# Patient Record
Sex: Female | Born: 1935 | Race: White | Hispanic: No
Health system: Midwestern US, Community
[De-identification: ages and names within clinical notes are randomized; demographics above are authoritative.]

## PROBLEM LIST (undated history)

## (undated) DIAGNOSIS — R112 Nausea with vomiting, unspecified: Secondary | ICD-10-CM

## (undated) DIAGNOSIS — K2950 Unspecified chronic gastritis without bleeding: Secondary | ICD-10-CM

## (undated) DIAGNOSIS — N368 Other specified disorders of urethra: Secondary | ICD-10-CM

## (undated) DIAGNOSIS — Z1239 Encounter for other screening for malignant neoplasm of breast: Secondary | ICD-10-CM

## (undated) DIAGNOSIS — D131 Benign neoplasm of stomach: Secondary | ICD-10-CM

## (undated) DIAGNOSIS — N814 Uterovaginal prolapse, unspecified: Secondary | ICD-10-CM

## (undated) MED ORDER — CYANOCOBALAMIN 1,000 MCG/ML IJ SOLN
1000 mcg/mL | INTRAMUSCULAR | Status: DC
Start: ? — End: 2013-12-03

## (undated) MED ORDER — PANTOPRAZOLE 40 MG TAB, DELAYED RELEASE
40 mg | ORAL_TABLET | Freq: Every day | ORAL | Status: DC
Start: ? — End: 2013-10-14

## (undated) MED ORDER — L-METHYLFOLATE-VITAMIN B12-ACETYLCYSTEINE 5.6 MG-2 MG-600 MG TAB
600-2-6 mg | Freq: Every day | ORAL | Status: DC
Start: ? — End: 2013-11-06

## (undated) MED ORDER — PAROXETINE 20 MG TAB
20 mg | ORAL_TABLET | Freq: Every day | ORAL | Status: DC
Start: ? — End: 2013-11-25

## (undated) MED ORDER — CIPROFLOXACIN 250 MG TAB
250 mg | ORAL_TABLET | Freq: Two times a day (BID) | ORAL | Status: DC
Start: ? — End: 2013-07-28

## (undated) MED ORDER — CIPROFLOXACIN 250 MG TAB
250 mg | ORAL_TABLET | Freq: Two times a day (BID) | ORAL | Status: DC
Start: ? — End: 2013-10-14

## (undated) MED ORDER — DEXTROMETHORPHAN-GUAIFENESIN SR 30 MG-600 MG 12 HR TAB
600-30 mg | ORAL_TABLET | Freq: Two times a day (BID) | ORAL | Status: DC
Start: ? — End: 2013-01-23

## (undated) MED ORDER — ESTRADIOL 0.01% (0.1 MG/G) VAGINAL CREAM
0.01 % (0.1 mg/gram) | VAGINAL | Status: DC
Start: ? — End: 2013-12-18

## (undated) MED ORDER — ERGOCALCIFEROL (VITAMIN D2) 50,000 UNIT CAP
1250 mcg (50,000 unit) | ORAL_CAPSULE | ORAL | Status: DC
Start: ? — End: 2013-06-30

---

## 2009-09-11 ENCOUNTER — Ambulatory Visit: Payer: Self-pay | Admitting: Internal Medicine

## 2009-09-20 ENCOUNTER — Ambulatory Visit: Payer: Self-pay | Admitting: Internal Medicine

## 2010-01-04 ENCOUNTER — Ambulatory Visit: Payer: Self-pay | Admitting: Internal Medicine

## 2010-04-21 ENCOUNTER — Ambulatory Visit: Payer: Self-pay | Admitting: Internal Medicine

## 2010-04-28 ENCOUNTER — Ambulatory Visit: Payer: Self-pay | Admitting: Internal Medicine

## 2010-05-04 ENCOUNTER — Ambulatory Visit: Payer: Self-pay | Admitting: Internal Medicine

## 2011-05-11 ENCOUNTER — Ambulatory Visit: Payer: Self-pay | Admitting: Internal Medicine

## 2011-05-31 ENCOUNTER — Ambulatory Visit: Payer: Self-pay | Admitting: Family Medicine

## 2011-06-14 ENCOUNTER — Ambulatory Visit: Payer: Self-pay | Admitting: Family Medicine

## 2011-07-24 ENCOUNTER — Ambulatory Visit: Payer: Self-pay | Admitting: Gastroenterology

## 2012-02-28 ENCOUNTER — Observation Stay: Payer: Self-pay | Admitting: Internal Medicine

## 2012-02-28 LAB — CK TOTAL AND CKMB (NOT AT ARMC)
CK, Total: 37 U/L (ref 21–215)
CK-MB: 0.5 ng/mL (ref 0.5–3.6)
CK-MB: 0.8 ng/mL (ref 0.5–3.6)

## 2012-02-28 LAB — CBC
HCT: 40.5 % (ref 35.0–47.0)
HGB: 14.1 g/dL (ref 12.0–16.0)
MCHC: 34.9 g/dL (ref 32.0–36.0)
MCV: 86 fL (ref 80–100)

## 2012-02-28 LAB — T4, FREE: Free Thyroxine: 2.14 ng/dL — ABNORMAL HIGH (ref 0.76–1.46)

## 2012-02-28 LAB — BASIC METABOLIC PANEL
Anion Gap: 10 (ref 7–16)
Co2: 20 mmol/L — ABNORMAL LOW (ref 21–32)
Creatinine: 1.21 mg/dL (ref 0.60–1.30)
EGFR (African American): 49 — ABNORMAL LOW
EGFR (Non-African Amer.): 42 — ABNORMAL LOW
Glucose: 99 mg/dL (ref 65–99)
Osmolality: 288 (ref 275–301)
Sodium: 143 mmol/L (ref 136–145)

## 2012-02-28 LAB — TROPONIN I
Troponin-I: <0.02 ng/mL
Troponin-I: <0.02 ng/mL

## 2012-02-29 LAB — CBC WITH DIFFERENTIAL/PLATELET
Basophil #: 0 10*3/uL (ref 0.0–0.1)
Eosinophil %: 2.1 %
HCT: 37.5 % (ref 35.0–47.0)
Lymphocyte #: 1.3 10*3/uL (ref 1.0–3.6)
Lymphocyte %: 32.6 %
MCH: 30.1 pg (ref 26.0–34.0)
MCV: 86 fL (ref 80–100)
Monocyte %: 12.9 %
Neutrophil %: 51.9 %
RDW: 12.9 % (ref 11.5–14.5)

## 2012-02-29 LAB — MAGNESIUM: Magnesium: 2 mg/dL

## 2012-02-29 LAB — BASIC METABOLIC PANEL
Anion Gap: 6 — ABNORMAL LOW (ref 7–16)
Calcium, Total: 8.1 mg/dL — ABNORMAL LOW (ref 8.5–10.1)
Chloride: 112 mmol/L — ABNORMAL HIGH (ref 98–107)
Co2: 25 mmol/L (ref 21–32)
Creatinine: 0.83 mg/dL (ref 0.60–1.30)
Glucose: 82 mg/dL (ref 65–99)
Osmolality: 286 (ref 275–301)
Potassium: 4.3 mmol/L (ref 3.5–5.1)

## 2012-02-29 LAB — CK TOTAL AND CKMB (NOT AT ARMC): CK, Total: 35 U/L (ref 21–215)

## 2012-02-29 LAB — TROPONIN I: Troponin-I: <0.02 ng/mL

## 2012-06-19 ENCOUNTER — Ambulatory Visit: Payer: Self-pay

## 2012-08-20 ENCOUNTER — Ambulatory Visit: Payer: Self-pay | Admitting: Gastroenterology

## 2012-09-23 ENCOUNTER — Ambulatory Visit: Payer: Self-pay | Admitting: Gastroenterology

## 2012-09-26 ENCOUNTER — Ambulatory Visit: Payer: Self-pay

## 2012-10-08 LAB — PATHOLOGY REPORT

## 2012-10-11 ENCOUNTER — Ambulatory Visit: Payer: Self-pay | Admitting: Oncology

## 2012-10-11 LAB — COMPREHENSIVE METABOLIC PANEL
Alkaline Phosphatase: 93 U/L (ref 50–136)
Anion Gap: 7 (ref 7–16)
Calcium, Total: 8.6 mg/dL (ref 8.5–10.1)
Co2: 27 mmol/L (ref 21–32)
EGFR (African American): 51 — ABNORMAL LOW
EGFR (Non-African Amer.): 44 — ABNORMAL LOW
Osmolality: 290 (ref 275–301)
SGOT(AST): 22 U/L (ref 15–37)
SGPT (ALT): 17 U/L (ref 12–78)
Sodium: 145 mmol/L (ref 136–145)

## 2012-10-11 LAB — CBC CANCER CENTER
Basophil #: 0 x10 3/mm (ref 0.0–0.1)
HCT: 41.5 % (ref 35.0–47.0)
HGB: 14.2 g/dL (ref 12.0–16.0)
Lymphocyte %: 22.9 %
MCH: 29.3 pg (ref 26.0–34.0)
MCHC: 34.2 g/dL (ref 32.0–36.0)
MCV: 86 fL (ref 80–100)
Monocyte #: 0.4 x10 3/mm (ref 0.2–0.9)
Monocyte %: 8.6 %
Neutrophil #: 2.8 x10 3/mm (ref 1.4–6.5)
Neutrophil %: 67.4 %
Platelet: 229 x10 3/mm (ref 150–440)
RDW: 13.6 % (ref 11.5–14.5)
WBC: 4.2 x10 3/mm (ref 3.6–11.0)

## 2012-10-26 ENCOUNTER — Ambulatory Visit: Payer: Self-pay | Admitting: Oncology

## 2012-11-14 ENCOUNTER — Inpatient Hospital Stay: Payer: Self-pay | Admitting: Orthopedic Surgery

## 2012-11-14 LAB — PROTIME-INR
INR: 1
Prothrombin Time: 13.1 secs (ref 11.5–14.7)

## 2012-11-14 LAB — BASIC METABOLIC PANEL
Calcium, Total: 8.1 mg/dL — ABNORMAL LOW (ref 8.5–10.1)
Chloride: 112 mmol/L — ABNORMAL HIGH (ref 98–107)
Co2: 21 mmol/L (ref 21–32)
Creatinine: 0.87 mg/dL (ref 0.60–1.30)
EGFR (African American): >60
EGFR (Non-African Amer.): >60
Potassium: 3.8 mmol/L (ref 3.5–5.1)
Sodium: 144 mmol/L (ref 136–145)

## 2012-11-14 LAB — CBC WITH DIFFERENTIAL/PLATELET
Basophil #: 0 10*3/uL (ref 0.0–0.1)
Basophil %: 0.2 %
Eosinophil #: 0 10*3/uL (ref 0.0–0.7)
Eosinophil %: 0.5 %
HCT: 39.8 % (ref 35.0–47.0)
Lymphocyte #: 0.8 10*3/uL — ABNORMAL LOW (ref 1.0–3.6)
Lymphocyte %: 9.2 %
MCV: 86 fL (ref 80–100)
Monocyte %: 6.4 %
Neutrophil #: 7.4 10*3/uL — ABNORMAL HIGH (ref 1.4–6.5)
Neutrophil %: 83.7 %
Platelet: 223 10*3/uL (ref 150–440)
RDW: 13.4 % (ref 11.5–14.5)

## 2012-11-14 LAB — CK TOTAL AND CKMB (NOT AT ARMC)
CK, Total: 60 U/L (ref 21–215)
CK-MB: <0.5 ng/mL — ABNORMAL LOW (ref 0.5–3.6)

## 2012-11-14 LAB — TROPONIN I: Troponin-I: <0.02 ng/mL

## 2012-11-14 LAB — TSH: Thyroid Stimulating Horm: 3.08 u[IU]/mL

## 2012-11-15 LAB — URINALYSIS, COMPLETE
Bilirubin,UR: NEGATIVE
Glucose,UR: NEGATIVE mg/dL (ref 0–75)
Nitrite: POSITIVE
Protein: 30
RBC,UR: 161 /HPF (ref 0–5)

## 2012-11-15 LAB — BASIC METABOLIC PANEL
Anion Gap: 8 (ref 7–16)
Chloride: 107 mmol/L (ref 98–107)
Creatinine: 0.7 mg/dL (ref 0.60–1.30)
EGFR (African American): >60
EGFR (Non-African Amer.): >60
Osmolality: 272 (ref 275–301)
Potassium: 3.6 mmol/L (ref 3.5–5.1)

## 2012-11-15 LAB — CBC WITH DIFFERENTIAL/PLATELET
Basophil #: 0 10*3/uL (ref 0.0–0.1)
Basophil %: 0.4 %
Eosinophil %: 2 %
HCT: 35.4 % (ref 35.0–47.0)
Lymphocyte #: 0.9 10*3/uL — ABNORMAL LOW (ref 1.0–3.6)
Lymphocyte %: 14.4 %
Monocyte %: 8.7 %
Neutrophil #: 4.4 10*3/uL (ref 1.4–6.5)
Neutrophil %: 74.5 %
Platelet: 167 10*3/uL (ref 150–440)
RDW: 13.2 % (ref 11.5–14.5)

## 2012-11-15 LAB — HEMOGLOBIN: HGB: 10.8 g/dL — ABNORMAL LOW (ref 12.0–16.0)

## 2012-11-16 LAB — CBC WITH DIFFERENTIAL/PLATELET
Basophil #: 0 10*3/uL (ref 0.0–0.1)
Basophil %: 0.2 %
Eosinophil %: 0.1 %
HCT: 28.2 % — ABNORMAL LOW (ref 35.0–47.0)
Lymphocyte %: 9.9 %
MCHC: 35.2 g/dL (ref 32.0–36.0)
MCV: 85 fL (ref 80–100)
Monocyte #: 0.7 x10 3/mm (ref 0.2–0.9)
Monocyte %: 8.9 %
Neutrophil #: 6.7 10*3/uL — ABNORMAL HIGH (ref 1.4–6.5)
RBC: 3.32 10*6/uL — ABNORMAL LOW (ref 3.80–5.20)
RDW: 13.9 % (ref 11.5–14.5)
WBC: 8.3 10*3/uL (ref 3.6–11.0)

## 2012-11-16 LAB — BASIC METABOLIC PANEL
BUN: 16 mg/dL (ref 7–18)
Calcium, Total: 7 mg/dL — CL (ref 8.5–10.1)
Chloride: 108 mmol/L — ABNORMAL HIGH (ref 98–107)
EGFR (African American): 54 — ABNORMAL LOW
Glucose: 135 mg/dL — ABNORMAL HIGH (ref 65–99)
Osmolality: 275 (ref 275–301)
Potassium: 4.1 mmol/L (ref 3.5–5.1)
Sodium: 136 mmol/L (ref 136–145)

## 2012-11-17 LAB — BASIC METABOLIC PANEL
Anion Gap: 8 (ref 7–16)
Calcium, Total: 7.2 mg/dL — ABNORMAL LOW (ref 8.5–10.1)
Chloride: 106 mmol/L (ref 98–107)
Creatinine: 0.94 mg/dL (ref 0.60–1.30)
Osmolality: 268 (ref 275–301)
Sodium: 133 mmol/L — ABNORMAL LOW (ref 136–145)

## 2012-11-17 LAB — HEPATIC FUNCTION PANEL A (ARMC)
Alkaline Phosphatase: 79 U/L (ref 50–136)
Bilirubin, Direct: 0.3 mg/dL — ABNORMAL HIGH (ref 0.00–0.20)
Bilirubin,Total: 0.7 mg/dL (ref 0.2–1.0)
Total Protein: 4.4 g/dL — ABNORMAL LOW (ref 6.4–8.2)

## 2012-11-17 LAB — HEMOGLOBIN: HGB: 9.6 g/dL — ABNORMAL LOW (ref 12.0–16.0)

## 2012-11-18 LAB — CBC WITH DIFFERENTIAL/PLATELET
Basophil #: 0 10*3/uL (ref 0.0–0.1)
Basophil %: 0.1 %
Eosinophil #: 0.1 10*3/uL (ref 0.0–0.7)
Eosinophil %: 2.5 %
HCT: 25.1 % — ABNORMAL LOW (ref 35.0–47.0)
MCH: 29 pg (ref 26.0–34.0)
MCHC: 34.6 g/dL (ref 32.0–36.0)
MCV: 84 fL (ref 80–100)
Monocyte %: 14.7 %
Platelet: 135 10*3/uL — ABNORMAL LOW (ref 150–440)
RDW: 13.6 % (ref 11.5–14.5)
WBC: 5.2 10*3/uL (ref 3.6–11.0)

## 2012-12-24 NOTE — Patient Instructions (Signed)
How to Get up Safely After a Fall: After Your Visit  Your Care Instructions  If you have injuries, health problems, or other reasons that may make it easy for you to fall at home, it is a good idea to learn how to get up safely after a fall. Learning how to get up correctly can help you avoid making an injury worse.  Also, knowing what to do if you cannot get up can help you stay safe until help arrives.  Follow-up care is a key part of your treatment and safety. Be sure to make and go to all appointments, and call your doctor if you are having problems. It's also a good idea to know your test results and keep a list of the medicines you take.  How can you care for yourself after a fall?  If you think you can get up  First lie still for a few minutes and think about how you feel. If your body feels okay and you think you can get up safely, follow the rest of the steps below:  1. Look for a chair or other piece of furniture that is close to you.  2. Roll onto your side and rest. Roll by turning your head in the direction you want to roll, move your shoulder and arm, then hip and leg in the same direction.  3. Lie still for a moment to let your blood pressure adjust.  4. Slowly push your upper body up, lift your head, and take a moment to rest.  5. Slowly get up on your hands and knees, and crawl to the chair or other stable piece of furniture.  6. Put your hands on the chair.  7. Move one foot forward, and place it flat on the floor. Your other leg should be bent with the knee on the floor.  8. Rise slowly, turn your body, and sit in the chair. Stay seated for a bit and think about how you feel. Call for help. Even if you feel okay, let someone know what happened to you. You might not know that you have a serious injury.  If you cannot get up  1. If you think you are injured after a fall or you cannot get up, try not to panic.  2. Call out for help.  3. If you have a phone within reach or you have an emergency call  device, use it to call for help.  4. If you do not have a phone within reach, try to slide yourself toward it. If you cannot get to the phone, try to slide toward a door or window or a place where you think you can be heard.  5. Yell or use an object to make noise so someone might hear you.  6. If you can reach something that you can use for a pillow, place it under your head. Try to stay warm by covering yourself with a blanket or clothing while you wait for help.  When should you call for help?  Call 911 anytime you think you may need emergency care. For example, call if:  ?? You passed out (lost consciousness).  ?? You cannot get up after a fall.  ?? You believe you have serious or life-threatening injuries.  Call your doctor now or seek immediate medical care if:  ?? You have severe pain.  ?? You think you may have passed out but are not sure.  ?? You hit your head or think   you may have hit your head but are not sure.  ?? You think your medicines may have caused you to fall.  Watch closely for changes in your health, and be sure to contact your doctor if:  ?? You have fallen, even if you think you are not hurt. Do not feel embarrassed to let your doctor know you have fallen. Your doctor may be able to adjust your medicines or provide other help so you can prevent future falls.   Where can you learn more?   Go to http://www.healthwise.net/[facility]  Enter G513 in the search box to learn more about "How to Get up Safely After a Fall: After Your Visit."   ?? 2006-2014 Healthwise, Inc[employer]. Care instructions adapted under license by [facility] (which disclaims liability or warranty for this information). This care instruction is for use with your licensed healthcare professional. If you have questions about a medical condition or this instruction, always ask your healthcare professional. Healthwise, Incorporated disclaims any warranty or liability for your use of this information.  Content Version: 10.0.270728; Last  Revised: April 30, 2012

## 2012-12-24 NOTE — Progress Notes (Signed)
[facility]  Bran Aldridge C. Jo Simmons, M.D.  Jo Simmons, M.D.  [address]  [place],  [place] [address]  Ph No:  [phone]  Fax:  [phone]      CHIEF COMPLAINT:  New Patient visit         HISTORY OF PRESENT ILLNESS: Jo Simmons is a 79 y.o. female with Past medical history positive for memory loss, depression, hypothyroidism, history of multiple neuroendocrine tumors and stomach, osteoarthritis, gastritis, COPD, cardiomegaly, pernicious anemia, GERD, heart, or hernia.  History of recent fractured and currently in a rehabilitation facility was advised to get established primary care physician.  According to daughter, she believed in [place] recently moved to [place].  Daughter has been the primary caretaker for her and they would like to get established with primary care physician.  She has a history of fractured off her  Right hip hemiarthroplasty.    HISTORY:  No Known Allergies  Past Medical History   Diagnosis Date   ??? Anemia    ??? Chronic obstructive pulmonary disease    ??? Thyroid disease    ??? GERD (gastroesophageal reflux disease)    ??? Depression    ??? Arthritis      Past Surgical History   Procedure Laterality Date   ??? Hx hip replacement       right side   ??? Hx endoscopy     ??? Hx urological     ??? Hx orthopaedic     ??? Hx gi     ??? Pr cardiac surg procedure unlist       Family History   Problem Relation Age of Onset   ??? Alcohol abuse Father      History     Social History   ??? Marital Status: WIDOWED     Spouse Name: N/A     Number of Children: N/A   ??? Years of Education: N/A     Occupational History   ??? Not on file.     Social History Main Topics   ??? Smoking status: Never Smoker    ??? Smokeless tobacco: Not on file   ??? Alcohol Use: Yes      Comment: social   ??? Drug Use: No   ??? Sexually Active: Not on file     Other Topics Concern   ??? Not on file     Social History Narrative   ??? No narrative on file     Current Outpatient Prescriptions    Medication Sig Dispense Refill   ??? alendronate (FOSAMAX) 70 mg tablet Take 70 mg by mouth every Wednesday.       ??? omeprazole (PRILOSEC) 20 mg capsule Take 20 mg by mouth daily.       ??? phenazopyridine (PYRIDIUM) 100 mg tablet Take 100 mg by mouth two (2) times a day.       ??? magnesium hydroxide (PHILLIPS MILK OF MAGNESIA) 400 mg/5 mL suspension Take 30 mL by mouth daily as needed for Constipation.       ??? ondansetron hcl (ZOFRAN) 4 mg tablet Take 4 mg by mouth every eight (8) hours as needed for Nausea.       ??? polyethylene glycol (MIRALAX) 17 gram packet Take 17 g by mouth daily.       ??? acetaminophen (TYLENOL ARTHRITIS PAIN) 650 mg CR tablet Take 650 mg by mouth every six (6) hours as needed for Pain.       ???  bisacodyl (DULCOLAX) 10 mg suppository Insert 10 mg into rectum daily.       ??? calcium carbonate (CALTREX) 600 mg (1,500 mg) tablet Take 600 mg by mouth two (2) times a day.       ??? Cholecalciferol, Vitamin D3, 50,000 unit cap Take  by mouth.       ??? cyanocobalamin (VITAMIN B12) 1,000 mcg/mL injection 1,000 mcg by IntraMUSCular route every thirty (30) days.       ??? docusate calcium (SURFAK) 240 mg capsule Take 240 mg by mouth nightly as needed for Constipation.       ??? donepezil (ARICEPT) 5 mg tablet Take  by mouth nightly.       ??? enoxaparin (LOVENOX) 40 mg/0.4 mL 40 mg by SubCUTAneous route daily.       ??? ferrous sulfate 325 mg (65 mg iron) tablet Take 325 mg by mouth two (2) times a day.       ??? HYDROcodone-acetaminophen (NORCO) 5-325 mg per tablet Take  by mouth.       ??? albuterol-ipratropium (DUO-NEB) 2.5 mg-0.5 mg/3 ml nebulizer solution 3 mL by Nebulization route once. As needed.       ??? levothyroxine (SYNTHROID) 50 mcg tablet Take  by mouth Daily (before breakfast).       ??? MULTIVITAMIN WITH MINERALS (ONE-A-DAY 50 PLUS PO) Take  by mouth.       ??? PARoxetine (PAXIL) 20 mg tablet Take  by mouth daily.                 REVIEW OF SYSTEMS:    GENERAL/CONSTITUTIONAL: Negative for  - chills, fatigue, fever,  night sweats, sleep disturbance, weight gain, weight loss  HEAD, EYES, EARS, NOSE AND THROAT: Negative for - epistaxis, headaches, hearing change, nasal congestion, nasal discharge, oral lesions, sinus pain, sneezing, sore throat, tinnitus, vertigo, visual changes, vocal changes  CARDIOVASCULAR: Negative for - chest pain, edema, irregular heartbeat, loss of consciousness, orthopnea, palpitations, paroxysmal nocturnal dyspnea, rapid heart rate, shortness of breath on exertion.  RESPIRATORY:  Negative for - cough, hemoptysis, orthopnea, pleuritic pain, shortness of breath, sputum changes, stridor, tachypnea, wheezing  GASTROINTESTINAL: Negative for - abdominal pain, appetite loss, blood in stools, change in bowel habits, change in stools, constipation, diarrhea, gas/bloating, heartburn, hematemesis, melena, nausea/vomiting, stool incontinence, swallowing difficulty/pain  GENITOURINARY: Negative for - Urinary frequency and uti recurrent   MUSCULOSKELETAL: right hip pain, improving, getting physical therapy at a rehabilitation facility  SKIN:  Negative for - acne, dry skin, eczema, hair changes, lumps, mole changes, nail changes, pruritus, rash, skin lesion changes  NEUROLOGIC: Negative for - behavioral changes, bowel and bladder control changes, confusion, dizziness, gait disturbance, headaches, impaired coordination/balance, memory loss, numbness/tingling, seizures, speech problems, tremors, visual changes, weakness  PSYCHIATRIC: Negative for - anxiety, behavioral disorder, concentration difficulties, depression, disorientation, hallucinations, irritability, memory difficulties, mood swings, obsessive thoughts, physical abuse, sexual abuse, sleep disturbances, suicidal ideation  ENDOCRINE:  Negative for - breast changes, galactorrhea, hair pattern changes, hot flashes, malaise/lethargy, mood swings, palpitations, polydipsia/polyuria, skin changes, temperature intolerance, unexpected weight changes   HEMATOLOGIC/LYMPHATIC:  Negative for - bleeding problems, blood clots, blood transfusions, bruising, fatigue, jaundice, night sweats, pallor, swollen lymph nodes, weight loss   ALLERGIC/IMMUNOLOGIC:  Negative for - hives, insect bite sensitivity, itchy/watery eyes, nasal congestion, postnasal drip, seasonal allergies             PHYSICAL EXAM:  Vital Signs - BP 126/81   Pulse 86   Ht 5\' 7"  (1.702 m)  Wt 143 lb (64.864 kg)   BMI 22.39 kg/m2   Constitutional - alert, well appearing, and in no distress. Uses walker for ambulation  Eyes - pupils equal and reactive, extraocular eye movements intact.  Ear, Nose, Mouth, Throat - external inspection of ears and nose is normal   Neck - supple, no significant adenopathy. No thyromegaly.  Respiratory - clear to auscultation, no wheezes, rales or bronchi, symmetric air entry.  Cardiovascular - normal rate, regular rhythm, normal S1, S2, systolic 2/6 murmurs, rubs, clicks or gallops, pedal pulses, extremities for edema and/or varicosities.  Gastrointestinal - Abdomen soft, non tender, non distended, no masses.  Examination of liver and spleen    Back exam - full range of motion, no tenderness, palpable spasm or pain on motion  Lymphatic - No lymphadenopathy noted.  Musculoskeletal - No joint tenderness, deformity or swelling. Right hip.  Range of motion appeared to be almost normal  Skin - normal coloration and turgor, no rashes, no suspicious skin lesions noted. Several SK lesions evident  Neurological - Cranial nerves with notation of any deficits. Motor sensory and gait is normal . Speech is normal  Extremities - peripheral pulses normal, no pedal edema, no clubbing or cyanosis  Psychiatric - alert, oriented to person, place, and time.    LABS  No results found for this or any previous visit.            IMPRESSION  Encounter Diagnoses     ICD-9-CM   1. Hypothyroidism 244.9   2. B12 deficiency 266.2   3. Depression 311   4. Hip fx 820.8   5. COPD (chronic obstructive  pulmonary disease) 496   6. Esophageal reflux 530.81   7. DJD (degenerative joint disease) 715.90       PLAN :   I discussed with family.  Considering her fracture and been on Fosamax for a long time best option is to stop the therapy also explained about continuing her medication.  She is also advised to have B12 injection once a month.  She will continue with her current medication.  Her appetite has been poor, probably change in her status, moving from under [place] as well as recent surgery may be playing a role.  Advised that we could switch her over to paxil to remeron, depending on how she improves she will stay with the low-salt, low-cholesterol diet.  Continue his physical therapy follow-up was recommended.  We reviewed her medication.  Discussed regarding her conditions in detail.  Continue current medication.  Under care by dr. Rema Jasmine at rehabilitation facility.  Continue physical therapy, occupational therapy as needed.  Continue Colace as needed.  She had a blood work done at the facility, but I do not see the results.  Advised daughter to bring those results to [place] during next visit.  Follow-up is recommended.  See orders and medication list                Jo Kern, MD          Dictated using voice recognition software. Proofread, but unrecognized voice recognition errors may exist.

## 2013-01-02 ENCOUNTER — Other Ambulatory Visit

## 2013-01-02 LAB — AMB POC COMPREHENSIVE METABOLIC PANEL
ALBUMIN: 3.7 g/dL (ref 3.2–5.5)
ALKALINE PHOS: 93 IU/L (ref 42–128)
ALT (POC): 16 IU/L (ref 10–60)
AST (POC): 24 IU/L (ref 10–42)
BUN: 13 mg/dL (ref 7–18)
CALCIUM: 9.5 mg/dL (ref 8–10.4)
CHLORIDE: 106.7 mmol/L (ref 96–118)
CO2: 26.7 mmol/L (ref 21–35)
Creatinine (POC): 0.9 mg/dL (ref 0.6–1.3)
GLUCOSE: 92 mg/dL (ref 70–105)
Potassium (POC): 4.39 mmol/L (ref 3.5–5.1)
Sodium (POC): 141.8 mmol/L (ref 134–146)
TOTAL BILIRUBIN: 0.8 mg/dL (ref 0.2–1)
TOTAL PROTEIN: 6 g/dL — AB (ref 6.1–8.2)

## 2013-01-02 LAB — AMB POC LIPID PROFILE
Cholesterol (POC): 188
HDL Cholesterol (POC): 52.1
LDL Cholesterol (POC): 103
TChol/HDL Ratio (POC): 1.6
Triglycerides (POC): 164

## 2013-01-02 LAB — AMB POC TSH: TSH POC: 3.05 u[IU]/mL (ref 0.54–5.2)

## 2013-01-02 LAB — AMB POC T4: T4 POC: 8.76 ug/dl (ref 4.9–12)

## 2013-01-02 LAB — AMB POC HELICOBACTER PYLORI: H. pylori (POC): NEGATIVE

## 2013-01-03 LAB — MAGNESIUM: Magnesium: 2.2 mg/dL (ref 1.6–2.6)

## 2013-01-03 LAB — VITAMIN B12: Vitamin B12: 516 pg/mL (ref 211–946)

## 2013-01-03 NOTE — Progress Notes (Signed)
Sent referral electronically to [facility]

## 2013-01-03 NOTE — Progress Notes (Signed)
CHIEF COMPLAINT:   pmb    HISTORY OF PRESENT ILLNESS:   79 yo wf for new pt visit. In rehab for last few months due to hip fxn, noted pmb and seen by gyn.  Noted vaginal lesion, referred.  No further wu to this point, bleeding light.  No hx gyn surgery.    Review of Systems: except as above.  Constitutional Denies fever or chills.  Denies weight loss or appetite changes.   ENT and Mouth Denies hearing loss, sinusitis, sore throat, oral cavities, ulcers, tinnitus, headache   Gastrointestinal Denies nausea, vomiting, bowel changes.  Denies bloody or black stools.   Integumentary Denies lesions or rashes, moles, dryness, lumps, pigmentation, mastalgia, discharge, masses, ulcers   Endocrine Denies polyuria, polydipsia, cold - heat intolerance, diabetes, hot flashes, hypothyroid, hyperthyroid    Genitourinary Denies hematuria, nocturia, menopause, hernia, dysuria, urgency, frequency, incomplete emptying, abnormal bleeding   Hem / Lymph Denies anemia, bruising, bleeding, lymph node enlargement, adnopathy    Eyes Denies diplopia, blurred vision, vision change, glasses / contacts   Cardiovascular Denies chest pain or pressure, palpitations, murmur, hypertension, orthopenea   Musculoskeletal  Denies arthritis, joint stiffness, swelling, myalgias, gout, weakness   Neurologic Denies dizziness, syncope, seizures, vertigo, weakness, tremor, numbness, trouble walking   Respiratory Denies shortness of breath, cough, sputum production.   Allergy / Immunologic  Denies allergies to medicine, food dyes, hepatitis, HIV   Psychiatric Denies depression, agitation, panic-anxiety, memory disturbance       No Known Allergies  Past Medical History   Diagnosis Date   ??? Anemia    ??? Chronic obstructive pulmonary disease    ??? GERD (gastroesophageal reflux disease)    ??? Depression    ??? Arthritis    ??? Thyroid disease      hypothyroid   ??? Congestive heart failure, unspecified    ??? Cardiomegaly    ??? HH (hiatus hernia)    ??? Psychotic disorder       memory loss- short term     Past Surgical History   Procedure Laterality Date   ??? Hx hip replacement       right side   ??? Hx endoscopy     ??? Hx urological     ??? Hx gi     ??? Pr cardiac surg procedure unlist     ??? Hx orthopaedic       right hip replacement   ??? Hx appendectomy     ??? Hx tonsillectomy       Family History   Problem Relation Age of Onset   ??? Alcohol abuse Father      History     Social History   ??? Marital Status: UNKNOWN     Spouse Name: N/A     Number of Children: N/A   ??? Years of Education: N/A     Occupational History   ??? Not on file.     Social History Main Topics   ??? Smoking status: Never Smoker    ??? Smokeless tobacco: Not on file   ??? Alcohol Use: Yes      Comment: social   ??? Drug Use: No   ??? Sexually Active: Not on file     Other Topics Concern   ??? Not on file     Social History Narrative   ??? No narrative on file      Current Outpatient Prescriptions   Medication Sig Dispense Refill   ??? alendronate (FOSAMAX) 70 mg tablet  Take 70 mg by mouth every Wednesday.       ??? omeprazole (PRILOSEC) 20 mg capsule Take 20 mg by mouth daily.       ??? phenazopyridine (PYRIDIUM) 100 mg tablet Take 100 mg by mouth two (2) times a day.       ??? magnesium hydroxide (PHILLIPS MILK OF MAGNESIA) 400 mg/5 mL suspension Take 30 mL by mouth daily as needed for Constipation.       ??? ondansetron hcl (ZOFRAN) 4 mg tablet Take 4 mg by mouth every eight (8) hours as needed for Nausea.       ??? polyethylene glycol (MIRALAX) 17 gram packet Take 17 g by mouth daily.       ??? acetaminophen (TYLENOL ARTHRITIS PAIN) 650 mg CR tablet Take 650 mg by mouth every six (6) hours as needed for Pain.       ??? bisacodyl (DULCOLAX) 10 mg suppository Insert 10 mg into rectum daily.       ??? calcium carbonate (CALTREX) 600 mg (1,500 mg) tablet Take 600 mg by mouth two (2) times a day.       ??? Cholecalciferol, Vitamin D3, 50,000 unit cap Take  by mouth.       ??? cyanocobalamin (VITAMIN B12) 1,000 mcg/mL injection 1,000 mcg by IntraMUSCular route every  thirty (30) days.       ??? docusate calcium (SURFAK) 240 mg capsule Take 240 mg by mouth nightly as needed for Constipation.       ??? donepezil (ARICEPT) 5 mg tablet Take  by mouth nightly.       ??? enoxaparin (LOVENOX) 40 mg/0.4 mL 40 mg by SubCUTAneous route daily.       ??? ferrous sulfate 325 mg (65 mg iron) tablet Take 325 mg by mouth two (2) times a day.       ??? HYDROcodone-acetaminophen (NORCO) 5-325 mg per tablet Take  by mouth.       ??? albuterol-ipratropium (DUO-NEB) 2.5 mg-0.5 mg/3 ml nebulizer solution 3 mL by Nebulization route once. As needed.       ??? levothyroxine (SYNTHROID) 50 mcg tablet Take  by mouth Daily (before breakfast).       ??? MULTIVITAMIN WITH MINERALS (ONE-A-DAY 50 PLUS PO) Take  by mouth.       ??? PARoxetine (PAXIL) 20 mg tablet Take  by mouth daily.           OBJECTIVE:  BP 133/84   Pulse 88   Ht 5\' 7"  (1.702 m)   Wt 141 lb (63.957 kg)   BMI 22.08 kg/m2   SpO2 98%    Performance Status:  1    Physical Exam:   General Alerted    Eyes No variations with conjunctivae, lids, pupils, irises    Ears, Nose, mouth, Throat No variations with external, ears, nose, lips, teeth, or gums   Neck No variations with neck and thyroid   Respiratory No variations with respiratory effort, palpitation of chest, or ausculation of lungs   Cardio Regular rate and rhythm, no murmurs, gallops or rubs, normal extremities for edemia / variocosities       Abdomen Soft, non-tender, non-distended; bowel sounds present; no hepatosplenomegaly   Genitourinary (Pelvic examination) Normal external genitalia, vagina, bladder, cervix, uterus, adnexa / parametria, no CVA tenderness  Urethra with erethmatous area, prolapse??   Lymphatic Normal Neck, axilla, groin    Musculoskeletal Normal gait and station. Normal range of motion,assessment of stability, muscle strength / tone, spine NT  Skin Normal skin    Neurologic  Normal cranial nerves    Psychiatric  Alert to time, place and person, place and person, mood and affect        Labs:  Recent Results (from the past 24 hour(s))   MAGNESIUM    Collection Time     01/02/13  8:22 AM       Result Value Range    Magnesium 2.2  1.6 - 2.6 mg/dL   VITAMIN Z30    Collection Time     01/02/13  8:22 AM       Result Value Range    Vitamin B12 516  211 - 946 pg/mL   AMB POC COMPREHENSIVE METABOLIC PANEL    Collection Time     01/02/13  1:00 PM       Result Value Range    Sodium (POC) 141.8  134 - 146 mmol/L    Potassium (POC) 4.39  3.5 - 5.1 mmol/L    CHLORIDE 106.7  96 - 118 mmol/L    CO2 26.7  21 - 35 mmol/L    GLUCOSE 92  70 - 105 mg/dL    BUN 13  7 - 18 mg/dL    CREATININE 0.9  0.6 - 1.3 mg/dL    ALBUMIN 3.7  3.2 - 5.5 g/dL    ALKALINE PHOS 93  42 - 128 IU/L    AST (POC) 24  10 - 42 IU/L    ALT (POC) 16  10 - 60 IU/L    TOTAL BILIRUBIN 0.8  0.2 - 1 mg/dL    TOTAL PROTEIN 6.0 (*) 6.1 - 8.2 g/dL    CALCIUM 9.5  8 - 86.5 mg/dL   AMB POC TSH    Collection Time     01/02/13  1:00 PM       Result Value Range    TSH POC 3.05  0.54 - 5.2 uIU/ml   AMB POC T4    Collection Time     01/02/13  1:00 PM       Result Value Range    T4 POC 8.76  4.9 - 12 ug/dl   AMB POC LIPID PROFILE    Collection Time     01/02/13  1:00 PM       Result Value Range    Cholesterol (POC) 188      Triglycerides (POC) 164      HDL Cholesterol (POC) 52.1      LDL Cholesterol (POC) 103      Non-HDL Goal (POC)        TChol/HDL Ratio (POC) 1.6     AMB POC HELICOBACTER PYLORI    Collection Time     01/02/13  1:00 PM       Result Value Range    H. pylori (POC) NEG           ASSESSMENT:    ICD-9-CM    1. Uterine prolapse 618.1 REFERRAL TO UROLOGY     PAP, LIQUID BASED, IMAGE PROCESSED     Specialty Problems    None            PLAN:  Discussed the patient's BMI with her.  The BMI follow up plan is as follows: Diet and exercise    Impression at this point, non gyn and possibly benign urethral prolapse, referr to urology  Recommended ensring colonosocopy within last few years, proceed iif not  Pap or area done.  [email or web address]  [facility]  [address]  [place] [address]  Office : [phone]  Fax : [phone]

## 2013-01-07 NOTE — Progress Notes (Signed)
[facility], spoke to Justin.  Asked her if she got the electronic referral through [facility].  She did and said they are a week behind on their referrals.  I called the patient's daughter and let her know.

## 2013-01-09 NOTE — Progress Notes (Signed)
[facility]  Johnathin Vanderschaaf C. Allena Katz, M.D.  Deloris Ping, M.D.  [address]  [place], [place] [address]  Ph No:  [phone]  Fax:  [phone]      CHIEF COMPLAINT: follow-up visit         HISTORY OF PRESENT ILLNESS: [name] is a 79 y.o. female with past medical history positive for COPD, GERD, depression, arthritis, hypothyroidism, seen in office today accompanied by her daughter stating that she's been having some coughing and congestion and low-grade fever that Concern her.  She brought her here for evaluation.  She also had a blood work done recently.  She just came home from nursing home yesterday.  No history of any nausea, vomiting, no diarrhea, no blood in the stool or black colored stool      HISTORY:  No Known Allergies  Past Medical History   Diagnosis Date   ??? Anemia    ??? Chronic obstructive pulmonary disease    ??? GERD (gastroesophageal reflux disease)    ??? Depression    ??? Arthritis    ??? Thyroid disease      hypothyroid   ??? Congestive heart failure, unspecified    ??? Cardiomegaly    ??? HH (hiatus hernia)    ??? Psychotic disorder      memory loss- short term     Past Surgical History   Procedure Laterality Date   ??? Hx hip replacement       right side   ??? Hx endoscopy     ??? Hx urological     ??? Hx gi     ??? Pr cardiac surg procedure unlist     ??? Hx orthopaedic       right hip replacement   ??? Hx appendectomy     ??? Hx tonsillectomy       Family History   Problem Relation Age of Onset   ??? Alcohol abuse Father      History     Social History   ??? Marital Status: UNKNOWN     Spouse Name: N/A     Number of Children: N/A   ??? Years of Education: N/A     Occupational History   ??? Not on file.     Social History Main Topics   ??? Smoking status: Never Smoker    ??? Smokeless tobacco: Not on file   ??? Alcohol Use: Yes      Comment: social   ??? Drug Use: No   ??? Sexually Active: Not on file     Other Topics Concern   ??? Not on file     Social History Narrative   ??? No narrative on file      Current Outpatient Prescriptions   Medication Sig Dispense Refill   ??? ergocalciferol (ERGOCALCIFEROL) 50,000 unit capsule Take 50,000 Units by mouth every seven (7) days.       ??? traMADol (ULTRAM) 50 mg tablet Take 50 mg by mouth every six (6) hours as needed for Pain.       ??? sucralfate (CARAFATE) 1 gram tablet Take 1 g by mouth two (2) times a day.       ??? oxybutynin (GELNIQUE) 28 mg/0.92 gram (3 %) glpm by TransDERmal route daily.       ??? estradiol (ESTRACE) 0.01 % (0.1 mg/gram) vaginal cream Insert 2 g into vagina. mon wed  fri       ??? nitrofurantoin, macrocrystal-monohydrate, (MACROBID) 100  mg capsule Take 200 mg by mouth two (2) times a day.       ??? docusate sodium (COLACE) 100 mg capsule Take 100 mg by mouth two (2) times a day.       ??? alendronate (FOSAMAX) 70 mg tablet Take 70 mg by mouth every Wednesday.       ??? omeprazole (PRILOSEC) 20 mg capsule Take 20 mg by mouth daily.       ??? calcium carbonate (CALTREX) 600 mg (1,500 mg) tablet Take 600 mg by mouth two (2) times a day.       ??? cyanocobalamin (VITAMIN B12) 1,000 mcg/mL injection 1,000 mcg by IntraMUSCular route every thirty (30) days.       ??? donepezil (ARICEPT) 5 mg tablet Take  by mouth nightly.       ??? ferrous sulfate 325 mg (65 mg iron) tablet Take 325 mg by mouth two (2) times a day.       ??? levothyroxine (SYNTHROID) 50 mcg tablet Take  by mouth Daily (before breakfast).       ??? MULTIVITAMIN WITH MINERALS (ONE-A-DAY 50 PLUS PO) Take  by mouth.       ??? PARoxetine (PAXIL) 20 mg tablet Take  by mouth daily.           MEDICATIONS:  Current outpatient prescriptions:ergocalciferol (ERGOCALCIFEROL) 50,000 unit capsule, Take 50,000 Units by mouth every seven (7) days., Disp: , Rfl: ;  traMADol (ULTRAM) 50 mg tablet, Take 50 mg by mouth every six (6) hours as needed for Pain., Disp: , Rfl: ;  sucralfate (CARAFATE) 1 gram tablet, Take 1 g by mouth two (2) times a day., Disp: , Rfl: ;  oxybutynin (GELNIQUE) 28 mg/0.92 gram (3 %) glpm, by TransDERmal  route daily., Disp: , Rfl:   estradiol (ESTRACE) 0.01 % (0.1 mg/gram) vaginal cream, Insert 2 g into vagina. mon wed  fri, Disp: , Rfl: ;  nitrofurantoin, macrocrystal-monohydrate, (MACROBID) 100 mg capsule, Take 200 mg by mouth two (2) times a day., Disp: , Rfl: ;  docusate sodium (COLACE) 100 mg capsule, Take 100 mg by mouth two (2) times a day., Disp: , Rfl: ;  alendronate (FOSAMAX) 70 mg tablet, Take 70 mg by mouth every Wednesday., Disp: , Rfl:   omeprazole (PRILOSEC) 20 mg capsule, Take 20 mg by mouth daily., Disp: , Rfl: ;  calcium carbonate (CALTREX) 600 mg (1,500 mg) tablet, Take 600 mg by mouth two (2) times a day., Disp: , Rfl: ;  cyanocobalamin (VITAMIN B12) 1,000 mcg/mL injection, 1,000 mcg by IntraMUSCular route every thirty (30) days., Disp: , Rfl: ;  donepezil (ARICEPT) 5 mg tablet, Take  by mouth nightly., Disp: , Rfl:   ferrous sulfate 325 mg (65 mg iron) tablet, Take 325 mg by mouth two (2) times a day., Disp: , Rfl: ;  levothyroxine (SYNTHROID) 50 mcg tablet, Take  by mouth Daily (before breakfast)., Disp: , Rfl: ;  MULTIVITAMIN WITH MINERALS (ONE-A-DAY 50 PLUS PO), Take  by mouth., Disp: , Rfl: ;  PARoxetine (PAXIL) 20 mg tablet, Take  by mouth daily., Disp: , Rfl:       REVIEW OF SYSTEMS:  GENERAL/CONSTITUTIONAL: negative  HEAD, EYES, EARS, NOSE AND THROAT: negative  CARDIOVASCULAR: negative  RESPIRATORY:  Cough and congestion  GASTROINTESTINAL: negative  GENITOURINARY: negative  MUSCULOSKELETAL: negative  BREASTS: negative  SKIN:  negative  NEUROLOGIC: negative  PSYCHIATRIC: negative  ENDOCRINE:  negative  HEMATOLOGIC/LYMPHATIC:  negative  ALLERGIC/IMMUNOLOGIC:  negative  PHYSICAL EXAM  Vital Signs - BP 132/75   Pulse 82   Temp(Src) 98.3 ??F (36.8 ??C)   Ht 5\' 7"  (1.702 m)   Wt 140 lb (63.504 kg)   BMI 21.92 kg/m2   Constitutional - alert, well appearing, and in no distress.  Eyes - pupils equal and reactive, extraocular eye movements intact.  Ear, Nose, Mouth, Throat - normal  Examination  of oropharynx: oral mucosa normal  Neck - supple, no significant adenopathy.   Respiratory - clear to auscultation, no wheezes, few basilar crackles heard bilaterally.  Cardiovascular - normal rate, regular rhythm, normal S1, S2, systolic murmur grade 2/6 mitral area     Gastrointestinal - Abdomen soft, nontender, nondistended, no masses.  Back exam - full range of motion, no tenderness, palpable spasm or pain on motion  Musculoskeletal -  No joint tenderness, deformity or swelling.  Skin - normal coloration and turgor, no rashes, no suspicious skin lesions noted.  Neurological - Cranial nerves with notation of any deficits. Motor and sensory exam is intact   Extremities - peripheral pulses normal, no pedal edema, no clubbing or cyanosis  Psychiatric - alert, oriented to person, place, and time, recent and remote memory, mood and affect     LABS  Results for orders placed in visit on 01/02/13   MAGNESIUM       Result Value Range    Magnesium 2.2  1.6 - 2.6 mg/dL   VITAMIN Z61       Result Value Range    Vitamin B12 516  211 - 946 pg/mL   AMB POC COMPREHENSIVE METABOLIC PANEL       Result Value Range    Sodium (POC) 141.8  134 - 146 mmol/L    Potassium (POC) 4.39  3.5 - 5.1 mmol/L    CHLORIDE 106.7  96 - 118 mmol/L    CO2 26.7  21 - 35 mmol/L    GLUCOSE 92  70 - 105 mg/dL    BUN 13  7 - 18 mg/dL    CREATININE 0.9  0.6 - 1.3 mg/dL    ALBUMIN 3.7  3.2 - 5.5 g/dL    ALKALINE PHOS 93  42 - 128 IU/L    AST (POC) 24  10 - 42 IU/L    ALT (POC) 16  10 - 60 IU/L    TOTAL BILIRUBIN 0.8  0.2 - 1 mg/dL    TOTAL PROTEIN 6.0 (*) 6.1 - 8.2 g/dL    CALCIUM 9.5  8 - 09.6 mg/dL   AMB POC TSH       Result Value Range    TSH POC 3.05  0.54 - 5.2 uIU/ml   AMB POC T4       Result Value Range    T4 POC 8.76  4.9 - 12 ug/dl   AMB POC LIPID PROFILE       Result Value Range    Cholesterol (POC) 188      Triglycerides (POC) 164      HDL Cholesterol (POC) 52.1      LDL Cholesterol (POC) 103      Non-HDL Goal (POC)        TChol/HDL Ratio (POC)  1.6     AMB POC HELICOBACTER PYLORI       Result Value Range    H. pylori (POC) NEG               IMPRESSION:    ICD-9-CM   1. Hypothyroidism 244.9  2. B12 deficiency 266.2   3. Depression 311   4. COPD (chronic obstructive pulmonary disease) 496   5. Esophageal reflux 530.81          PLAN:  I explained to daughter, this could be atelectasis related.  She is advised to use incentive spirometry.  She is advised to stay with a low-salt, low-cholesterol diet.  Mucinex 1 tablet twice a day we did not start on any antibiotic.  She is advised to monitor her temperature at home and follow-up is recommended.  Next few weeks.  We reviewed her lab work done recently.  Continue physical therapy.  See orders and medication list    Stevie Kern, MD          Dictated using voice recognition software. Proofread, but unrecognized voice recognition errors may exist.

## 2013-01-23 NOTE — Progress Notes (Signed)
[facility]  Kelvon Giannini C. Allena Katz, M.D.  Deloris Ping, M.D.  [address]  [place],  [place] [address]  Ph No:  [phone]  Fax:  [phone]      CHIEF COMPLAINT:  Follow up         HISTORY OF PRESENT ILLNESS: Jo Simmons is a 79 y.o. female with past medical history positive for anemia, hypothyroidism, history of CHF, cardiomegaly, heart or hernia.  History of hip surgery.  She is seen in office today accompanied by her daughter stating that she is doing better.  She had been sleeping most of the time.  Patient has been on Paxil for a long time.  She has started taking it in the evening, but has not seen any benefit.  She has also completed Fosamax therapy for more than 3 years.  She denies any abdominal pain.  Denies any nausea, no vomiting      HISTORY:  No Known Allergies  Past Medical History   Diagnosis Date   ??? Anemia    ??? Chronic obstructive pulmonary disease    ??? GERD (gastroesophageal reflux disease)    ??? Depression    ??? Arthritis    ??? Thyroid disease      hypothyroid   ??? Congestive heart failure, unspecified    ??? Cardiomegaly    ??? HH (hiatus hernia)    ??? Psychotic disorder      memory loss- short term     Past Surgical History   Procedure Laterality Date   ??? Hx hip replacement       right side   ??? Hx endoscopy     ??? Hx urological     ??? Hx gi     ??? Pr cardiac surg procedure unlist     ??? Hx orthopaedic       right hip replacement   ??? Hx appendectomy     ??? Hx tonsillectomy       Family History   Problem Relation Age of Onset   ??? Alcohol abuse Father      History     Social History   ??? Marital Status: UNKNOWN     Spouse Name: N/A     Number of Children: N/A   ??? Years of Education: N/A     Occupational History   ??? Not on file.     Social History Main Topics   ??? Smoking status: Never Smoker    ??? Smokeless tobacco: Not on file   ??? Alcohol Use: Yes      Comment: social   ??? Drug Use: No   ??? Sexually Active: Not on file     Other Topics Concern   ??? Not on file      Social History Narrative   ??? No narrative on file     Current Outpatient Prescriptions   Medication Sig Dispense Refill   ??? ergocalciferol (ERGOCALCIFEROL) 50,000 unit capsule Take 50,000 Units by mouth every seven (7) days.       ??? traMADol (ULTRAM) 50 mg tablet Take 50 mg by mouth every six (6) hours as needed for Pain.       ??? sucralfate (CARAFATE) 1 gram tablet Take 1 g by mouth two (2) times a day.       ??? oxybutynin (GELNIQUE) 28 mg/0.92 gram (3 %) glpm by TransDERmal route daily.       ??? estradiol (ESTRACE) 0.01 % (0.1 mg/gram) vaginal cream Insert  2 g into vagina. mon wed  fri       ??? docusate sodium (COLACE) 100 mg capsule Take 100 mg by mouth two (2) times a day.       ??? omeprazole (PRILOSEC) 20 mg capsule Take 20 mg by mouth daily.       ??? calcium carbonate (CALTREX) 600 mg (1,500 mg) tablet Take 600 mg by mouth two (2) times a day.       ??? cyanocobalamin (VITAMIN B12) 1,000 mcg/mL injection 1,000 mcg by IntraMUSCular route every thirty (30) days.       ??? donepezil (ARICEPT) 5 mg tablet Take  by mouth nightly.       ??? ferrous sulfate 325 mg (65 mg iron) tablet Take 325 mg by mouth two (2) times a day.       ??? levothyroxine (SYNTHROID) 50 mcg tablet Take  by mouth Daily (before breakfast).       ??? MULTIVITAMIN WITH MINERALS (ONE-A-DAY 50 PLUS PO) Take  by mouth.       ??? PARoxetine (PAXIL) 20 mg tablet Take  by mouth daily.           MEDICATIONS:  Current outpatient prescriptions:ergocalciferol (ERGOCALCIFEROL) 50,000 unit capsule, Take 50,000 Units by mouth every seven (7) days., Disp: , Rfl: ;  traMADol (ULTRAM) 50 mg tablet, Take 50 mg by mouth every six (6) hours as needed for Pain., Disp: , Rfl: ;  sucralfate (CARAFATE) 1 gram tablet, Take 1 g by mouth two (2) times a day., Disp: , Rfl: ;  oxybutynin (GELNIQUE) 28 mg/0.92 gram (3 %) glpm, by TransDERmal route daily., Disp: , Rfl:   estradiol (ESTRACE) 0.01 % (0.1 mg/gram) vaginal cream, Insert 2 g into vagina. mon wed  fri, Disp: , Rfl: ;  docusate  sodium (COLACE) 100 mg capsule, Take 100 mg by mouth two (2) times a day., Disp: , Rfl: ;  omeprazole (PRILOSEC) 20 mg capsule, Take 20 mg by mouth daily., Disp: , Rfl: ;  calcium carbonate (CALTREX) 600 mg (1,500 mg) tablet, Take 600 mg by mouth two (2) times a day., Disp: , Rfl:   cyanocobalamin (VITAMIN B12) 1,000 mcg/mL injection, 1,000 mcg by IntraMUSCular route every thirty (30) days., Disp: , Rfl: ;  donepezil (ARICEPT) 5 mg tablet, Take  by mouth nightly., Disp: , Rfl: ;  ferrous sulfate 325 mg (65 mg iron) tablet, Take 325 mg by mouth two (2) times a day., Disp: , Rfl: ;  levothyroxine (SYNTHROID) 50 mcg tablet, Take  by mouth Daily (before breakfast)., Disp: , Rfl:   MULTIVITAMIN WITH MINERALS (ONE-A-DAY 50 PLUS PO), Take  by mouth., Disp: , Rfl: ;  PARoxetine (PAXIL) 20 mg tablet, Take  by mouth daily., Disp: , Rfl:       REVIEW OF SYSTEMS:  GENERAL/CONSTITUTIONAL: negative  HEAD, EYES, EARS, NOSE AND THROAT: negative  CARDIOVASCULAR: negative  RESPIRATORY:  negative   GASTROINTESTINAL: negative  GENITOURINARY: negative  MUSCULOSKELETAL: right hip surgery recuperating nicely  BREASTS: negative  SKIN:  negative  NEUROLOGIC: sleepiness  PSYCHIATRIC: history of depression, improving  ENDOCRINE:  negative  HEMATOLOGIC/LYMPHATIC:  negative  ALLERGIC/IMMUNOLOGIC:  negative        PHYSICAL EXAM  Vital Signs - BP 130/80   Pulse 91   Ht 5\' 7"  (1.702 m)   Wt 137 lb (62.143 kg)   BMI 21.45 kg/m2   Constitutional - alert, well appearing, and in no distress.  Eyes - pupils equal and reactive, extraocular eye movements  intact.  Ear, Nose, Mouth, Throat - normal  Examination of oropharynx: oral mucosa nnormal  Neck - supple, no significant adenopathy.   Respiratory - clear to auscultation, no wheezes, rales or bronchi, symmetric air entry.  Cardiovascular - normal rate, regular rhythm, normal S1, S2, systolic murmur grade 2/6 mitral area     Gastrointestinal - Abdomen soft, nontender, nondistended, no masses.   Musculoskeletal -  DJD changes evident.  Small joints of the hand  Skin - normal coloration and turgor, no rashes, no suspicious skin lesions noted.  Neurological - Cranial nerves with notation of any deficits. Motor and sensory exam is intact   Extremities - peripheral pulses normal, no pedal edema, no clubbing or cyanosis  Psychiatric - alert, oriented to person, place, and time, recent and remote memory, mood and affect normal    LABS  Results for orders placed in visit on 01/02/13   MAGNESIUM       Result Value Range    Magnesium 2.2  1.6 - 2.6 mg/dL   VITAMIN O13       Result Value Range    Vitamin B12 516  211 - 946 pg/mL   AMB POC COMPREHENSIVE METABOLIC PANEL       Result Value Range    Sodium (POC) 141.8  134 - 146 mmol/L    Potassium (POC) 4.39  3.5 - 5.1 mmol/L    CHLORIDE 106.7  96 - 118 mmol/L    CO2 26.7  21 - 35 mmol/L    GLUCOSE 92  70 - 105 mg/dL    BUN 13  7 - 18 mg/dL    CREATININE 0.9  0.6 - 1.3 mg/dL    ALBUMIN 3.7  3.2 - 5.5 g/dL    ALKALINE PHOS 93  42 - 128 IU/L    AST (POC) 24  10 - 42 IU/L    ALT (POC) 16  10 - 60 IU/L    TOTAL BILIRUBIN 0.8  0.2 - 1 mg/dL    TOTAL PROTEIN 6.0 (*) 6.1 - 8.2 g/dL    CALCIUM 9.5  8 - 08.6 mg/dL   AMB POC TSH       Result Value Range    TSH POC 3.05  0.54 - 5.2 uIU/ml   AMB POC T4       Result Value Range    T4 POC 8.76  4.9 - 12 ug/dl   AMB POC LIPID PROFILE       Result Value Range    Cholesterol (POC) 188      Triglycerides (POC) 164      HDL Cholesterol (POC) 52.1      LDL Cholesterol (POC) 103      Non-HDL Goal (POC)        TChol/HDL Ratio (POC) 1.6     AMB POC HELICOBACTER PYLORI       Result Value Range    H. pylori (POC) NEG               IMPRESSION:    ICD-9-CM   1. Hypothyroidism 244.9   2. B12 deficiency 266.2   3. Hip fx 820.8   4. COPD (chronic obstructive pulmonary disease) 496   5. Esophageal reflux 530.81          PLAN:  I discussed with patient.  Overall she is doing very well.  Her hip fracture has rectal bleeding.  Her hemoglobin has come  up .  Advised to stop iron therapy.  She is  advised to stop correlation is advised to stop Fosamax therapy sleepiness could be Paxil related.  I have advised her to reduce the dose of Paxil 10 mg daily and gradually wean herself off over 3 weeks.Marland Kitchen  She will continue other medication.  Follow-up is recommended in next month.       Stevie Kern, MD          Dictated using voice recognition software. Proofread, but unrecognized voice recognition errors may exist.

## 2013-01-28 NOTE — Telephone Encounter (Signed)
Lm for Jo Simmons letting her kmow we were putting her back on protonix for her stomach upset and to try that and if doesn't help to call or come back in.cv  Orders Placed This Encounter   ??? DISCONTD: pantoprazole (PROTONIX) 40 mg tablet     Sig: Take 40 mg by mouth daily.   ??? pantoprazole (PROTONIX) 40 mg tablet     Sig: Take 1 Tab by mouth daily.     Dispense:  30 Tab     Refill:  5     Per dr

## 2013-02-06 NOTE — Telephone Encounter (Signed)
[place] called about her paxil. The dose was decreased to 10mg  from 20mg  and her aricept was stopped. Patient is having meltdowns about memory loss. Per dr patel she can go back up to 20mg  and come back in 3-4 weeks ot to call back if still having problems.cv

## 2013-02-10 LAB — AMB POC URINALYSIS DIP STICK AUTO W/ MICRO

## 2013-02-10 NOTE — Progress Notes (Signed)
[facility]  [address]  [place], [place] [address]  [phone]    Jo Simmons  DOB: [birth year]    Chief Complaint   Patient presents with   ??? Other     Urethral Prolapse - New patient referral from Dr. Valentina Lucks           HPI     Jo Simmons is a 79 y.o. female who presents to office for evaluation of urethral prolapse referred by Dr Valentina Lucks (oncology).  Patient saw a Insurance underwriter in [place] approximately 1 year ago for UTIs and urge incontinence. Was started on Estrace vaginal cream and Gelnique at that time however fell and has been in rehab for several months after Right THR.  She has been off Estrace cream 3 months and noted to have urethral prolapse when she was catheterized in rehab.  Patient was referred to Dr Valentina Lucks and was told culture/biopsy was benign.  01/03/13 cytology was negative.  Patient had UTIs in rehab however no UTIs since returned home.   Patient denies any bladder or urethral pain.  Denies any gross hematuria. Daytime frequency is q3hours and no nocturia.  She admits to occasional urge incontinence which is not bothersome to her.       Past Medical History   Diagnosis Date   ??? Anemia    ??? Chronic obstructive pulmonary disease    ??? GERD (gastroesophageal reflux disease)    ??? Depression    ??? Arthritis    ??? Thyroid disease      hypothyroid   ??? Congestive heart failure, unspecified    ??? Cardiomegaly    ??? HH (hiatus hernia)    ??? Psychotic disorder      memory loss- short term   ??? Depression    ??? Osteoarthritis    ??? Gastritis    ??? Heart abnormality      cardiomegaly    ??? Anemia, pernicious    ??? Hiatal hernia      Past Surgical History   Procedure Laterality Date   ??? Hx hip replacement       right side   ??? Hx endoscopy     ??? Hx urological     ??? Hx gi     ??? Pr cardiac surg procedure unlist     ??? Hx orthopaedic       right hip replacement   ??? Hx appendectomy     ??? Hx tonsillectomy     ??? Hx tonsillectomy     ??? Pr appendectomy     ??? Hx hip replacement  11/15/2012     Current Outpatient  Prescriptions   Medication Sig Dispense Refill   ??? ergocalciferol (ERGOCALCIFEROL) 50,000 unit capsule Take 50,000 Units by mouth every seven (7) days.       ??? estradiol (ESTRACE) 0.01 % (0.1 mg/gram) vaginal cream Insert 2 g into vagina. mon wed  fri       ??? docusate sodium (COLACE) 100 mg capsule Take 100 mg by mouth two (2) times a day.       ??? calcium carbonate (CALTREX) 600 mg (1,500 mg) tablet Take 600 mg by mouth two (2) times a day.       ??? cyanocobalamin (VITAMIN B12) 1,000 mcg/mL injection 1,000 mcg by IntraMUSCular route every thirty (30) days.       ??? levothyroxine (SYNTHROID) 50 mcg tablet Take  by mouth Daily (before breakfast).       ??? MULTIVITAMIN WITH MINERALS (ONE-A-DAY 50  PLUS PO) Take  by mouth.       ??? PARoxetine (PAXIL) 20 mg tablet Take  by mouth daily.       ??? pantoprazole (PROTONIX) 40 mg tablet Take 1 Tab by mouth daily.  30 Tab  5   ??? traMADol (ULTRAM) 50 mg tablet Take 50 mg by mouth every six (6) hours as needed for Pain.       ??? sucralfate (CARAFATE) 1 gram tablet Take 1 g by mouth two (2) times a day.       ??? oxybutynin (GELNIQUE) 28 mg/0.92 gram (3 %) glpm by TransDERmal route daily.       ??? donepezil (ARICEPT) 5 mg tablet Take  by mouth nightly.       ??? ferrous sulfate 325 mg (65 mg iron) tablet Take 325 mg by mouth two (2) times a day.         No Known Allergies  History     Social History   ??? Marital Status: UNKNOWN     Spouse Name: N/A     Number of Children: N/A   ??? Years of Education: N/A     Occupational History   ??? Not on file.     Social History Main Topics   ??? Smoking status: Never Smoker    ??? Smokeless tobacco: Not on file   ??? Alcohol Use: Yes      Comment: social   ??? Drug Use: No   ??? Sexually Active: No     Other Topics Concern   ??? Not on file     Social History Narrative   ??? No narrative on file     Family History   Problem Relation Age of Onset   ??? Alcohol abuse Father      Review of Systems  Constitutional:   Negative for fever, chills and headaches.  Skin: Negative  skin ROS  Respiratory:  Negative for cough, shortness of breath and wheezing.  Cardiovascular:  Negative for chest pain, hypertension and varicose veins.  GI: Positive for indigestion (occasionally ) and heartburn (occasionally ). Negative for nausea, vomiting and abdominal pain.  Genitourinary: Positive for recurrent UTIs and nocturia (at least one time per night ). Negative for urinary burning, hematuria, flank pain, history of urolithiasis, slower stream, straining, urgency, leakage w/ urge, frequent urination, incomplete emptying, sexually transmitted disease, menstrual problem, endometriosis, vaginal pain and hysterectomy.Number of pregnancies: 5.  Number of births: 4.  Musculoskeletal: Positive for arthralgias (knees). Negative for back pain and neck pain.  Neurological: Positive for dizziness (occasionally). Negative for numbness and tremors.  Psychological: Neg psych ROS  Endocrine: Positive for fatigue. Negative for cold intolerance and heat intolerance.  Hem/Lymphatic:  Negative for easy bleeding.          Urinalysis  UA - Dipstick  Glucose - Negative  Bilirubin - Small  Ketones - Trace  Specific Gravity - 1.025  Blood - Negative  PH - 6.5  Protein - Trace  Urobilinogen - 0.2 E.U./DL  Nitrite - Negative  Leukocyte Esterase - Trace    UA - Micro  WBC - 0  RBC - 0  Bacteria - 0  Epith - 0    PHYSICAL EXAM    BP 80/53   Pulse 134   Temp(Src) 98.7 ??F (37.1 ??C) (Tympanic)   Ht 5\' 7"  (1.702 m)   Wt 134 lb (60.782 kg)   BMI 20.98 kg/m2    General appearance - alert, well appearing, and  in no distress  Mental status - alert, oriented to person, place, and time  Eyes - Bilaterally normal & regular  Nose - normal and patent, no erythema, discharge   Neck - supple, no significant adenopathy  Chest/Lung-  Quiet, even and easy respiratory effort without use of accessory muscles  Abdomen - soft, nontender, nondistended, no masses or organomegaly  Back exam - full range of motion, no tenderness  Neurological - alert,  oriented, normal speech, no focal findings or movement disorder noted on gross visual exam  Musculoskeletal - walker  Extremities - normal full range of motion of all extremities  Skin - normal coloration and turgor, no rashes    Female Genitourinary    External Genitalia  Urethra: Characteristics- Urethral meatus mucosal prolapse  Vulva: Characteristics- Normal, Lesions- None    Speculum & Bimanual  Vagina:  Vaginal Wall: Enterocele- Negative,  Rectocele- Negative  Cystocele- Grade:  1  Vaginal Lesions- None   Vaginal Mucosa- Atrophic:  mild  Cervix: Characteristics- Normal  Uterus: Characteristics- Normal  Adnexa: Characteristics- Non-palpable  Bladder- Normal without fullness, masses, or tenderness  Rectal - normal rectal tone, no mass        PLAN  Continue vaginal estrogen cream qhs x 2month then 2-3x/week  RTO for cysto to evaluate further  RTO 6months for f/u with NP      Assessment and Plan    ICD-9-CM    1. Urethral prolapse 599.5 AMB POC URINALYSIS DIP STICK AUTO W/ MICRO       Follow-up Disposition:  Return in about 6 months (around 08/13/2013) for OV/follow up with Carollee Herter.    Lucky Cowboy, WHNP-BC  Time Spent with patient:  20 Minutes (>50% of this time was spent on counseling this patient about urethral prolapse)  Supervising Physician: Dr Dimple Casey

## 2013-02-27 NOTE — Progress Notes (Signed)
[facility]  Talayeh Bruinsma C. Allena Katz, M.D.  Deloris Ping, M.D.  [address]  [place], [place] [address]  Ph No:  [phone]  Fax:  [phone]      CHIEF COMPLAINT: Follow-up visit         HISTORY OF PRESENT ILLNESS: [name] is a 79 y.o. female with Past medical history positive for anemia, COPD, GERD, depression, DJD.  She is seen in office today accompanied by daughter stating that overall she is feeling fine except daughter has noted to have she has memory loss and she became Concerned.  She is taking her antidepressant that has helped her.  She denies any blood in the stool or black stools.  Denies any palpitation.  Denies any coughing.  Denies any congestion.  Denies any abdominal pain.      HISTORY:  No Known Allergies  Past Medical History   Diagnosis Date   ??? Anemia    ??? Chronic obstructive pulmonary disease    ??? GERD (gastroesophageal reflux disease)    ??? Depression    ??? Arthritis    ??? Thyroid disease      hypothyroid   ??? Congestive heart failure, unspecified    ??? Cardiomegaly    ??? HH (hiatus hernia)    ??? Psychotic disorder      memory loss- short term   ??? Depression    ??? Osteoarthritis    ??? Gastritis    ??? Heart abnormality      cardiomegaly    ??? Anemia, pernicious    ??? Hiatal hernia      Past Surgical History   Procedure Laterality Date   ??? Hx hip replacement       right side   ??? Hx endoscopy     ??? Hx urological     ??? Hx gi     ??? Pr cardiac surg procedure unlist     ??? Hx orthopaedic       right hip replacement   ??? Hx appendectomy     ??? Hx tonsillectomy     ??? Hx tonsillectomy     ??? Pr appendectomy     ??? Hx hip replacement  11/15/2012     Family History   Problem Relation Age of Onset   ??? Alcohol abuse Father      History     Social History   ??? Marital Status: UNKNOWN     Spouse Name: N/A     Number of Children: N/A   ??? Years of Education: N/A     Occupational History   ??? Not on file.     Social History Main Topics   ??? Smoking status: Never Smoker    ???  Smokeless tobacco: Not on file   ??? Alcohol Use: Yes      Comment: social   ??? Drug Use: No   ??? Sexually Active: No     Other Topics Concern   ??? Not on file     Social History Narrative   ??? No narrative on file     Current Outpatient Prescriptions   Medication Sig Dispense Refill   ??? L-Methylfolate-B12-Acetylcyst (CEREFOLIN NAC) tablet Take 1 Tab by mouth daily.  90 Each  4   ??? pantoprazole (PROTONIX) 40 mg tablet Take 1 Tab by mouth daily.  30 Tab  5   ??? ergocalciferol (ERGOCALCIFEROL) 50,000 unit capsule Take 50,000 Units by mouth every seven (7) days.       ???  estradiol (ESTRACE) 0.01 % (0.1 mg/gram) vaginal cream Insert 2 g into vagina. mon wed  fri       ??? docusate sodium (COLACE) 100 mg capsule Take 100 mg by mouth two (2) times a day.       ??? calcium carbonate (CALTREX) 600 mg (1,500 mg) tablet Take 600 mg by mouth two (2) times a day.       ??? cyanocobalamin (VITAMIN B12) 1,000 mcg/mL injection 1,000 mcg by IntraMUSCular route every thirty (30) days.       ??? levothyroxine (SYNTHROID) 50 mcg tablet Take  by mouth Daily (before breakfast).       ??? MULTIVITAMIN WITH MINERALS (ONE-A-DAY 50 PLUS PO) Take  by mouth.       ??? PARoxetine (PAXIL) 20 mg tablet Take  by mouth daily.           MEDICATIONS:  Current outpatient prescriptions:L-Methylfolate-B12-Acetylcyst (CEREFOLIN NAC) tablet, Take 1 Tab by mouth daily., Disp: 90 Each, Rfl: 4;  pantoprazole (PROTONIX) 40 mg tablet, Take 1 Tab by mouth daily., Disp: 30 Tab, Rfl: 5;  ergocalciferol (ERGOCALCIFEROL) 50,000 unit capsule, Take 50,000 Units by mouth every seven (7) days., Disp: , Rfl:   estradiol (ESTRACE) 0.01 % (0.1 mg/gram) vaginal cream, Insert 2 g into vagina. mon wed  fri, Disp: , Rfl: ;  docusate sodium (COLACE) 100 mg capsule, Take 100 mg by mouth two (2) times a day., Disp: , Rfl: ;  calcium carbonate (CALTREX) 600 mg (1,500 mg) tablet, Take 600 mg by mouth two (2) times a day., Disp: , Rfl: ;  cyanocobalamin (VITAMIN B12) 1,000 mcg/mL injection, 1,000 mcg  by IntraMUSCular route every thirty (30) days., Disp: , Rfl:   levothyroxine (SYNTHROID) 50 mcg tablet, Take  by mouth Daily (before breakfast)., Disp: , Rfl: ;  MULTIVITAMIN WITH MINERALS (ONE-A-DAY 50 PLUS PO), Take  by mouth., Disp: , Rfl: ;  PARoxetine (PAXIL) 20 mg tablet, Take  by mouth daily., Disp: , Rfl:       REVIEW OF SYSTEMS:  GENERAL/CONSTITUTIONAL: negative  HEAD, EYES, EARS, NOSE AND THROAT: negative  CARDIOVASCULAR: negative  RESPIRATORY:  negative   GASTROINTESTINAL: negative  GENITOURINARY: negative  MUSCULOSKELETAL:History of hip surgery  BREASTS: negative  SKIN:  negative  NEUROLOGIC: Mild memory loss noted by daughter  PSYCHIATRIC: negative  ENDOCRINE:  negative  HEMATOLOGIC/LYMPHATIC:  negative  ALLERGIC/IMMUNOLOGIC:  negative        PHYSICAL EXAM  Vital Signs - BP 112/81   Pulse 90   Wt 136 lb (61.689 kg)   BMI 21.3 kg/m2   Constitutional - alert, well appearing, and in no distress.  Eyes - pupils equal and reactive, extraocular eye movements intact.  Ear, Nose, Mouth, Throat - Normal  Examination of oropharynx: oral mucosa Normal  Neck - supple, no significant adenopathy.   Respiratory - clear to auscultation, no wheezes, rales or bronchi, symmetric air entry.  Cardiovascular - normal rate, regular rhythm, normal S1, S2,      Gastrointestinal - Abdomen soft, nontender, nondistended, no masses.  Back exam - full range of motion, no tenderness, palpable spasm or pain on motion  Musculoskeletal -  No joint tenderness, deformity or swelling.  Skin - normal coloration and turgor, no rashes, no suspicious skin lesions noted.  Neurological - Cranial nerves with notation of any deficits. Motor and sensory exam is intact   Extremities - peripheral pulses normal, no pedal edema, no clubbing or cyanosis  Psychiatric - alert, oriented to person, place, and  time, recent and remote memory, mood and affect     LABS  Results for orders placed in visit on 02/10/13   AMB POC URINALYSIS DIP STICK AUTO W/ MICRO        Result Value Range    Color (UA POC)        Clarity (UA POC)        Glucose (UA POC)    Negative    Bilirubin (UA POC)    Negative    Ketones (UA POC)    Negative    Specific gravity (UA POC)    1.001 - 1.035    Blood (UA POC)    Negative    pH (UA POC)    4.6 - 8.0    Protein (UA POC)    Negative mg/dL    Urobilinogen (UA POC)    0.2 - 1    Nitrites (UA POC)    Negative    Leukocyte esterase (UA POC)    Negative             IMPRESSION:    ICD-9-CM   1. Hypothyroidism 244.9   2. B12 deficiency 266.2   3. Depression 311   4. COPD (chronic obstructive pulmonary disease) 496          PLAN:  I explained patient several things could cause the same symptoms as B12 deficiency along with thyroid condition can give her mild cognitive impairment.  She is advised to continue B12 therapy at home.  She is also given prescription For CereFolin 1 capsule daily.  Stay with the low-salt, low-cholesterol diet, exercise program.  She is also given Symbicort 160//4.5 one puff twice a day follow-up is recommended.  See orders and medication list    Stevie Kern, MD          Dictated using voice recognition software. Proofread, but unrecognized voice recognition errors may exist.

## 2013-02-28 LAB — VITAMIN D, 25 HYDROXY: VITAMIN D, 25-HYDROXY: 57.6 ng/mL (ref 30.0–100.0)

## 2013-02-28 LAB — PARATHYROID HORMONE, INTACT + CALCIUM
Calcium: 9.1 mg/dL (ref 8.6–10.2)
PTH, Intact: 40 pg/mL (ref 15–65)

## 2013-03-18 NOTE — Progress Notes (Signed)
Ambulatory/Rehab Services H2 Model Falls Risk Assessment    Risk Factor Pts. ??   Confusion/Disorientation/Impulsivity  []    4 ??   Symptomatic Depression  []   2 ??   Altered Elimination  []   1 ??   Dizziness/Vertigo  []   1 ??   Gender (Female)  []   1 ??   Any administered antiepileptics (anticonvulsants):  []   2 ??   Any administered benzodiazepines:  []   1 ??   Visual Impairment (specify):  []   1 ??   Portable Oxygen Use  []   1 ??   Orthostatic ? BP  []   1 ??   History of Recent Falls (within 3 mos.)  []   5     Ability to Rise from Chair (choose one) Pts. ??   Ability to rise in a single movement  [x]   0 ??   Pushes up, successful in one attempt  []   1 ??   Multiple attempts, but successful  []   3 ??   Unable to rise without assistance  []   4   Total: (5 or greater = High Risk) 0     Falls Prevention Plan:   []                Physical Limitations to Exercise (specify):   []                Mobility Assistance Device (type):   []                Exercise/Equipment Adaptation (specify):    ??2010 [employer] All Rights Reserved. [place] Patent #[id number]. Federal Law prohibits the replication, distribution or use without written permission from [employer]

## 2013-03-18 NOTE — Progress Notes (Signed)
[facility]rapy [facility] at [facility] [address] [place], [place] [address]  Phone: [phone]   Fax: [phone]    Outpatient PHYSICAL THERAPY: Initial Assessment and Discharge  Fall Risk Score: 0 (? 5 = High Risk)  Treatment Diagnosis: difficulty walking  REFERRING PHYSICIAN: Overton Mam, MD  MD Orders: Balance Master Test; 1 visit  Return Physician Appointment: March 27, 2013  MEDICAL/REFERRING DIAGNOSIS: Pain in hip joint, right [719.45]  Other physical therapy [V57.1]  DATE OF ONSET: November 14, 2012   PRIOR LEVEL OF FUNCTION: Independent  PRECAUTIONS/ALLERGIES: No Known Allergies  ASSESSMENT:  ????????This section established at most recent assessment??????????  Patient referred for Balance Master Test following a fall and R hip hemiarthroplasty.  Patient is currently using a rolling walker and participating in aquatic therapy 3 times per week.  She is above a fall risk on the Dynamic Gait Index but only slightly above a fall risk on the Berg Balance Scale.  She demonstrated better balance with dynamic movements (walking with head movements, walking around and over obstacles) than with static balance with a narrowed base of support (single limb stance, tandem stance).  Patient demonstrated normal use of somatosensory system, and impaired use of the visual and vestibular systems on the Sensory Organization Test (SOT).  She was very anxious with an unstable surface (floor moving) with her eyes open and closed.  Patient demonstrated normal attributes with the Motor Control Test (MCT) though she was very anxious with forward and backward disturbances in all ranges (small, medium, and large).  Spoke with patient and patient's daughter with concerns with patient's balance and confidence in unfamiliar or uncontrolled environments, such as walking outside or in the store, without an assistive device.  Patient also demonstrates some confusion and memory  issues which can further affect her balance.  Patient to return to MD to discuss follow-up recommendations.    PLAN OF CARE:  FREQUENCY/DURATION: Patient to continue with current treatment plans and follow-up with MD regarding additional recommendations.    Regarding [place] therapy, I certify that the treatment plan above will be carried out by a therapist or under their direction.  Thank you for this referral,  Georgeanna Harrison, PT     Referring Physician Signature: Overton Mam, MD         Date           SUBJECTIVE:  History of Present Injury/Illness (Reason for Referral): Patient had a fall in November 14, 2012 and underwent surgery for R hip hemiarthroplasty on November 15, 2012.  Patient went to inpatient rehab and then to outpatient rehab.  She is currently participating in aquatic therapy 3 times per week at [place].  Patient is using the rolling walker fairly regularly at this time, though daughter reports she has found her walking in the apartment without her walker.  Patient reports when she doesn't have her walker, she uses furniture for support.  Prior to the fall, patient occasionally used a cane because of knee pain but was otherwise independent.    Present Symptoms: B knee pain, R hip pain   Pain Intensity 1: 2  Pain Location 1: Knee  Pain Orientation 1: Left;Right  Dominant Side: right  Past Medical History:   Past Medical History   Diagnosis Date   ??? Anemia    ??? Chronic obstructive pulmonary disease    ??? GERD (gastroesophageal reflux disease)    ??? Depression    ???  Arthritis    ??? Thyroid disease      hypothyroid   ??? Congestive heart failure, unspecified    ??? Cardiomegaly    ??? HH (hiatus hernia)    ??? Psychotic disorder      memory loss- short term   ??? Depression    ??? Osteoarthritis    ??? Gastritis    ??? Heart abnormality      cardiomegaly    ??? Anemia, pernicious    ??? Hiatal hernia      Past Surgical History   Procedure Laterality Date   ??? Hx hip replacement       right side   ??? Hx endoscopy      ??? Hx urological     ??? Hx gi     ??? Pr cardiac surg procedure unlist     ??? Hx orthopaedic       right hip replacement   ??? Hx appendectomy     ??? Hx tonsillectomy     ??? Hx tonsillectomy     ??? Pr appendectomy     ??? Hx hip replacement  11/15/2012     Current Medications:   Current Outpatient Prescriptions   Medication Sig   ??? L-Methylfolate-B12-Acetylcyst (CEREFOLIN NAC) tablet Take 1 Tab by mouth daily.   ??? budesonide-formoterol (SYMBICORT) 160-4.5 mcg/actuation HFA inhaler Take 2 Puffs by inhalation two (2) times a day.   ??? PARoxetine (PAXIL) 20 mg tablet Take 1 Tab by mouth daily.   ??? pantoprazole (PROTONIX) 40 mg tablet Take 1 Tab by mouth daily.   ??? ergocalciferol (ERGOCALCIFEROL) 50,000 unit capsule Take 50,000 Units by mouth every seven (7) days.   ??? estradiol (ESTRACE) 0.01 % (0.1 mg/gram) vaginal cream Insert 2 g into vagina. mon wed  fri   ??? docusate sodium (COLACE) 100 mg capsule Take 100 mg by mouth two (2) times a day.   ??? calcium carbonate (CALTREX) 600 mg (1,500 mg) tablet Take 600 mg by mouth two (2) times a day.   ??? cyanocobalamin (VITAMIN B12) 1,000 mcg/mL injection 1,000 mcg by IntraMUSCular route every thirty (30) days.   ??? levothyroxine (SYNTHROID) 50 mcg tablet Take  by mouth Daily (before breakfast).   ??? MULTIVITAMIN WITH MINERALS (ONE-A-DAY 50 PLUS PO) Take  by mouth.     No current facility-administered medications for this encounter.   Date Last Reviewed: 03/18/2013    Social History/Home Situation:    Home Environment: Apartment  # Steps to Enter: 0  One/Two Story Residence: One story  Living Alone: Yes   Support Systems: Child(ren)  Current DME Used/Available at Home: Cane, quad;Safety frame [place];Tub transfer bench;Walker, rollator;Walker, rolling (reacher/grabber)  Tub or Shower Type: Tub/Shower combination  Work/Activity History: Patient is retired.  She does have memory issues and no longer drives.  Patient enjoys reading and painting.   OBJECTIVE:  GROSS PRESENTATION/POSTURE: Patient  entered clinic with rolling walker.  FUNCTIONAL MOBILITY:  ?? Bed Mobility:    Rolling: Independent  Supine to Sit: Independent  Sit to Supine: Independent  Sit to Stand: Independent  ?? Transfers: Sit to stand from chair without UE assistance       LE STRENGTH:        LLE Strength  L Hip Flexion: 4  L Knee Flexion: 4  L Knee Extension: 4    RLE Strength  R Hip Flexion: 4  R Knee Flexion: 4  R Knee Extension: 4     SENSATION: Intact    POSTURAL  CONTROL & BALANCE OBJECTIVE MEASURES:    Berg Balance Scale:  47/56.    (A score less than 45/56 indicates high risk of falls)    Comments:  Anxious with tandem stance and single limb stance, difficulty with turning 360-degrees    Dynamic Gait Index:  22/24.    (A score less than or equal to19/24 is abnormal and predictive of falls)    Comments:  Up/down stairs with step to pattern with handrail    Development worker, community Organization Test:  Objective findings indicate Normal use of somatosensory, Decreased use of visual, & Decreased use of vestibular inputs to balance.  Center of gravity is normal.  See scanned report.    Other Smart Equitest Testing:  Motor Control Test:  All attributes within normal range.    QUALITY OF GAIT:   Base of Support: Widened  Speed/Cadence: Pace decreased (<100 feet/min)  Gait Abnormalities: Trunk sway increased  OBJECTIVE MEASURES:   Tool Used: Dynamic Gait Index  Score:  Initial: 22/24 Most Recent: X/24 (Date: -- )   Interpretation of Score: Each section is scored on a 0-3 scale, 0 representing the patient???s inability to perform the task and 3 representing independence.  The scores of each section are added together for a total score of 24.  Any score below 19 indicates increased risk for falls.  Score 24 23-19 18-15 14-10 9-5 4-1 0   Modifier CH CI CJ CK CL CM CN       Mobility - Walking and Moving Around:    Z6109 - CURRENT STATUS: CI - 1%-19% impaired, limited or restricted   U0454 - GOAL STATUS:  CI - 1%-19% impaired, limited or restricted    U9811 - D/C STATUS:  CI - 1%-19% impaired, limited or restricted    Tool Used: Berg Balance Scale  Score:  Initial: 47/56 Most Recent: X/56 (Date: -- )   Interpretation of Score: Each section is scored on a 0-4 scale, 0 representing the patient???s inability to perform the task and 4 representing independence.  The scores of each section are added together for a total score of 56.  The higher the patient???s score, the more independent the patient is.  Any score below 45 indicates increased risk for falls.    TREATMENT:    (In addition to Assessment/Re-Assessment sessions the following treatments were rendered)    ___________________________________________________  Treatment Assessment:  Patient tolerated evaluation well.  To follow-up with Dr. Maud Deed on July 3rd.      Total Treatment Duration:  PT Patient Time In/Time Out  Time In: 1256  Time Out: 1400    Georgeanna Harrison, PT

## 2013-03-26 NOTE — Progress Notes (Incomplete)
[facility]   [address], [place], [place] [address]  Phone:[phone]   Fax:[phone]  Outpatient PHYSICAL THERAPY: Initial Assessment  Fall Risk Score: 1 (? 5 = High Risk)    Treatment Diagnosis: difficulty in walking  REFERRING PHYSICIAN: Overton Mam, MD  MD Orders: Hip abductor/ quad strengthening, gait training  Return Physician Appointment: 03/27/13  MEDICAL/REFERRING DIAGNOSIS: S/P total hip arthroplasty [V43.64]  Other physical therapy [V57.1]  DATE OF ONSET: 11/14/12   PRIOR LEVEL OF FUNCTION: Patient (pt) ambulating with straight cane for long distances, living alone in apartment with no stairs to enter.    PRECAUTIONS/ALLERGIES: No Known Allergies  ASSESSMENT:  ????????This section established at most recent assessment??????????  Pt  Is an 79 year old female presenting to therapy following fall with hip fracture and subsequent right hip hemiarthroplasty.  Now demonstrating decreased range of motion (ROM)/ strength, impaired gait quality and decreased independence with ADLS.  Pt would benefit from physical therapy to address these limitations and return to previous level of function with ADLS.  Pt may be limited by mild dementia.  Pt is at high risk of falls.  PROBLEM LIST (Impairments causing functional limitations):  1. Decreased Strength affecting function  2. Decreased ADL/Functional Activities  3. Decreased Flexibility/joint mobility  GOALS: (Goals have been discussed and agreed upon with patient.)  SHORT-TERM FUNCTIONAL GOALS: Time Frame: 2 weeks  1. Pt to be independent and compliant with home exercise program for independent management of symptoms.  2. Pt will increase hip extension active ROM to 0-2 degrees in stance bilaterally for improved stride length with gait.  3. Pt to increase score on Lower  Extremity Functional Scale (LEFS) to 36/80 for improved ease of mobility and ADLs.  DISCHARGE GOALS: Time Frame:  6 weeks  1. Pt will  increase score on LEFS to 54/80 or better for increased mobility and independence with ADLs.  2. Pt will demonstrate 5/5 strength through all major LE muscle groups bilaterally.  3. Pt to ambulate safely and independence with leasr restrictive assistive device in community.  4. Pt to increased BERG Balance Test score to 45/56 for decreased risk of falls.  REHABILITATION POTENTIAL FOR STATED GOALS: GoodPLAN OF CARE:  INTERVENTIONS PLANNED: (Benefits and precautions of physical therapy have been discussed with the patient.)  1. balance exercise  2. gait training  3. home exercise program (HEP)  4. manual therapy  5. range of motion: active/assisted/passive  6. therapeutic activities  7. therapeutic exercise/strengthening  8. Aquatics  9. Modalities  TREATMENT PLAN EFFECTIVE DATES: 03/713 TO 05/24/13  FREQUENCY/DURATION: Follow patient 3 times a week for 6 weeks to address above goals.  Regarding [place] therapy, I certify that the treatment plan above will be carried out by a therapist or under their direction.  Thank you for this referral,  Donnetta Hail, PT     Referring Physician Signature: Overton Mam, MD          Date                       SUBJECTIVE:    History of Present Injury/Illness (Reason for Referral): Pt was vacuuming and fell backwards, tipping vacuum and fractured R hip.  She was in rehab until 12/29/12 was doing and therapy at [facility] until 01/23/13.  Fell onto asphalt while at [employer].  Had x-ray with no fracture.  Present Symptoms: difficulty in walking, decreased balance, impaired functional mobility,  decreased AROM/ strength      Dominant Side: right  Past Medical History: OA, falls with fracture, dizziness, tonsillectomy, appendectomy, mild dementia  Current Medications: Paxil, Panteprazol, levothyroxine   Date Last Reviewed: 03/26/2013    Social History/Home Situation: Pt lives alone in one story apartment with no stairs.  She has daughters living locally who provide  supervision during her daily activities.      Work/Activity History: ambulating with cane  OBJECTIVE:  Outcome Measure:   Tool Used: Berg Balance Scale  Score:  Initial: 32/56 Most Recent: X/56 (Date: -- )   Interpretation of Score: Each section is scored on a 0-4 scale, 0 representing the patient???s inability to perform the task and 4 representing independence.  The scores of each section are added together for a total score of 56.  The higher the patient???s score, the more independent the patient is.  Any score below 45 indicates increased risk for falls.    Score 56 55-45 44-34 33-23 22-12 11-1 0   Modifier CH CI CJ CK CL CM CN         Tool Used: Lower Extremity Functional Scale (LEFS)  Score:  Initial: 27/80 Most Recent: 56/80 (Date: 03/17/13 )   Interpretation of Score: 20 questions each scored on a 5 point scale with 0 representing "extreme difficulty or unable to perform" and 4 representing "no difficulty".  The lower the score, the greater the functional disability. 80/80 represents no disability.  Minimal detectable change is 9 points.  Score 80 79-63 62-48 47-32 31-16 15-1 0   Modifier CH CI CJ CK CL CM CN       Mobility - Walking and Moving Around:    Z6109 - CURRENT STATUS: CJ - 20%-39% impaired, limited or restricted   U0454 - GOAL STATUS:  CJ - 20%-39% impaired, limited or restricted   U9811 - D/C STATUS:  ---------------To be determined---------------    Observation/Orthostatic Postural Assessment:    flexed at hips, forward head, genu varum  Palpation:    no c/o tenderness  ROM: all others WFL except L hip extentsion -10 degrees, R hip extension -13 degrees                           Strength: grossly +4/5 or better                  Special Tests: N/T  Neurological Screen:   Myotomes: N/T                  Dermatomes: N/T                  Reflexes: N/T                  Neural Tension Tests: N/T  Functional Mobility:       Pt ambulating with rolling walker  Balance:    Berg balance 32/56- high risk of  falls  TREATMENT:    (In addition to Assessment/Re-Assessment sessions the following treatments were rendered)  Therapeutic Exercise: ( ):  Exercises per grid below to improve mobility and balance.  Required minimal visual and verbal cues to promote proper body posture and promote proper body mechanics.  Progressed resistance, range, repetitions and complexity of movement as indicated.  Aquatic Therapy (*** minutes): Aquatic treatment performed per flow grid for {PT AQUATIC BJYNWGN:[id number]}.  Cues provided for ***.  Assistance by therapist provided for ***.  Patient has difficulty  with ***.     Date: ***       Modalities:        ***                        Manual Therapy:        ***                        Aquatic Exercise:        ***                                                                        Therapeutic Exercise:        Gait with head turns        Balance: NBOS with EO/EC          NBOS with head turns        Partial Tandem EO/EC        Partial Tandem with Head turns        SLS- even        SLS- air ex        BLE rockerboard balance        Dynamic standing with wt ball  UE/ LE                    Manual Therapy (     ***): ***  Therapeutic Modalities:                                                                                               HEP: As above; handouts given to patient for all exercises.  ______________________________________________________________________________________________________    Treatment Assessment:  ***.  Progression/Medical Necessity:   ?? Patient is expected to demonstrate progress in balance to improve safety during functional mobility..  Compliance with Program/Exercises: Will assess as treatment progresses.   Reason for Continuation of Services/Other Comments:  ?? Patient continues to demonstrate capacity to improve balance which will increase independence and increase safety.  Recommendations/Intent for next treatment session: "Treatment next visit will focus on  advancements to more challenging activities".  ***  Total Treatment Duration:       Donnetta Hail, PT

## 2013-03-26 NOTE — Progress Notes (Signed)
Ambulatory/Rehab Services H2 Model Falls Risk Assessment    Risk Factor Pts. ??   Confusion/Disorientation/Impulsivity  []    4 ??   Symptomatic Depression  []   2 ??   Altered Elimination  []   1 ??   Dizziness/Vertigo  []   1 ??   Gender (Female)  []   1 ??   Any administered antiepileptics (anticonvulsants):  []   2 ??   Any administered benzodiazepines:  []   1 ??   Visual Impairment (specify):  []   1 ??   Portable Oxygen Use  []   1 ??   Orthostatic ? BP  []   1 ??   History of Recent Falls (within 3 mos.)  []   5     Ability to Rise from Chair (choose one) Pts. ??   Ability to rise in a single movement  []   0 ??   Pushes up, successful in one attempt  [x]   1 ??   Multiple attempts, but successful  []   3 ??   Unable to rise without assistance  []   4   Total: (5 or greater = High Risk) 1     Falls Prevention Plan:   []                Physical Limitations to Exercise (specify):   []                Mobility Assistance Device (type):   []                Exercise/Equipment Adaptation (specify):    ??2010 [employer] All Rights Reserved. [place] Patent #[id number]. Federal Law prohibits the replication, distribution or use without written permission from [employer]

## 2013-03-31 NOTE — Progress Notes (Signed)
[facility]   [address], [place], [place] [address]  Phone:[phone]   Fax:[phone]  Outpatient PHYSICAL THERAPY: Initial Assessment  Fall Risk Score: 1 (? 5 = High Risk)       REFERRING PHYSICIAN: Overton Mam, MD  MD Orders: Hip abductor/ quad strengthening, gait training  Return Physician Appointment: 03/27/13  MEDICAL/REFERRING DIAGNOSIS: S/P total hip arthroplasty [V43.64]  Other physical therapy [V57.1]  DATE OF ONSET: 11/14/12   PRIOR LEVEL OF FUNCTION: Patient (pt) ambulating with straight cane for long distances, living alone in apartment with no stairs to enter.    PRECAUTIONS/ALLERGIES: No Known Allergies  ASSESSMENT:  ????????This section established at most recent assessment??????????  Pt  Is an 79 year old female presenting to therapy following fall with hip fracture and subsequent right hip hemiarthroplasty.  Now demonstrating decreased range of motion (ROM)/ strength, impaired gait quality and decreased independence with ADLS.  Pt would benefit from physical therapy to address these limitations and return to previous level of function with ADLS.  Pt may be limited by mild dementia.  Pt is at high risk of falls.  PROBLEM LIST (Impairments causing functional limitations):  1. Decreased Strength affecting function  2. Decreased ADL/Functional Activities  3. Decreased Flexibility/joint mobility  GOALS: (Goals have been discussed and agreed upon with patient.)  SHORT-TERM FUNCTIONAL GOALS: Time Frame: 2 weeks  1. Pt to be independent and compliant with home exercise program for independent management of symptoms.  2. Pt will increase hip extension active ROM to 0-2 degrees in stance bilaterally for improved stride length with gait.  3. Pt to increase score on Lower  Extremity Functional Scale (LEFS) to 36/80 for improved ease of mobility and ADLs.  DISCHARGE GOALS: Time Frame:  6 weeks  1. Pt will increase score on LEFS to 54/80 or better for  increased mobility and independence with ADLs.  2. Pt will demonstrate 5/5 strength through all major LE muscle groups bilaterally.  3. Pt to ambulate safely and independence with leasr restrictive assistive device in community.  4. Pt to increased BERG Balance Test score to 45/56 for decreased risk of falls.  REHABILITATION POTENTIAL FOR STATED GOALS: GoodPLAN OF CARE:  INTERVENTIONS PLANNED: (Benefits and precautions of physical therapy have been discussed with the patient.)  1. balance exercise  2. gait training  3. home exercise program (HEP)  4. manual therapy  5. range of motion: active/assisted/passive  6. therapeutic activities  7. therapeutic exercise/strengthening  8. Aquatics  9. Modalities  TREATMENT PLAN EFFECTIVE DATES: 03/713 TO 05/24/13  FREQUENCY/DURATION: Follow patient 3 times a week for 6 weeks to address above goals.  Regarding [place] therapy, I certify that the treatment plan above will be carried out by a therapist or under their direction.  Thank you for this referral,  Donnetta Hail, PT     Referring Physician Signature: Overton Mam, MD          Date                       SUBJECTIVE:    History of Present Injury/Illness (Reason for Referral): Pt was vacuuming and fell backwards, tipping vacuum and fractured R hip.  She was in rehab until 12/29/12 was doing and therapy at [facility] until 01/23/13.  Fell onto asphalt while at [employer].  Had x-ray with no fracture.  Present Symptoms: difficulty in walking, decreased balance, impaired functional mobility, decreased AROM/ strength  Pain Intensity 1: 3  Pain Location 1: Hip  Pain Orientation 1: Proximal  Pain Intervention(s) 1: Cold pack;Exercise  Dominant Side: right  Past Medical History: OA, falls with fracture, dizziness, tonsillectomy, appendectomy, mild dementia  Current Medications: Paxil, Panteprazol, levothyroxine   Date Last Reviewed: 04/01/2013    Social History/Home Situation: Pt lives alone in one story  apartment with no stairs.  She has daughters living locally who provide supervision during her daily activities.      Work/Activity History: ambulating with cane  OBJECTIVE:  Outcome Measure:   Tool Used: Berg Balance Scale  Score:  Initial: 32/56 Most Recent: X/56 (Date: -- )   Interpretation of Score: Each section is scored on a 0-4 scale, 0 representing the patient???s inability to perform the task and 4 representing independence.  The scores of each section are added together for a total score of 56.  The higher the patient???s score, the more independent the patient is.  Any score below 45 indicates increased risk for falls.    Score 56 55-45 44-34 33-23 22-12 11-1 0   Modifier CH CI CJ CK CL CM CN         Tool Used: Lower Extremity Functional Scale (LEFS)  Score:  Initial: 27/80 Most Recent: 56/80 (Date: 03/17/13 )   Interpretation of Score: 20 questions each scored on a 5 point scale with 0 representing "extreme difficulty or unable to perform" and 4 representing "no difficulty".  The lower the score, the greater the functional disability. 80/80 represents no disability.  Minimal detectable change is 9 points.  Score 80 79-63 62-48 47-32 31-16 15-1 0   Modifier CH CI CJ CK CL CM CN       Mobility - Walking and Moving Around:    V4098 - CURRENT STATUS: CJ - 20%-39% impaired, limited or restricted   J1914 - GOAL STATUS:  CJ - 20%-39% impaired, limited or restricted   N8295 - D/C STATUS:  ---------------To be determined---------------    Observation/Orthostatic Postural Assessment:    flexed at hips, forward head, genu varum  Palpation:    no c/o tenderness  ROM: all others WFL except L hip extentsion -10 degrees, R hip extension -13 degrees                           Strength: grossly +4/5 or better                  Special Tests: N/T  Neurological Screen:   Myotomes: N/T                  Dermatomes: N/T                  Reflexes: N/T                  Neural Tension Tests: N/T  Functional Mobility:       Pt  ambulating with rolling walker  :  TREATMENT:    (In addition to Assessment/Re-Assessment sessions the following treatments were rendered)  Therapeutic Activity ( 40 ):  Exercises per grid below to improve mobility and balance.  Required minimal visual and verbal cues to promote proper body posture and promote proper body mechanics.  Progressed resistance, range, repetitions and complexity of movement as indicated.                    Therapeutic Exercise:  Gait with head turns X 43ft       Balance: NBOS with EO/EC   Air ex x4 10 sec       NBOS with head turns Air ex x4 10 sec       Partial Tandem EO/EC Solid x 4 10 sec       Partial Tandem with Head turns Solid x4 10 sec       SLS- even _________       SLS- air ex _________       BLE rockerboard balance ________       Dynamic standing with wt ball  UE/ LE x10        dyna disc seated march X 10       Dyna disc seated alt UE/LE x10       Dyna disc EO/EC X 3 10 sec       Dyna disc head turns X 10 ea       March on airex x2 20 steps ea       Wt shifts on Aireex Ant/Post  And lateral X 10       Sit- stand X 15       EO=eye open, EC= eye closed,  HEP: As above; handouts given to patient for all exercises.  ______________________________________________________________________________________________________    Treatment Assessment:  Pt tol balance activities well today needed HHA with some of the activities due to LOB.   Progression/Medical Necessity:   ?? Patient is expected to demonstrate progress in balance to improve safety during functional mobility..  Compliance with Program/Exercises: Will assess as treatment progresses.   Reason for Continuation of Services/Other Comments:  ?? Patient continues to demonstrate capacity to improve balance which will increase independence and increase safety.  Recommendations/Intent for next treatment session: "Treatment next visit will focus on advancements to more challenging activities".    Total Treatment Duration:  PT  Patient Time In/Time Out  Time In: 1154  Time Out: 1240    Donnetta Hail, PT

## 2013-03-31 NOTE — Progress Notes (Signed)
[facility]   [address] [place], [place] [address]  Phone:[phone]   Fax:[phone]  Outpatient PHYSICAL THERAPY: Initial Assessment  Fall Risk Score: 1 (? 5 = High Risk)       REFERRING PHYSICIAN: Overton Mam, MD  MD Orders: Hip abductor/ quad strengthening, gait training  Return Physician Appointment: 03/27/13  MEDICAL/REFERRING DIAGNOSIS: S/P total hip arthroplasty [V43.64]  Other physical therapy [V57.1]  DATE OF ONSET: 11/14/12   PRIOR LEVEL OF FUNCTION: Patient (pt) ambulating with straight cane for long distances, living alone in apartment with no stairs to enter.    PRECAUTIONS/ALLERGIES: No Known Allergies  ASSESSMENT:  ????????This section established at most recent assessment??????????  Pt  Is an 79 year old female presenting to therapy following fall with hip fracture and subsequent right hip hemiarthroplasty.  Now demonstrating decreased range of motion (ROM)/ strength, impaired gait quality and decreased independence with ADLS.  Pt would benefit from physical therapy to address these limitations and return to previous level of function with ADLS.  Pt may be limited by mild dementia.  Pt is at high risk of falls.  PROBLEM LIST (Impairments causing functional limitations):  1. Decreased Strength affecting function  2. Decreased ADL/Functional Activities  3. Decreased Flexibility/joint mobility  GOALS: (Goals have been discussed and agreed upon with patient.)  SHORT-TERM FUNCTIONAL GOALS: Time Frame: 2 weeks  1. Pt to be independent and compliant with home exercise program for independent management of symptoms.  2. Pt will increase hip extension active ROM to 0-2 degrees in stance bilaterally for improved stride length with gait.  3. Pt to increase score on Lower  Extremity Functional Scale (LEFS) to 36/80 for improved ease of mobility and ADLs.  DISCHARGE GOALS: Time Frame:  6 weeks  1. Pt will increase score on LEFS to 54/80 or better for  increased mobility and independence with ADLs.  2. Pt will demonstrate 5/5 strength through all major LE muscle groups bilaterally.  3. Pt to ambulate safely and independence with leasr restrictive assistive device in community.  4. Pt to increased BERG Balance Test score to 45/56 for decreased risk of falls.  REHABILITATION POTENTIAL FOR STATED GOALS: GoodPLAN OF CARE:  INTERVENTIONS PLANNED: (Benefits and precautions of physical therapy have been discussed with the patient.)  1. balance exercise  2. gait training  3. home exercise program (HEP)  4. manual therapy  5. range of motion: active/assisted/passive  6. therapeutic activities  7. therapeutic exercise/strengthening  8. Aquatics  9. Modalities  TREATMENT PLAN EFFECTIVE DATES: 03/713 TO 05/24/13  FREQUENCY/DURATION: Follow patient 3 times a week for 6 weeks to address above goals.  Regarding [place] therapy, I certify that the treatment plan above will be carried out by a therapist or under their direction.  Thank you for this referral,  Jo Simmons, PTA     Referring Physician Signature: Jo Mam, MD          Date                       SUBJECTIVE:    History of Present Injury/Illness (Reason for Referral): Pt was vacuuming and fell backwards, tipping vacuum and fractured R hip.  She was in rehab until 12/29/12 was doing and therapy at [facility] until 01/23/13.  Fell onto asphalt while at [employer].  Had x-ray with no fracture.  Present Symptoms: difficulty in walking, decreased balance, impaired functional mobility, decreased AROM/ strength  Pain Intensity 1: 3  Pain Location 1: Hip  Pain Orientation 1: Proximal  Pain Intervention(s) 1: Cold pack;Exercise  Dominant Side: right  Past Medical History: OA, falls with fracture, dizziness, tonsillectomy, appendectomy, mild dementia  Current Medications: Paxil, Panteprazol, levothyroxine   Date Last Reviewed: 03/31/2013    Social History/Home Situation: Pt lives alone in one story  apartment with no stairs.  She has daughters living locally who provide supervision during her daily activities.      Work/Activity History: ambulating with cane  OBJECTIVE:  Outcome Measure:   Tool Used: Berg Balance Scale  Score:  Initial: 32/56 Most Recent: X/56 (Date: -- )   Interpretation of Score: Each section is scored on a 0-4 scale, 0 representing the patient???s inability to perform the task and 4 representing independence.  The scores of each section are added together for a total score of 56.  The higher the patient???s score, the more independent the patient is.  Any score below 45 indicates increased risk for falls.    Score 56 55-45 44-34 33-23 22-12 11-1 0   Modifier CH CI CJ CK CL CM CN         Tool Used: Lower Extremity Functional Scale (LEFS)  Score:  Initial: 27/80 Most Recent: 56/80 (Date: 03/17/13 )   Interpretation of Score: 20 questions each scored on a 5 point scale with 0 representing "extreme difficulty or unable to perform" and 4 representing "no difficulty".  The lower the score, the greater the functional disability. 80/80 represents no disability.  Minimal detectable change is 9 points.  Score 80 79-63 62-48 47-32 31-16 15-1 0   Modifier CH CI CJ CK CL CM CN       Mobility - Walking and Moving Around:    Z6109 - CURRENT STATUS: CJ - 20%-39% impaired, limited or restricted   U0454 - GOAL STATUS:  CJ - 20%-39% impaired, limited or restricted   U9811 - D/C STATUS:  ---------------To be determined---------------    Observation/Orthostatic Postural Assessment:    flexed at hips, forward head, genu varum  Palpation:    no c/o tenderness  ROM: all others WFL except L hip extentsion -10 degrees, R hip extension -13 degrees                           Strength: grossly +4/5 or better                  Special Tests: N/T  Neurological Screen:   Myotomes: N/T                  Dermatomes: N/T                  Reflexes: N/T                  Neural Tension Tests: N/T  Functional Mobility:       Pt  ambulating with rolling walker  :  TREATMENT:    (In addition to Assessment/Re-Assessment sessions the following treatments were rendered)  Therapeutic Activity ( 40 ):  Exercises per grid below to improve mobility and balance.  Required minimal visual and verbal cues to promote proper body posture and promote proper body mechanics.  Progressed resistance, range, repetitions and complexity of movement as indicated.                    Therapeutic Exercise:  Gait with head turns X 37ft       Balance: NBOS with EO/EC   Air ex x4 10 sec       NBOS with head turns Air ex x4 10 sec       Partial Tandem EO/EC Solid x 4 10 sec       Partial Tandem with Head turns Solid x4 10 sec       SLS- even _________       SLS- air ex _________       BLE rockerboard balance ________       Dynamic standing with wt ball  UE/ LE x10        dyna disc seated march X 10       Dyna disc seated alt UE/LE x10       Dyna disc EO/EC X 3 10 sec       Dyna disc head turns X 10 ea       March on airex x2 20 steps ea       Wt shifts on Aireex Ant/Post  And lateral X 10       Sit- stand X 15       EO=eye open, EC= eye closed,  HEP: As above; handouts given to patient for all exercises.  ______________________________________________________________________________________________________    Treatment Assessment:  Pt tol balance activities well today needed HHA with some of the activities due to LOB.   Progression/Medical Necessity:   ?? Patient is expected to demonstrate progress in balance to improve safety during functional mobility..  Compliance with Program/Exercises: Will assess as treatment progresses.   Reason for Continuation of Services/Other Comments:  ?? Patient continues to demonstrate capacity to improve balance which will increase independence and increase safety.  Recommendations/Intent for next treatment session: "Treatment next visit will focus on advancements to more challenging activities".    Total Treatment Duration:  PT  Patient Time In/Time Out  Time In: 1154  Time Out: [address] [place]

## 2013-04-02 NOTE — Progress Notes (Addendum)
[facility]   [address] [place], [place] [address]  [phone]   [phone]  Outpatient PHYSICAL THERAPY: Daily Note Visit#2  Fall Risk Score: 1 (? 5 = High Risk)       REFERRING PHYSICIAN: Overton Mam, MD  MD Orders: Hip abductor/ quad strengthening, gait training  Return Physician Appointment: 03/27/13  MEDICAL/REFERRING DIAGNOSIS: S/P total hip arthroplasty [V43.64]  Other physical therapy [V57.1]  DATE OF ONSET: 11/14/12   PRIOR LEVEL OF FUNCTION: Patient (pt) ambulating with straight cane for long distances, living alone in apartment with no stairs to enter.    PRECAUTIONS/ALLERGIES: No Known Allergies  ASSESSMENT:  ????????This section established at most recent assessment??????????  Pt  Is an 79 year old female presenting to therapy following fall with hip fracture and subsequent right hip hemiarthroplasty.  Now demonstrating decreased range of motion (ROM)/ strength, impaired gait quality and decreased independence with ADLS.  Pt would benefit from physical therapy to address these limitations and return to previous level of function with ADLS.  Pt may be limited by mild dementia.  Pt is at high risk of falls.  PROBLEM LIST (Impairments causing functional limitations):  1. Decreased Strength affecting function  2. Decreased ADL/Functional Activities  3. Decreased Flexibility/joint mobility  GOALS: (Goals have been discussed and agreed upon with patient.)  SHORT-TERM FUNCTIONAL GOALS: Time Frame: 2 weeks  1. Pt to be independent and compliant with home exercise program for independent management of symptoms.  2. Pt will increase hip extension active ROM to 0-2 degrees in stance bilaterally for improved stride length with gait.  3. Pt to increase score on Lower  Extremity Functional Scale (LEFS) to 36/80 for improved ease of mobility and ADLs.  DISCHARGE GOALS: Time Frame:  6 weeks  1. Pt will increase score on LEFS to 54/80 or better for  increased mobility and independence with ADLs.  2. Pt will demonstrate 5/5 strength through all major LE muscle groups bilaterally.  3. Pt to ambulate safely and independence with leasr restrictive assistive device in community.  4. Pt to increased BERG Balance Test score to 45/56 for decreased risk of falls.  REHABILITATION POTENTIAL FOR STATED GOALS: GoodPLAN OF CARE:  INTERVENTIONS PLANNED: (Benefits and precautions of physical therapy have been discussed with the patient.)  1. balance exercise  2. gait training  3. home exercise program (HEP)  4. manual therapy  5. range of motion: active/assisted/passive  6. therapeutic activities  7. therapeutic exercise/strengthening  8. Aquatics  9. Modalities  TREATMENT PLAN EFFECTIVE DATES: 03/713 TO 05/24/13  FREQUENCY/DURATION: Follow patient 3 times a week for 6 weeks to address above goals.  Regarding [place] therapy, I certify that the treatment plan above will be carried out by a therapist or under their direction.  Thank you for this referral,  Agustina Caroli, PTA     Referring Physician Signature: Overton Mam, MD          Date                       SUBJECTIVE:    History of Present Injury/Illness (Reason for Referral): Pt was vacuuming and fell backwards, tipping vacuum and fractured R hip.  She was in rehab until 12/29/12 was doing and therapy at [facility] until 01/23/13.  Fell onto asphalt while at [employer].  Had x-ray with no fracture.  Present Symptoms: difficulty in walking, decreased balance, impaired functional mobility, decreased AROM/  strength   Pain Intensity 1: 0  Pain Location 1: Hip  Dominant Side: right  Past Medical History: OA, falls with fracture, dizziness, tonsillectomy, appendectomy, mild dementia  Current Medications: Paxil, Panteprazol, levothyroxine   Date Last Reviewed: 04/02/2013    Social History/Home Situation: Pt lives alone in one story apartment with no stairs.  She has daughters living locally who provide  supervision during her daily activities.      Work/Activity History: ambulating with cane  OBJECTIVE:  Outcome Measure:   Tool Used: Berg Balance Scale  Score:  Initial: 32/56 Most Recent: X/56 (Date: -- )   Interpretation of Score: Each section is scored on a 0-4 scale, 0 representing the patient???s inability to perform the task and 4 representing independence.  The scores of each section are added together for a total score of 56.  The higher the patient???s score, the more independent the patient is.  Any score below 45 indicates increased risk for falls.    Score 56 55-45 44-34 33-23 22-12 11-1 0   Modifier CH CI CJ CK CL CM CN         Tool Used: Lower Extremity Functional Scale (LEFS)  Score:  Initial: 27/80 Most Recent: 56/80 (Date: 03/17/13 )   Interpretation of Score: 20 questions each scored on a 5 point scale with 0 representing "extreme difficulty or unable to perform" and 4 representing "no difficulty".  The lower the score, the greater the functional disability. 80/80 represents no disability.  Minimal detectable change is 9 points.  Score 80 79-63 62-48 47-32 31-16 15-1 0   Modifier CH CI CJ CK CL CM CN       Mobility - Walking and Moving Around:    E4540 - CURRENT STATUS: CJ - 20%-39% impaired, limited or restricted   J8119 - GOAL STATUS:  CJ - 20%-39% impaired, limited or restricted   J4782 - D/C STATUS:  ---------------To be determined---------------    Observation/Orthostatic Postural Assessment:    flexed at hips, forward head, genu varum  Palpation:    no c/o tenderness  ROM: all others WFL except L hip extentsion -10 degrees, R hip extension -13 degrees                           Strength: grossly +4/5 or better                  Special Tests: N/T  Neurological Screen:   Myotomes: N/T                  Dermatomes: N/T                  Reflexes: N/T                  Neural Tension Tests: N/T  Functional Mobility:       Pt ambulating with rolling walker  :  TREATMENT:    (In addition to  Assessment/Re-Assessment sessions the following treatments were rendered)    Neuromuscular Re-education: (  45 min):  Exercise/activities per grid below to improve balance, coordination and proprioception.  Required minimal verbal and manual cues to promote static and dynamic balance in standing.       03/31/13 04/02/13              Neuromuscular Re-ed 40 45      Gait with head turns X 38ft X 40 ft  Balance: NBOS with EO/EC   Air ex x4 10 sec Airex x4      NBOS with head turns Air ex x4 10 sec Solid x 4 10 sec      Partial Tandem EO/EC Solid x 4 10 sec Solid x 10sec x 4      Partial Tandem with Head turns Solid x4 10 sec Solid x4 10 sec      SLS- even _________       SLS- air ex _________       BLE rockerboard balance ________       Dynamic standing with wt ball  UE/ LE x10  6# ball x 5 with forward lean, and lateral      dyna disc seated march X 10 X10      Dyna disc seated alt UE/LE x10 X 10      Dyna disc EO/EC X 3 10 sec ____      Dyna disc head turns with feet on airex X 10 ea  x 10ea      March on airex x2 20 steps ea X 2 20 sec      Wt shifts on Aireex Ant/Post  And lateral X 10 X 10      Sit- stand X 15 X 15      EO=eye open, EC= eye closed, NBOS= Narrow base of support.  HEP: As above; handouts given to patient for all exercises.  ______________________________________________________________________________________________________    Treatment Assessment:  Pt tol balance activities well today needed HHA with some of the activities for safety , pt showed improvement in head turns with NBOS  Today.   Progression/Medical Necessity:   ?? Patient is expected to demonstrate progress in balance to improve safety during functional mobility..  Compliance with Program/Exercises: Will assess as treatment progresses.   Reason for Continuation of Services/Other Comments:  ?? Patient continues to demonstrate capacity to improve balance which will increase independence and increase safety.  Recommendations/Intent for next  treatment session: "Treatment next visit will focus on advancements to more challenging activities".    Total Treatment Duration: 45 min  PT Patient Time In/Time Out  Time In: 1148  Time Out: [address] [place]

## 2013-04-04 LAB — AMB POC URINALYSIS DIP STICK AUTO W/ MICRO

## 2013-04-04 NOTE — Progress Notes (Signed)
See procedure note/dh

## 2013-04-04 NOTE — Progress Notes (Addendum)
[facility]   [address] [place], [place] [address]  Phone:[phone]   Fax:[phone]  Outpatient PHYSICAL THERAPY: Daily Note Visit#3  Fall Risk Score: 1 (? 5 = High Risk)       REFERRING PHYSICIAN: Overton Mam, MD  MD Orders: Hip abductor/ quad strengthening, gait training  Return Physician Appointment: 03/27/13  MEDICAL/REFERRING DIAGNOSIS: S/P total hip arthroplasty [V43.64]  Other physical therapy [V57.1]  DATE OF ONSET: 11/14/12   PRIOR LEVEL OF FUNCTION: Patient (pt) ambulating with straight cane for long distances, living alone in apartment with no stairs to enter.    PRECAUTIONS/ALLERGIES: No Known Allergies  ASSESSMENT:  ????????This section established at most recent assessment??????????  Pt  Is an 79 year old female presenting to therapy following fall with hip fracture and subsequent right hip hemiarthroplasty.  Now demonstrating decreased range of motion (ROM)/ strength, impaired gait quality and decreased independence with ADLS.  Pt would benefit from physical therapy to address these limitations and return to previous level of function with ADLS.  Pt may be limited by mild dementia.  Pt is at high risk of falls.  PROBLEM LIST (Impairments causing functional limitations):  1. Decreased Strength affecting function  2. Decreased ADL/Functional Activities  3. Decreased Flexibility/joint mobility  GOALS: (Goals have been discussed and agreed upon with patient.)  SHORT-TERM FUNCTIONAL GOALS: Time Frame: 2 weeks  1. Pt to be independent and compliant with home exercise program for independent management of symptoms.  2. Pt will increase hip extension active ROM to 0-2 degrees in stance bilaterally for improved stride length with gait.  3. Pt to increase score on Lower  Extremity Functional Scale (LEFS) to 36/80 for improved ease of mobility and ADLs.  DISCHARGE GOALS: Time Frame:  6 weeks  1. Pt will increase score on LEFS to 54/80 or better for  increased mobility and independence with ADLs.  2. Pt will demonstrate 5/5 strength through all major LE muscle groups bilaterally.  3. Pt to ambulate safely and independence with leasr restrictive assistive device in community.  4. Pt to increased BERG Balance Test score to 45/56 for decreased risk of falls.  REHABILITATION POTENTIAL FOR STATED GOALS: GoodPLAN OF CARE:  INTERVENTIONS PLANNED: (Benefits and precautions of physical therapy have been discussed with the patient.)  1. balance exercise  2. gait training  3. home exercise program (HEP)  4. manual therapy  5. range of motion: active/assisted/passive  6. therapeutic activities  7. therapeutic exercise/strengthening  8. Aquatics  9. Modalities  TREATMENT PLAN EFFECTIVE DATES: 03/713 TO 05/24/13  FREQUENCY/DURATION: Follow patient 3 times a week for 6 weeks to address above goals.  Regarding [place] therapy, I certify that the treatment plan above will be carried out by a therapist or under their direction.  Thank you for this referral,  Agustina Caroli, PTA     Referring Physician Signature: Overton Mam, MD          Date                       SUBJECTIVE:    History of Present Injury/Illness (Reason for Referral): Pt was vacuuming and fell backwards, tipping vacuum and fractured R hip.  She was in rehab until 12/29/12 was doing and therapy at [facility] until 01/23/13.  Fell onto asphalt while at [employer] . Had x-ray with no  New fracture.  Present Symptoms:  Pt had no  C/o pain , daughter  stated she doing better but still struggles with balance when distracted.  Pain Intensity 1: 0  Pain Location 1: Hip  Dominant Side: right  Past Medical History: OA, falls with fracture, dizziness, tonsillectomy, appendectomy, mild dementia  Current Medications: Current outpatient prescriptions:donepezil (ARICEPT) 5 mg tablet, , Disp: , Rfl: ;  omeprazole (PRILOSEC) 20 mg capsule, , Disp: , Rfl: ;  L-Methylfolate-B12-Acetylcyst (CEREFOLIN NAC) tablet, Take 1  Tab by mouth daily., Disp: 90 Each, Rfl: 4;  budesonide-formoterol (SYMBICORT) 160-4.5 mcg/actuation HFA inhaler, Take 2 Puffs by inhalation two (2) times a day., Disp: 1 Inhaler, Rfl: 1  PARoxetine (PAXIL) 20 mg tablet, Take 1 Tab by mouth daily., Disp: 90 Tab, Rfl: 3;  pantoprazole (PROTONIX) 40 mg tablet, Take 1 Tab by mouth daily., Disp: 30 Tab, Rfl: 5;  ergocalciferol (ERGOCALCIFEROL) 50,000 unit capsule, Take 50,000 Units by mouth every seven (7) days., Disp: , Rfl: ;  estradiol (ESTRACE) 0.01 % (0.1 mg/gram) vaginal cream, Insert 2 g into vagina. mon wed  fri, Disp: , Rfl:   docusate sodium (COLACE) 100 mg capsule, Take 100 mg by mouth two (2) times a day., Disp: , Rfl: ;  calcium carbonate (CALTREX) 600 mg (1,500 mg) tablet, Take 600 mg by mouth two (2) times a day., Disp: , Rfl: ;  cyanocobalamin (VITAMIN B12) 1,000 mcg/mL injection, 1,000 mcg by IntraMUSCular route every thirty (30) days., Disp: , Rfl: ;  levothyroxine (SYNTHROID) 50 mcg tablet, Take  by mouth Daily (before breakfast)., Disp: , Rfl:   MULTIVITAMIN WITH MINERALS (ONE-A-DAY 50 PLUS PO), Take  by mouth., Disp: , Rfl:      Date Last Reviewed: 04/04/2013    Social History/Home Situation: Pt lives alone in one story apartment with no stairs.  She has daughters living locally who provide supervision during her daily activities.      Work/Activity History: ambulating with cane  OBJECTIVE:  Outcome Measure:   Tool Used: Berg Balance Scale  Score:  Initial: 32/56 Most Recent: X/56 (Date: -- )   Interpretation of Score: Each section is scored on a 0-4 scale, 0 representing the patient???s inability to perform the task and 4 representing independence.  The scores of each section are added together for a total score of 56.  The higher the patient???s score, the more independent the patient is.  Any score below 45 indicates increased risk for falls.    Score 56 55-45 44-34 33-23 22-12 11-1 0   Modifier CH CI CJ CK CL CM CN         Tool Used: Lower Extremity  Functional Scale (LEFS)  Score:  Initial: 27/80 Most Recent: 56/80 (Date: 03/17/13 )   Interpretation of Score: 20 questions each scored on a 5 point scale with 0 representing "extreme difficulty or unable to perform" and 4 representing "no difficulty".  The lower the score, the greater the functional disability. 80/80 represents no disability.  Minimal detectable change is 9 points.  Score 80 79-63 62-48 47-32 31-16 15-1 0   Modifier CH CI CJ CK CL CM CN       Mobility - Walking and Moving Around:    W2956 - CURRENT STATUS: CJ - 20%-39% impaired, limited or restricted   O1308 - GOAL STATUS:  CJ - 20%-39% impaired, limited or restricted   M5784 - D/C STATUS:  ---------------To be determined---------------    Observation/Orthostatic Postural Assessment:    flexed at hips, forward head, genu varum  Palpation:    no c/o tenderness  ROM:  all others WFL except L hip extentsion -10 degrees, R hip extension -13 degrees                           Strength: grossly +4/5 or better                  Special Tests: N/T  Neurological Screen:   Myotomes: N/T                  Dermatomes: N/T                  Reflexes: N/T                  Neural Tension Tests: N/T  Functional Mobility:       Pt ambulating with rolling walker  :  TREATMENT:    (In addition to Assessment/Re-Assessment sessions the following treatments were rendered)    Neuromuscular Re-education: (  45 min):  Exercise/activities per grid below to improve balance, coordination and proprioception.  Required minimal verbal and manual cues to promote static and dynamic balance in standing.       03/31/13 04/02/13 04/04/13             Neuromuscular Re-ed 40 45 50     Gait with head turns X 6ft X 40 ft  x 40 ft     Balance: NBOS with EO/EC   Air ex x4 10 sec Airex x4  air ex  x 4     NBOS with head turns Air ex x4 10 sec Solid x 4 10 sec  solid x 4 10 sec     Partial Tandem EO/EC Solid x 4 10 sec Solid x 10sec x 4 Solid x 4 10 sec     Partial Tandem with Head turns Solid x4 10  sec Solid x4 10 sec  solid x 4 10 sec     SLS- even _________  3 hold 15     SLS- air ex _________  3 hold 15     BLE rockerboard balance ________   x 3 hold 20 sec     Dynamic standing with wt ball  UE/ LE x10  6# ball x 5 with forward lean, and lateral  soccer ball with forward lean and lateral     dyna disc seated march X 10 X10 X 10 with rockerboard under feeet     Dyna disc seated alt UE/LE x10 X 10 X 10     Dyna disc EO/EC X 3 10 sec ____ _____     Dyna disc head turns with feet on airex X 10 ea  x 10ea X 10 ea     March on airex x2 20 steps ea X 2 20 sec x2 30 sec     Wt shifts on Aireex Ant/Post  And lateral X 10 X 10 X10     LAQ   4# x 15     Sit- stand X 15 X 15 X 15 no UE     EO=eye open, EC= eye closed, NBOS= Narrow base of support.  HEP: As above; handouts given to patient for all exercises.  ______________________________________________________________________________________________________    Treatment Assessment:  Pt tol balance activities well today needed HHA with some of the activities for safety. Pt distracts easy , needs lots of  Cues to stay on task and focus on balance.  Progression/Medical Necessity:   ?? Patient is expected  to demonstrate progress in balance to improve safety during functional mobility..  Compliance with Program/Exercises: Will assess as treatment progresses.   Reason for Continuation of Services/Other Comments:  ?? Patient continues to demonstrate capacity to improve balance which will increase independence and increase safety.  Recommendations/Intent for next treatment session: "Treatment next visit will focus on advancements to more challenging activities".    Total Treatment Duration: 50  PT Patient Time In/Time Out  Time In: 1530  Time Out: [address], Yolinda Duerr

## 2013-04-04 NOTE — Procedures (Signed)
[facility]  [address] [place] [address]  [phone]    Jo Simmons  DOB: 05/03/1928         HPI   79 y.o., female presents for cystoscopy theophylline patient was having some spotting from her vaginal area.  She saw Dr. Griffin who felt that this was benign prolapsed urethral tissue and she is sent here for evaluation.  The patient denies dysuria, gross hematuria, or recent spotting.The patient does have short-term memory loss and congestive heart failure both of which are stable.    Past Medical History   Diagnosis Date   ??? Anemia    ??? Chronic obstructive pulmonary disease    ??? GERD (gastroesophageal reflux disease)    ??? Depression    ??? Arthritis    ??? Thyroid disease      hypothyroid   ??? Congestive heart failure, unspecified    ??? Cardiomegaly    ??? HH (hiatus hernia)    ??? Psychotic disorder      memory loss- short term   ??? Depression    ??? Osteoarthritis    ??? Gastritis    ??? Heart abnormality      cardiomegaly    ??? Anemia, pernicious    ??? Hiatal hernia      Past Surgical History   Procedure Laterality Date   ??? Hx hip replacement       right side   ??? Hx endoscopy     ??? Hx urological     ??? Hx gi     ??? Pr cardiac surg procedure unlist     ??? Hx orthopaedic       right hip replacement   ??? Hx appendectomy     ??? Hx tonsillectomy     ??? Hx tonsillectomy     ??? Pr appendectomy     ??? Hx hip replacement  11/15/2012     Current Outpatient Prescriptions   Medication Sig Dispense Refill   ??? donepezil (ARICEPT) 5 mg tablet        ??? omeprazole (PRILOSEC) 20 mg capsule        ??? L-Methylfolate-B12-Acetylcyst (CEREFOLIN NAC) tablet Take 1 Tab by mouth daily.  90 Each  4   ??? budesonide-formoterol (SYMBICORT) 160-4.5 mcg/actuation HFA inhaler Take 2 Puffs by inhalation two (2) times a day.  1 Inhaler  1   ??? PARoxetine (PAXIL) 20 mg tablet Take 1 Tab by mouth daily.  90 Tab  3   ??? pantoprazole (PROTONIX) 40 mg tablet Take 1 Tab by mouth daily.  30 Tab  5   ??? ergocalciferol (ERGOCALCIFEROL) 50,000 unit capsule Take  50,000 Units by mouth every seven (7) days.       ??? estradiol (ESTRACE) 0.01 % (0.1 mg/gram) vaginal cream Insert 2 g into vagina. mon wed  fri       ??? docusate sodium (COLACE) 100 mg capsule Take 100 mg by mouth two (2) times a day.       ??? calcium carbonate (CALTREX) 600 mg (1,500 mg) tablet Take 600 mg by mouth two (2) times a day.       ??? cyanocobalamin (VITAMIN B12) 1,000 mcg/mL injection 1,000 mcg by IntraMUSCular route every thirty (30) days.       ??? levothyroxine (SYNTHROID) 50 mcg tablet Take  by mouth Daily (before breakfast).       ??? MULTIVITAMIN WITH MINERALS (ONE-A-DAY 50 PLUS PO) Take  by mouth.           No Known Allergies  History     Social History   ??? Marital Status: WIDOWED     Spouse Name: N/A     Number of Children: N/A   ??? Years of Education: N/A     Occupational History   ??? Not on file.     Social History Main Topics   ??? Smoking status: Never Smoker    ??? Smokeless tobacco: Not on file   ??? Alcohol Use: Yes      Comment: social   ??? Drug Use: No   ??? Sexually Active: No     Other Topics Concern   ??? Not on file     Social History Narrative   ??? No narrative on file     Family History   Problem Relation Age of Onset   ??? Alcohol abuse Father        BP 96/67   Temp(Src) 97.6 ??F (36.4 ??C) (Tympanic)   Resp 16   Ht 5' 7" (1.702 m)   Wt 142 lb (64.411 kg)   BMI 22.24 kg/m2    UA - Dipstick  Glucose - Negative  Bilirubin - small  Ketones - Negative  Specific Gravity - 1.030  Blood - Negative  PH - 5.5  Protein - 30 mg  Urobilinogen - 0.2 E.U./DL  Nitrite - Negative  Leukocyte Esterase - trace    UA - Micro  WBC - 0-3  RBC - 0  Bacteria - 0  Epith - 0    PE:There is a soft fleshy reddish growth emanating from the urethra.  It does not look like a typical caruncle.  There is no firmness to palpation.      Cystoscopy Procedure:    All risks, benefits and alternatives were again reviewed with patient and she is willing to proceed at this time.  Her genital area was prepped and draped and a sterile field applied.  2% lidocaine jelly was injected in the the urethra and allowed to dwell for several minutes.  A flexible cystoscope was then inserted into the urethral meatus and advanced under direct vision.  The urethra appeared normal with no obstructions. Except for the aforementioned lesion at the very distal end of the urethra. The bladder neck was open without scarring, contractures, frons or tumors present. The bladder was systematically surveyed.  No bladder trabeculations were seen.  No mucosal abnormalities were seen.  The ureteral orifices were seen in their normal orthotopic position.  The cystoscope was then removed under direct vision.  The patient tolerated the procedure well.    Assessment and Plan    ICD-9-CM    1. 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 Short-term memory loss 780.93    3. Congestive heart failure 428.0      Plan: I'm not sure if this is a caruncle Or a true urethral prolapse.  There is no evidence of necrosis of tissue and this is very soft and not suspicious for a cancer.  Since the patient has a number of medical issues and is asymptomatic I would simply like to reevaluate her and check this area again in 2 months.  I did discuss that this could be biopsied or even excised  But for now I just want to reevaluate her in 2 months.

## 2013-04-04 NOTE — Procedures (Signed)
[facility]  [address]  [place], [place] [address]  [phone]    Jo Simmons  DOB: October 06, 1927         HPI   79 y.o., female presents for cystoscopy theophylline patient was having some spotting from her vaginal area.  She saw Dr. Valentina Lucks who felt that this was benign prolapsed urethral tissue and she is sent here for evaluation.  The patient denies dysuria, gross hematuria, or recent spotting.The patient does have short-term memory loss and congestive heart failure both of which are stable.    Past Medical History   Diagnosis Date   ??? Anemia    ??? Chronic obstructive pulmonary disease    ??? GERD (gastroesophageal reflux disease)    ??? Depression    ??? Arthritis    ??? Thyroid disease      hypothyroid   ??? Congestive heart failure, unspecified    ??? Cardiomegaly    ??? HH (hiatus hernia)    ??? Psychotic disorder      memory loss- short term   ??? Depression    ??? Osteoarthritis    ??? Gastritis    ??? Heart abnormality      cardiomegaly    ??? Anemia, pernicious    ??? Hiatal hernia      Past Surgical History   Procedure Laterality Date   ??? Hx hip replacement       right side   ??? Hx endoscopy     ??? Hx urological     ??? Hx gi     ??? Pr cardiac surg procedure unlist     ??? Hx orthopaedic       right hip replacement   ??? Hx appendectomy     ??? Hx tonsillectomy     ??? Hx tonsillectomy     ??? Pr appendectomy     ??? Hx hip replacement  11/15/2012     Current Outpatient Prescriptions   Medication Sig Dispense Refill   ??? donepezil (ARICEPT) 5 mg tablet        ??? omeprazole (PRILOSEC) 20 mg capsule        ??? L-Methylfolate-B12-Acetylcyst (CEREFOLIN NAC) tablet Take 1 Tab by mouth daily.  90 Each  4   ??? budesonide-formoterol (SYMBICORT) 160-4.5 mcg/actuation HFA inhaler Take 2 Puffs by inhalation two (2) times a day.  1 Inhaler  1   ??? PARoxetine (PAXIL) 20 mg tablet Take 1 Tab by mouth daily.  90 Tab  3   ??? pantoprazole (PROTONIX) 40 mg tablet Take 1 Tab by mouth daily.  30 Tab  5   ??? ergocalciferol (ERGOCALCIFEROL) 50,000 unit capsule Take  50,000 Units by mouth every seven (7) days.       ??? estradiol (ESTRACE) 0.01 % (0.1 mg/gram) vaginal cream Insert 2 g into vagina. mon wed  fri       ??? docusate sodium (COLACE) 100 mg capsule Take 100 mg by mouth two (2) times a day.       ??? calcium carbonate (CALTREX) 600 mg (1,500 mg) tablet Take 600 mg by mouth two (2) times a day.       ??? cyanocobalamin (VITAMIN B12) 1,000 mcg/mL injection 1,000 mcg by IntraMUSCular route every thirty (30) days.       ??? levothyroxine (SYNTHROID) 50 mcg tablet Take  by mouth Daily (before breakfast).       ??? MULTIVITAMIN WITH MINERALS (ONE-A-DAY 50 PLUS PO) Take  by mouth.  No Known Allergies  History     Social History   ??? Marital Status: WIDOWED     Spouse Name: N/A     Number of Children: N/A   ??? Years of Education: N/A     Occupational History   ??? Not on file.     Social History Main Topics   ??? Smoking status: Never Smoker    ??? Smokeless tobacco: Not on file   ??? Alcohol Use: Yes      Comment: social   ??? Drug Use: No   ??? Sexually Active: No     Other Topics Concern   ??? Not on file     Social History Narrative   ??? No narrative on file     Family History   Problem Relation Age of Onset   ??? Alcohol abuse Father        BP 96/67   Temp(Src) 97.6 ??F (36.4 ??C) (Tympanic)   Resp 16   Ht 5\' 7"  (1.702 m)   Wt 142 lb (64.411 kg)   BMI 22.24 kg/m2    UA - Dipstick  Glucose - Negative  Bilirubin - small  Ketones - Negative  Specific Gravity - 1.030  Blood - Negative  PH - 5.5  Protein - 30 mg  Urobilinogen - 0.2 E.U./DL  Nitrite - Negative  Leukocyte Esterase - trace    UA - Micro  WBC - 0-3  RBC - 0  Bacteria - 0  Epith - 0    PE:There is a soft fleshy reddish growth emanating from the urethra.  It does not look like a typical caruncle.  There is no firmness to palpation.      Cystoscopy Procedure:    All risks, benefits and alternatives were again reviewed with patient and she is willing to proceed at this time.  Her genital area was prepped and draped and a sterile field applied.  2% lidocaine jelly was injected in the the urethra and allowed to dwell for several minutes.  A flexible cystoscope was then inserted into the urethral meatus and advanced under direct vision.  The urethra appeared normal with no obstructions. Except for the aforementioned lesion at the very distal end of the urethra. The bladder neck was open without scarring, contractures, frons or tumors present. The bladder was systematically surveyed.  No bladder trabeculations were seen.  No mucosal abnormalities were seen.  The ureteral orifices were seen in their normal orthotopic position.  The cystoscope was then removed under direct vision.  The patient tolerated the procedure well.    Assessment and Plan    ICD-9-CM    1. 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 Short-term memory loss 780.93    3. Congestive heart failure 428.0      Plan: I'm not sure if this is a caruncle Or a true urethral prolapse.  There is no evidence of necrosis of tissue and this is very soft and not suspicious for a cancer.  Since the patient has a number of medical issues and is asymptomatic I would simply like to reevaluate her and check this area again in 2 months.  I did discuss that this could be biopsied or even excised  But for now I just want to reevaluate her in 2 months.

## 2013-04-06 NOTE — Progress Notes (Addendum)
[facility]   [address], [place], [place] [address]  Phone:[phone]   [phone]  Outpatient PHYSICAL THERAPY: Daily Note and Discharge Visit#4  Fall Risk Score: 1 (? 5 = High Risk)       REFERRING PHYSICIAN: Overton Mam, MD  MD Orders: Hip abductor/ quad strengthening, gait training  Return Physician Appointment: 03/27/13  MEDICAL/REFERRING DIAGNOSIS: S/P total hip arthroplasty [V43.64]  Other physical therapy [V57.1]  DATE OF ONSET: 11/14/12   PRIOR LEVEL OF FUNCTION: Patient (pt) ambulating with straight cane for long distances, living alone in apartment with no stairs to enter.    PRECAUTIONS/ALLERGIES: No Known Allergies  ASSESSMENT:  ????????This section established at most recent assessment??????????  Pt  Is an 79 year old female presenting to therapy following fall with hip fracture and subsequent right hip hemiarthroplasty.  Now demonstrating decreased range of motion (ROM)/ strength, impaired gait quality and decreased independence with ADLS.  Pt would benefit from physical therapy to address these limitations and return to previous level of function with ADLS.  Pt may be limited by mild dementia.  Pt is at high risk of falls.  PROBLEM LIST (Impairments causing functional limitations):  1. Decreased Strength affecting function  2. Decreased ADL/Functional Activities  3. Decreased Flexibility/joint mobility  GOALS: (Goals have been discussed and agreed upon with patient.)  SHORT-TERM FUNCTIONAL GOALS: Time Frame: 2 weeks  1. Pt to be independent and compliant with home exercise program for independent management of symptoms.Marland Kitchen MET with Daughter Supervision  2. Pt will increase hip extension active ROM to 0-2 degrees in stance bilaterally for improved stride length with gait.  MET  3. Pt to increase score on Lower  Extremity Functional Scale (LEFS) to 36/80 for improved ease of mobility and ADLs.. MET  DISCHARGE GOALS: Time Frame:  6 weeks  1.  Pt will increase score on LEFS to 54/80 or better for increased mobility and independence with ADLs. MET 61/80  2. Pt will demonstrate 5/5 strength through all major LE muscle groups bilaterally.  MET 5/5 B)grossly  3. Pt to ambulate safely and independence with leasr restrictive assistive device in community. MET with RW  4. Pt to increased BERG Balance Test score to 45/56 for decreased risk of falls.  MET with 50/56  REHABILITATION POTENTIAL FOR STATED GOALS: GoodPLAN OF CARE:  INTERVENTIONS PLANNED: (Benefits and precautions of physical therapy have been discussed with the patient.)  1. balance exercise  2. gait training  3. home exercise program (HEP)  4. manual therapy  5. range of motion: active/assisted/passive  6. therapeutic activities  7. therapeutic exercise/strengthening  8. Aquatics  9. Modalities  TREATMENT PLAN EFFECTIVE DATES: 03/713 TO 05/24/13  FREQUENCY/DURATION: Follow patient 3 times a week for 6 weeks to address above goals.  Regarding [place] therapy, I certify that the treatment plan above will be carried out by a therapist or under their direction.  Thank you for this referral,  Agustina Caroli, PTA     Referring Physician Signature: Overton Mam, MD          Date                       SUBJECTIVE:    History of Present Injury/Illness (Reason for Referral): Pt was vacuuming and fell backwards, tipping vacuum and fractured R hip.  She was in rehab until 12/29/12 was doing and therapy at [facility] until 01/23/13.  Fell onto asphalt while at  flower [employer] . Had x-ray with no  New fracture.  Present Symptoms:  Pt had no  C/o pain , daughter stated she doing better but still struggles with balance when distracted. Stated Md requested D/C today at MD appt  To HEP.  Pain Intensity 1: 0  Pain Location 1: Hip  Dominant Side: right  Past Medical History: OA, falls with fracture, dizziness, tonsillectomy, appendectomy, mild dementia  Current Medications: Current outpatient  prescriptions:levothyroxine (SYNTHROID) 50 mcg tablet, Take 1 Tab by mouth Daily (before breakfast)., Disp: 90 Tab, Rfl: 3;  donepezil (ARICEPT) 5 mg tablet, , Disp: , Rfl: ;  omeprazole (PRILOSEC) 20 mg capsule, , Disp: , Rfl: ;  L-Methylfolate-B12-Acetylcyst (CEREFOLIN NAC) tablet, Take 1 Tab by mouth daily., Disp: 90 Each, Rfl: 4  budesonide-formoterol (SYMBICORT) 160-4.5 mcg/actuation HFA inhaler, Take 2 Puffs by inhalation two (2) times a day., Disp: 1 Inhaler, Rfl: 1;  PARoxetine (PAXIL) 20 mg tablet, Take 1 Tab by mouth daily., Disp: 90 Tab, Rfl: 3;  pantoprazole (PROTONIX) 40 mg tablet, Take 1 Tab by mouth daily., Disp: 30 Tab, Rfl: 5;  ergocalciferol (ERGOCALCIFEROL) 50,000 unit capsule, Take 50,000 Units by mouth every seven (7) days., Disp: , Rfl:   estradiol (ESTRACE) 0.01 % (0.1 mg/gram) vaginal cream, Insert 2 g into vagina. mon wed  fri, Disp: , Rfl: ;  docusate sodium (COLACE) 100 mg capsule, Take 100 mg by mouth two (2) times a day., Disp: , Rfl: ;  calcium carbonate (CALTREX) 600 mg (1,500 mg) tablet, Take 600 mg by mouth two (2) times a day., Disp: , Rfl: ;  cyanocobalamin (VITAMIN B12) 1,000 mcg/mL injection, 1,000 mcg by IntraMUSCular route every thirty (30) days., Disp: , Rfl:   MULTIVITAMIN WITH MINERALS (ONE-A-DAY 50 PLUS PO), Take  by mouth., Disp: , Rfl:      Date Last Reviewed: 04/07/2013    Social History/Home Situation: Pt lives alone in one story apartment with no stairs.  She has daughters living locally who provide supervision during her daily activities.      Work/Activity History: ambulating with cane  OBJECTIVE:  Outcome Measure:   Tool Used: Berg Balance Scale  Score:  Initial: 32/56 Most Recent: 50/56 (Date: 04/07/13 )   Interpretation of Score: Each section is scored on a 0-4 scale, 0 representing the patient???s inability to perform the task and 4 representing independence.  The scores of each section are added together for a total score of 56.  The higher the patient???s score, the  more independent the patient is.  Any score below 45 indicates increased risk for falls.    Score 56 55-45 44-34 33-23 22-12 11-1 0   Modifier CH CI CJ CK CL CM CN         Tool Used: Lower Extremity Functional Scale (LEFS)  Score:  Initial: 27/80 Most Recent: 61/80 (Date: 04/07/13 )   Interpretation of Score: 20 questions each scored on a 5 point scale with 0 representing "extreme difficulty or unable to perform" and 4 representing "no difficulty".  The lower the score, the greater the functional disability. 80/80 represents no disability.  Minimal detectable change is 9 points.  Score 80 79-63 62-48 47-32 31-16 15-1 0   Modifier CH CI CJ CK CL CM CN       Mobility - Walking and Moving Around:    Z6109 - CURRENT STATUS: CJ - 20%-39% impaired, limited or restricted   U0454 - GOAL STATUS:  CJ - 20%-39% impaired, limited or restricted  J1914 - D/C STATUS:  CI - 1%-19% impaired, limited or restricted    Observation/Orthostatic Postural Assessment:    flexed at hips, forward head, genu varum  Palpation:    no c/o tenderness  ROM: all others WFL except L hip extentsion 2 degrees, R hip extension 2 degrees                           Strength: grossly +4/5 or better            04/07/13  5/5 grossly B)LE      Functional Mobility:       Pt ambulating with rolling walker per MD instructions due to pt history of falls and lack of safety awareness.  :  TREATMENT:    (In addition to Assessment/Re-Assessment sessions the following treatments were rendered)    Neuromuscular Re-education: (  48 min):  Exercise/activities per grid below to improve balance, coordination and proprioception.  Required minimal verbal and manual cues to promote static and dynamic balance in standing.       03/31/13 04/02/13 04/04/13 04/07/13    BERG TEST    8 min    Neuromuscular Re-ed 40 45 50 40    Gait with head turns X 70ft X 40 ft  x 40 ft ______    Balance: NBOS with EO/EC   Air ex x4 10 sec Airex x4  air ex  x 4  x 4    NBOS with head turns Air ex x4 10  sec Solid x 4 10 sec  solid x 4 10 sec X 4 solid and airex    Partial Tandem EO/EC Solid x 4 10 sec Solid x 10sec x 4 Solid x 4 10 sec Solid and airex x4    Partial Tandem with Head turns Solid x4 10 sec Solid x4 10 sec  solid x 4 10 sec Solid a nd air ex x 4 10 sec    SLS- even _________  3 hold 15  multi trial x 10    SLS- air ex _________  3 hold 15 Multi trials x 10    BLE rockerboard balance ________   x 3 hold 20 sec _____    Dynamic standing with wt ball  UE/ LE x10  6# ball x 5 with forward lean, and lateral  soccer ball with forward lean and lateral X 5 same    dyna disc seated march X 10 X10 X 10 with rockerboard under feeet X 10 same    Dyna disc seated alt UE/LE x10 X 10 X 10 X 10    Dyna disc EO/EC X 3 10 sec ____ _____ X 10    Dyna disc head turns with feet on airex X 10 ea  x 10ea X 10 ea ----    March on airex x2 20 steps ea X 2 20 sec x2 30 sec -----    Wt shifts on Aireex Ant/Post  And lateral X 10 X 10 X10 -----    LAQ   4# x 15 4# x 15    Sit- stand X 15 X 15 X 15 no UE X 15 no UE    EO=eye open, EC= eye closed, NBOS= Narrow base of support.  HEP: As above; handouts given to patient for all exercises.  ______________________________________________________________________________________________________    Treatment Assessment:  Pt given BERG and LEF today with great improvement in function and balance. Pt D/C to HEP with  assistance form daughter at home for safety due to pt lack of safety awareness at times. Pt MET all goals.  Progression/Medical Necessity:   ?? Patient is expected to demonstrate progress in balance to improve safety during functional mobility..  Compliance with Program/Exercises: Will assess as treatment progresses.   Reason for Continuation of Services/Other Comments:  ?? Patient continues to demonstrate capacity to improve balance which will increase independence and increase safety.  Recommendations/Intent for next treatment session: "Treatment next visit will focus on  advancements to more challenging activities".    Total Treatment Duration: 48  PT Patient Time In/Time Out  Time In: 1530  Time Out: [address] [place]

## 2013-04-24 NOTE — Telephone Encounter (Signed)
Orders Placed This Encounter   ??? ergocalciferol (ERGOCALCIFEROL) 50,000 unit capsule     Sig: Take 1 Cap by mouth every seven (7) days.     Dispense:  12 Cap     Refill:  3     As per Dr. Allena Katz

## 2013-05-15 IMAGING — CT CT CHEST-ABD-PELV W/ CM
1 of 2 series · 13 of 29 positions shown, 18 images · non-contrast
Comparison: none

REASON FOR EXAM: neuroendocrine tumor stomach
COMMENTS:

PROCEDURE:     BANSAL CYPRUS - BANSAL CYPRUS CHEST ABDOMEN AND PELVIS W  - October 15, 2012  [DATE]
RESULT:
TECHNIQUE: CT of the chest, abdomen and pelvis is performed utilizing 70 ml
of Esovue-3CC iodinated intravenous contrast with images reconstructed in
the axial plane at 3.0 mm slice thickness.
Comparison is made to previous exam of the abdomen and pelvis performed on
04 May, 2010.

[Series 2: soft tissue · axial · 0.74mm/px · z∈[-761,-191]mm · 13 of 214 slices shown, 18 images]
[im 12/214  mediastinal]
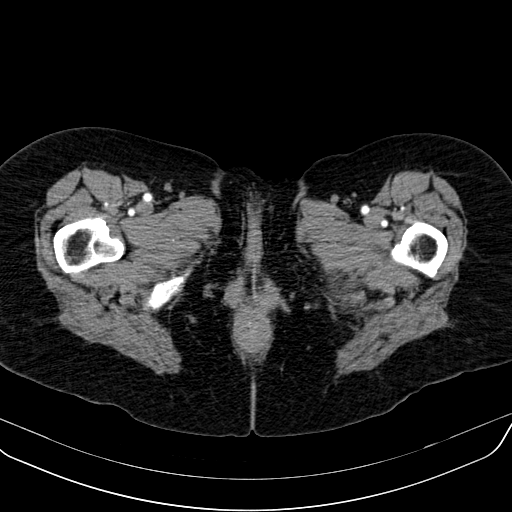
[im 12/214  bone]
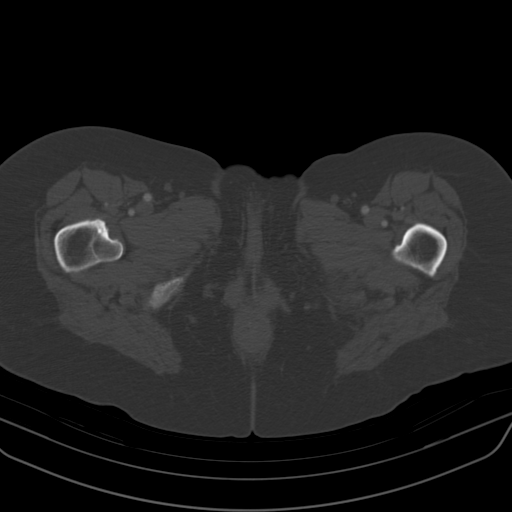
[im 36/214  mediastinal]
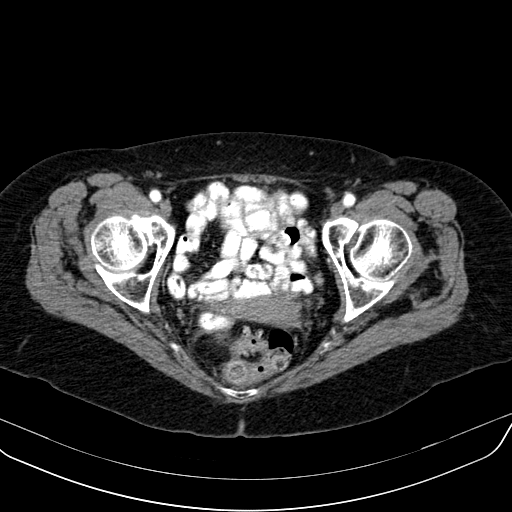
[im 48/214  mediastinal]
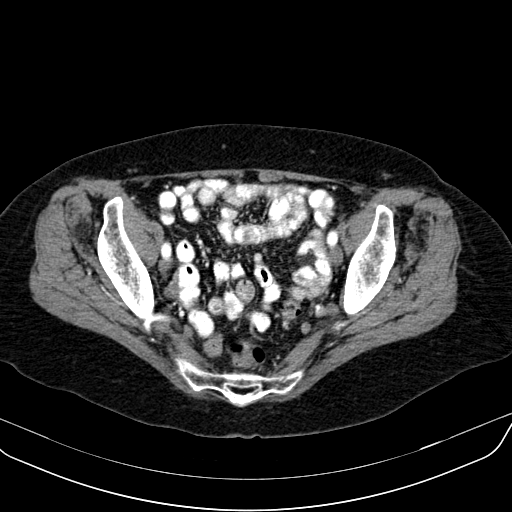
[im 72/214  mediastinal]
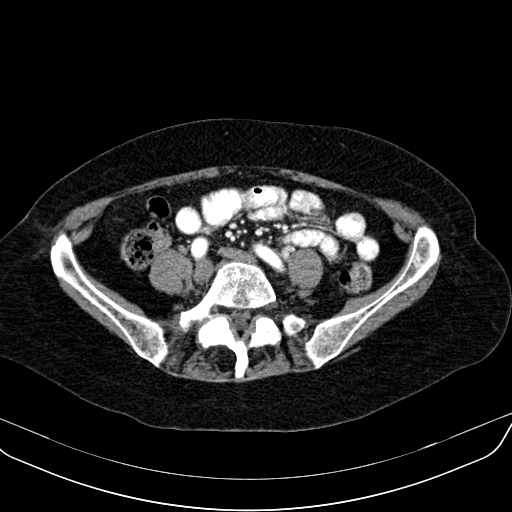
[im 83/214  mediastinal]
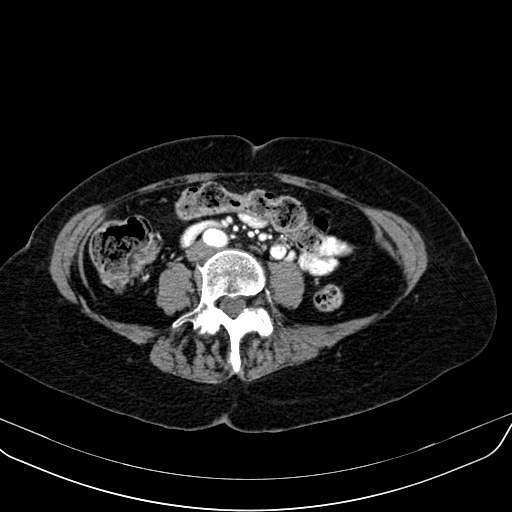
[im 105/214  mediastinal]
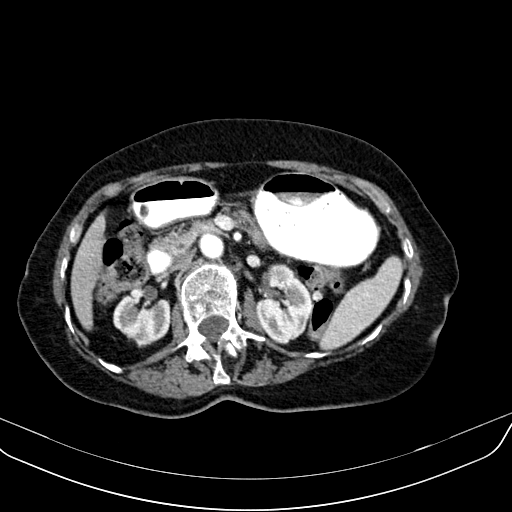
[im 107/214  mediastinal]
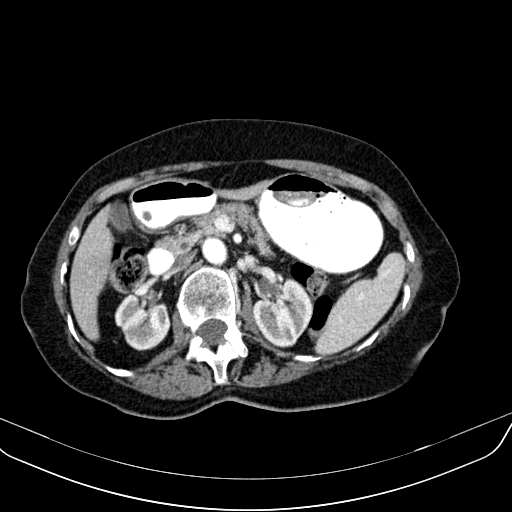
[im 131/214  mediastinal]
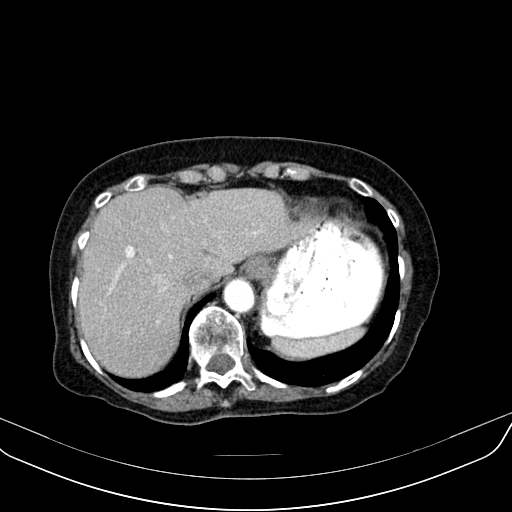
[im 143/214  mediastinal]
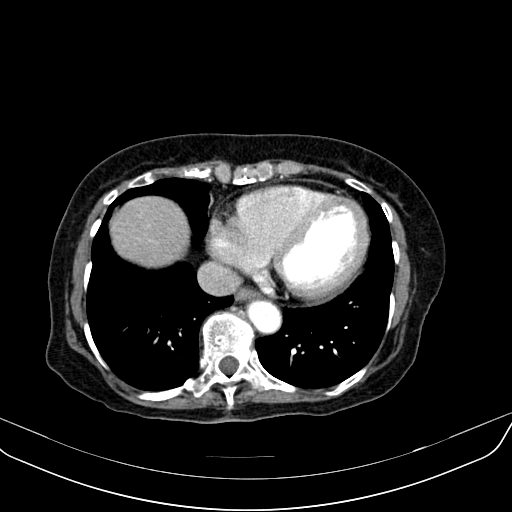
[im 143/214  bone]
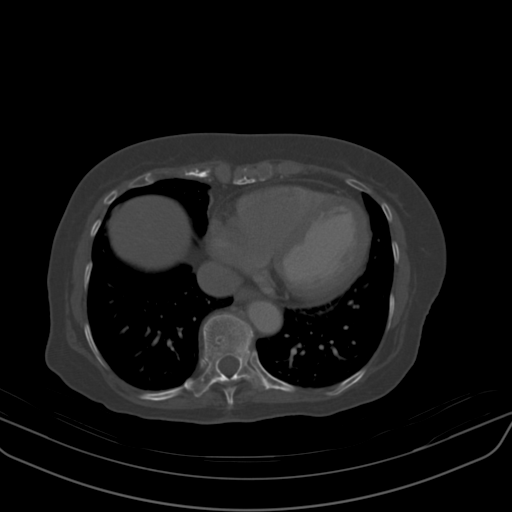
[im 166/214  mediastinal]
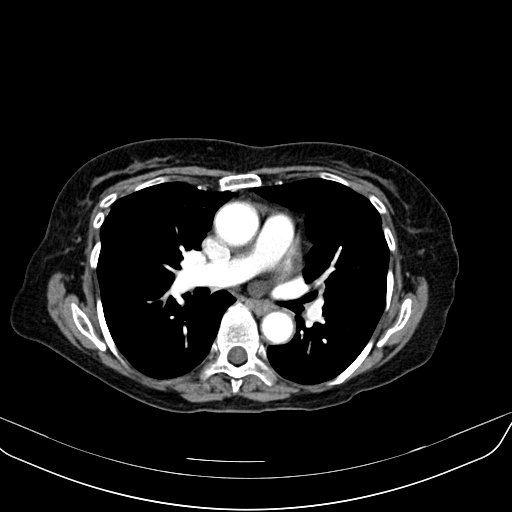
[im 166/214  lung]
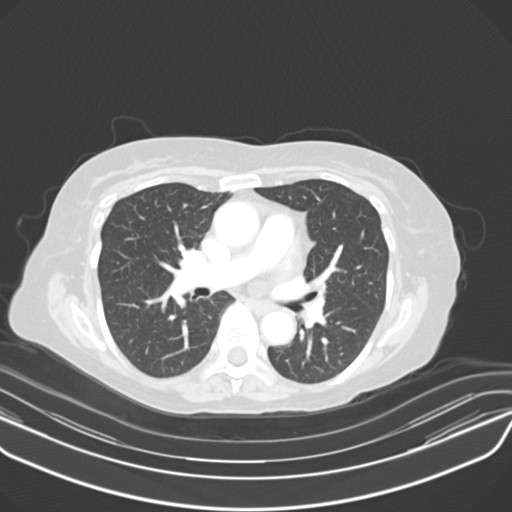
[im 178/214  mediastinal]
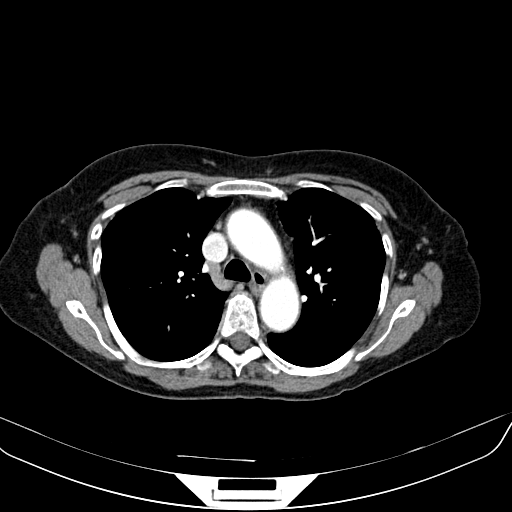
[im 178/214  lung]
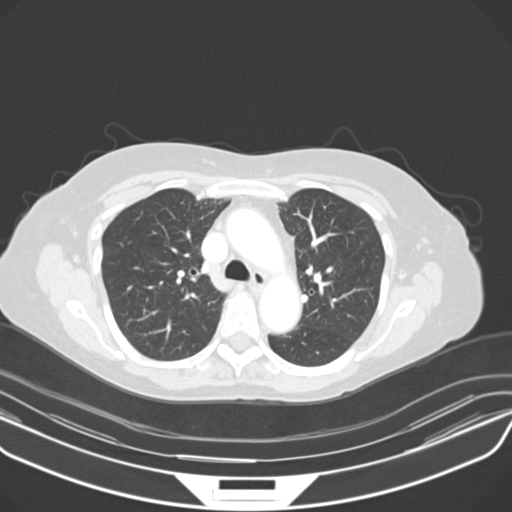
[im 190/214  lung]
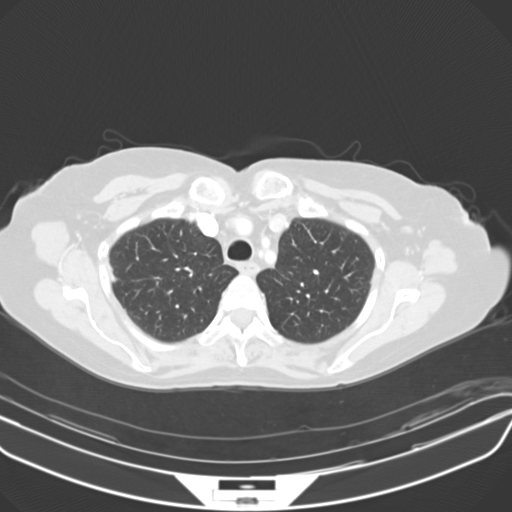
[im 202/214  mediastinal]
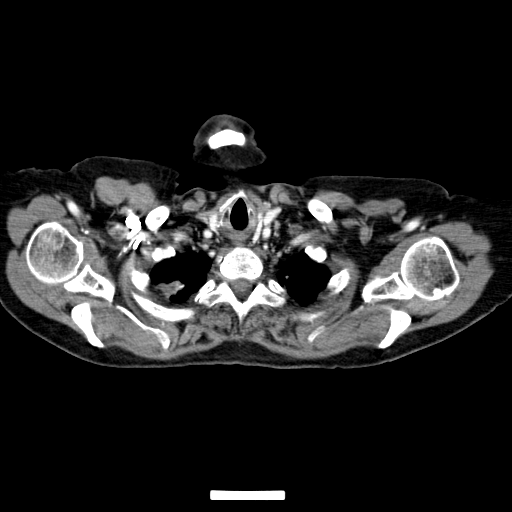
[im 202/214  lung]
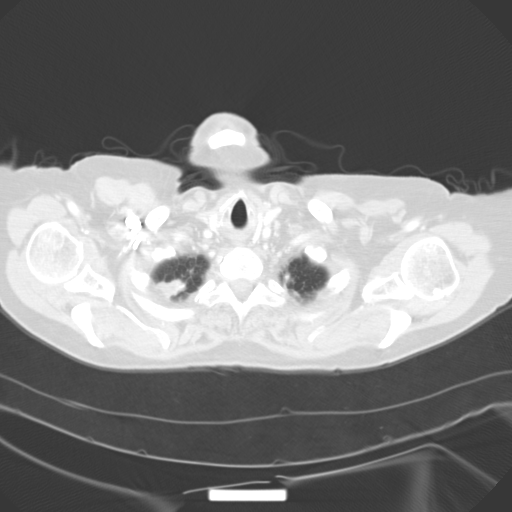

[13 of 29 positions shown; findings below may reference images not displayed]

FINDINGS: Lung window images demonstrate minimal subpleural nodularity in
the left lower lobe laterally on image 78 measuring up to 3.9 mm in length.
There is minimal nodularity along the major fissure on the left on image 47
measuring 5 mm. The lungs are otherwise clear. No endobronchial lesion is
evident. There is density at the right lung apex with calcification and soft
tissue density seen between images 12 and 18. The greatest area of soft
tissue density is seen on image 12 and measures approximately 1.8 x 0.8 cm.
This may be secondary to fibrosis. There is no supraclavicular, axillary,
mediastinal or hilar mass or adenopathy. No pleural or pericardial effusion
is evident

The stomach is predominantly filled with oral contrast and some ingested
material. A discrete gastric mass is not appreciated. There is no wall
thickening or evidence of obstruction. There is partial opacification of the
small bowel. The oral contrast has not reached the colon at the time of
imaging. The liver and spleen appear to be normal in size without a focal
mass. There is a probable gallbladder wall lesion suggestive of a polyp in
the anterior/superior portion the gallbladder. No definite cholelithiasis is
appreciated. The pancreas shows no discrete mass. The adrenal glands appear
unremarkable. The kidneys demonstrate some cortical thinning in the
posterior mid to upper right kidney. There is no retroperitoneal or
mesenteric mass or adenopathy. The uterus and adnexal structures appear
unremarkable. The urinary bladder is within normal limits. The bony
structures appear within normal limits.
IMPRESSION: 1.  Mild renal cortical thinning in the upper portion to mid portion of the
posterior aspect of the right kidney. This could be secondary to previous
infection. Infarct is felt to be less likely.
2.  Possible gallbladder polyp.
3.  Diffuse fatty infiltration noted in the liver.
4.  No definite gastric mass or wall thickening.
5.  Minimal nodularity along the major fissure in the left lung and
laterally in a subpleural location in the left lower lobe near the lung
base. Soft tissue and calcified density in the right lung apex posteriorly.
These areas are nonspecific and follow-up CT of the chest in six months is
recommended to document stability.

[REDACTED]

## 2013-05-20 NOTE — Telephone Encounter (Signed)
Orders Placed This Encounter   ??? ciprofloxacin (CIPRO) 250 mg tablet     Sig: Take 1 tablet by mouth two (2) times a day.     Dispense:  14 tablet     Refill:  0     Per dr

## 2013-05-27 ENCOUNTER — Encounter

## 2013-06-14 IMAGING — CR PELVIS - 1-2 VIEW
1 series · 1 of 1 positions shown · non-contrast
Comparison: none

REASON FOR EXAM: fall
COMMENTS:

PROCEDURE:     DXR - DXR PELVIS AP ONLY  - November 14, 2012 [DATE]
RESULT:     The bony pelvis is osteopenic. The patient has sustained an
acute subcapital fracture of the right hip. The right hemipelvis appears
intact as does the remainder the pelvis.

[t pelvis ap]
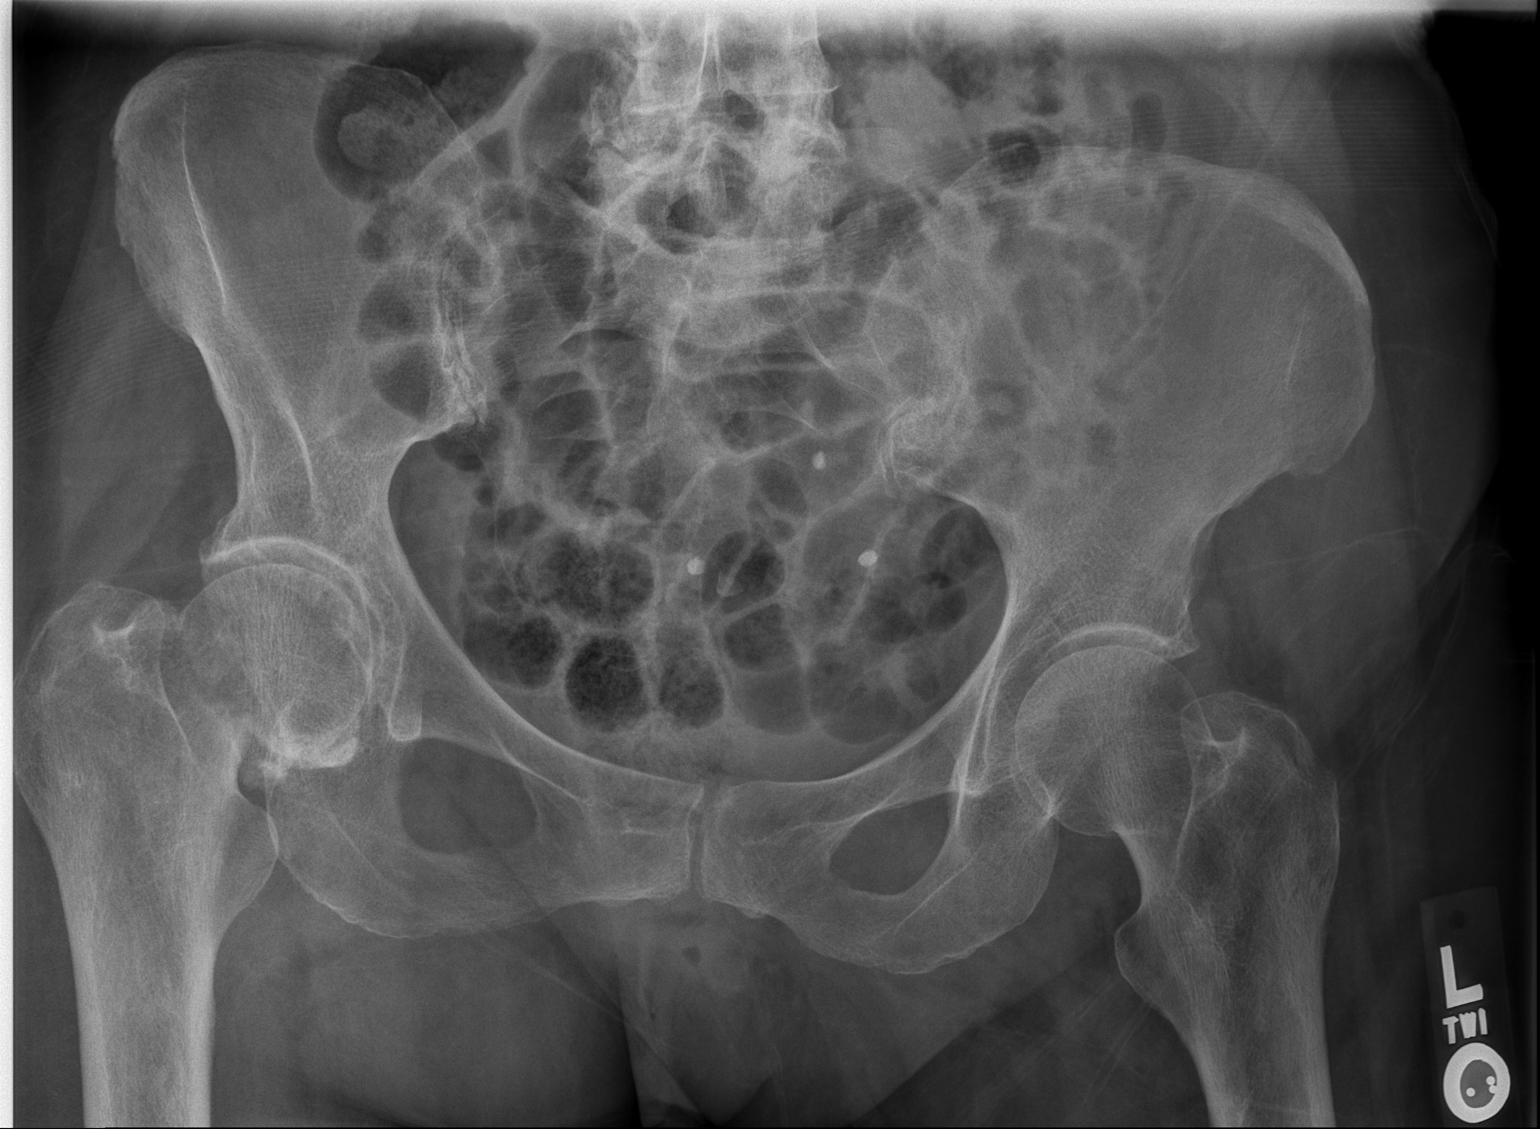

[1 of 1 positions shown; findings below may reference images not displayed]

IMPRESSION: There is no evidence of an acute pelvic fracture this
patient who has sustained an acute subcapital fracture of the right hip.

[REDACTED]

## 2013-06-15 IMAGING — CR DG CHEST 1V PORT
1 series · 2 of 2 positions shown · non-contrast
Comparison: none

REASON FOR EXAM: low oxygen sat
COMMENTS:

[Series 1: ap · 0.17mm/px · 2 of 2 slices shown]
[im 1/2]
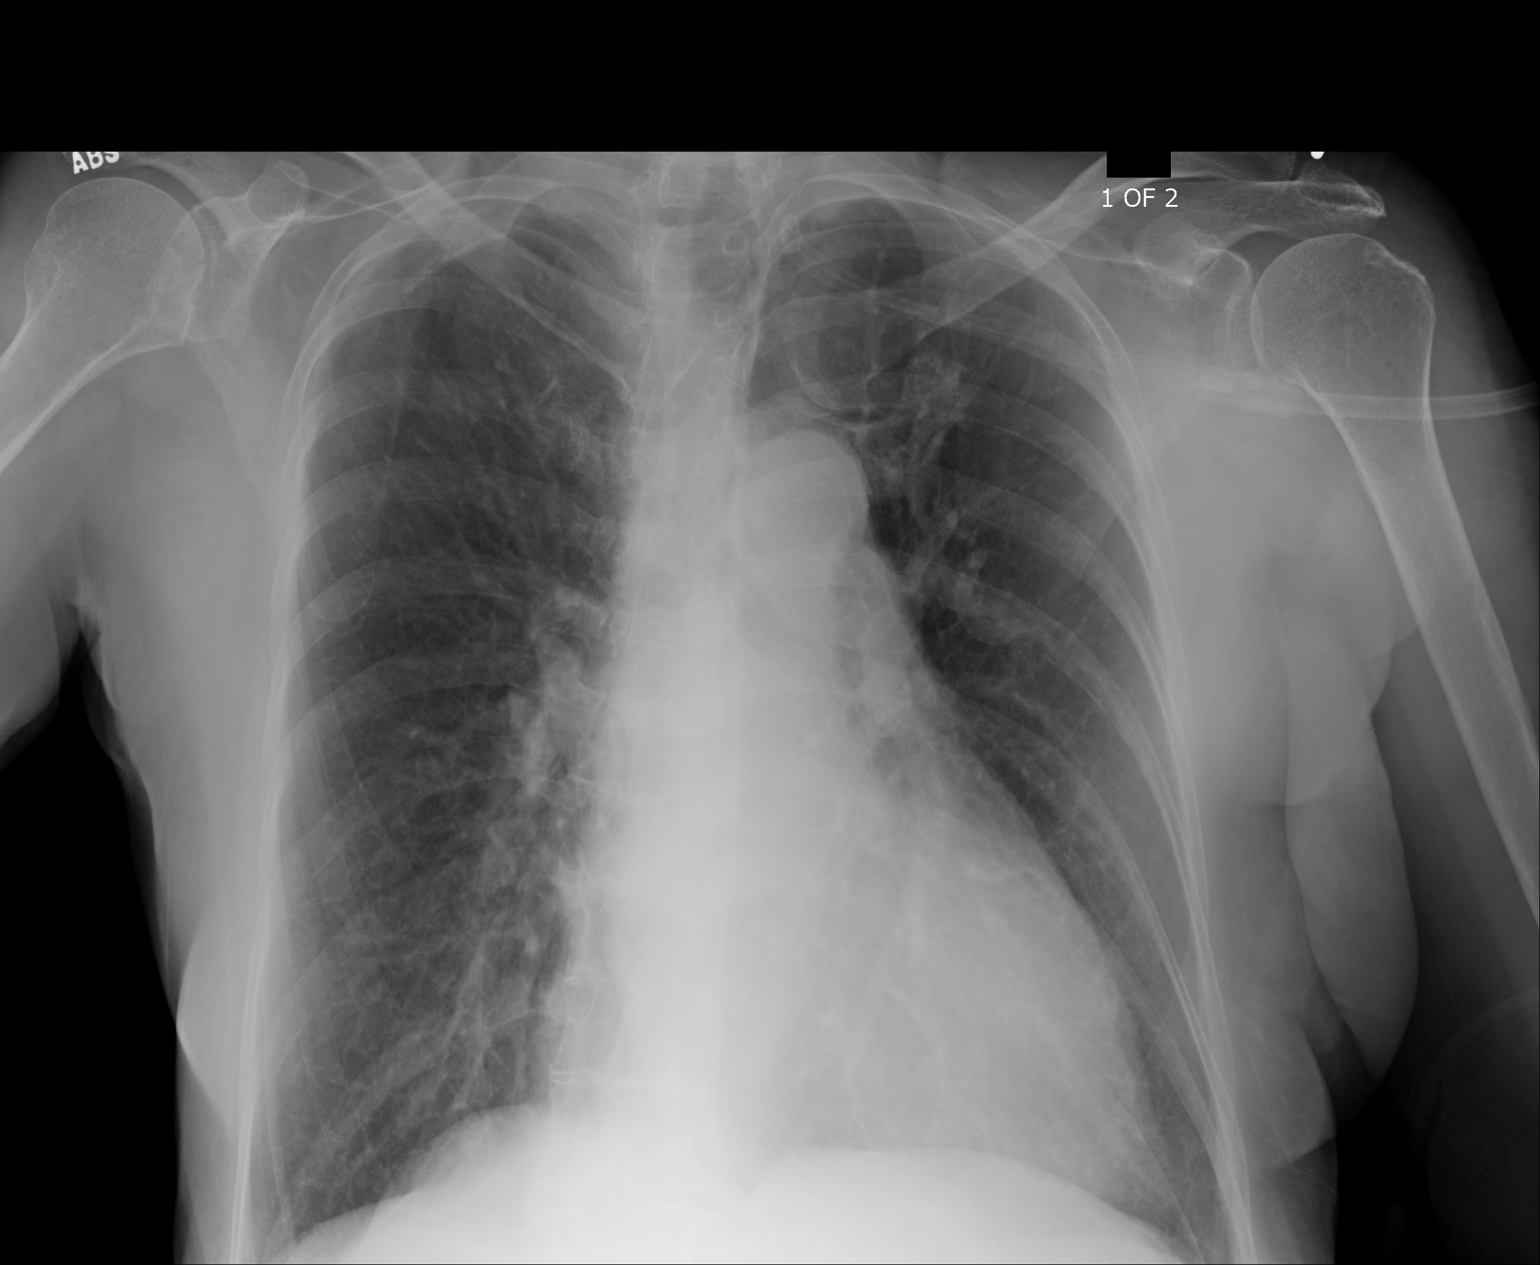
[im 2/2]
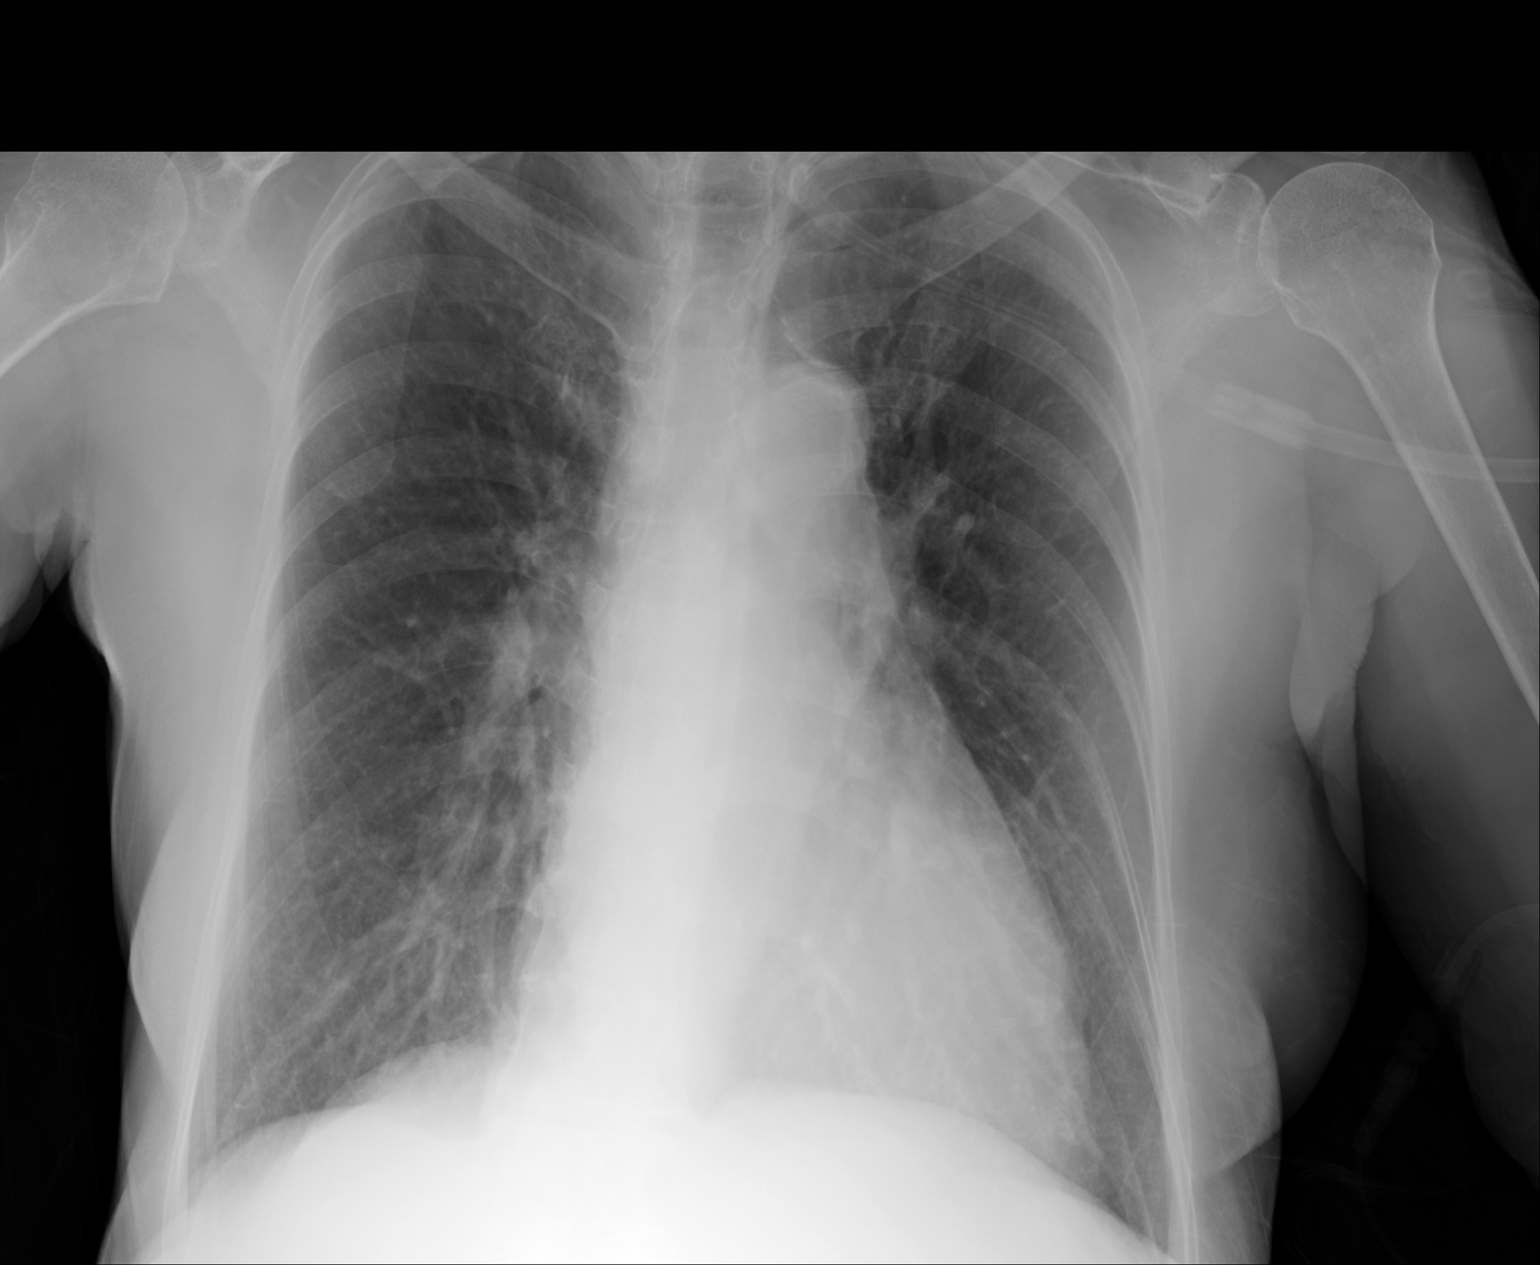

[2 of 2 positions shown; findings below may reference images not displayed]

PROCEDURE:     DXR - DXR PORTABLE CHEST SINGLE VIEW  - November 15, 2012 [DATE]

RESULT:     Comparison is made to the study 14 November, 2012.

The cardiac silhouette is enlarged. The lungs are hyperinflated but appear
to be clear. There is no effusion or pneumothorax. The bony structures
appear intact.
IMPRESSION: Cardiomegaly and COPD. No acute abnormality evident.

[REDACTED]

## 2013-06-15 IMAGING — CR PELVIS - 1-2 VIEW
1 series · 1 of 1 positions shown · non-contrast
Comparison: none

REASON FOR EXAM: post op in PACU
COMMENTS:   Bedside (portable):Y

PROCEDURE:     DXR - DXR PELVIS AP ONLY  - November 15, 2012  [DATE]
RESULT:     The patient has undergone prosthetic right hip placement.
Radiographic positioning of the prosthetic components is good. A surgical
drainage catheter is in place. The native left hip exhibits no acute
abnormality.

[ap]
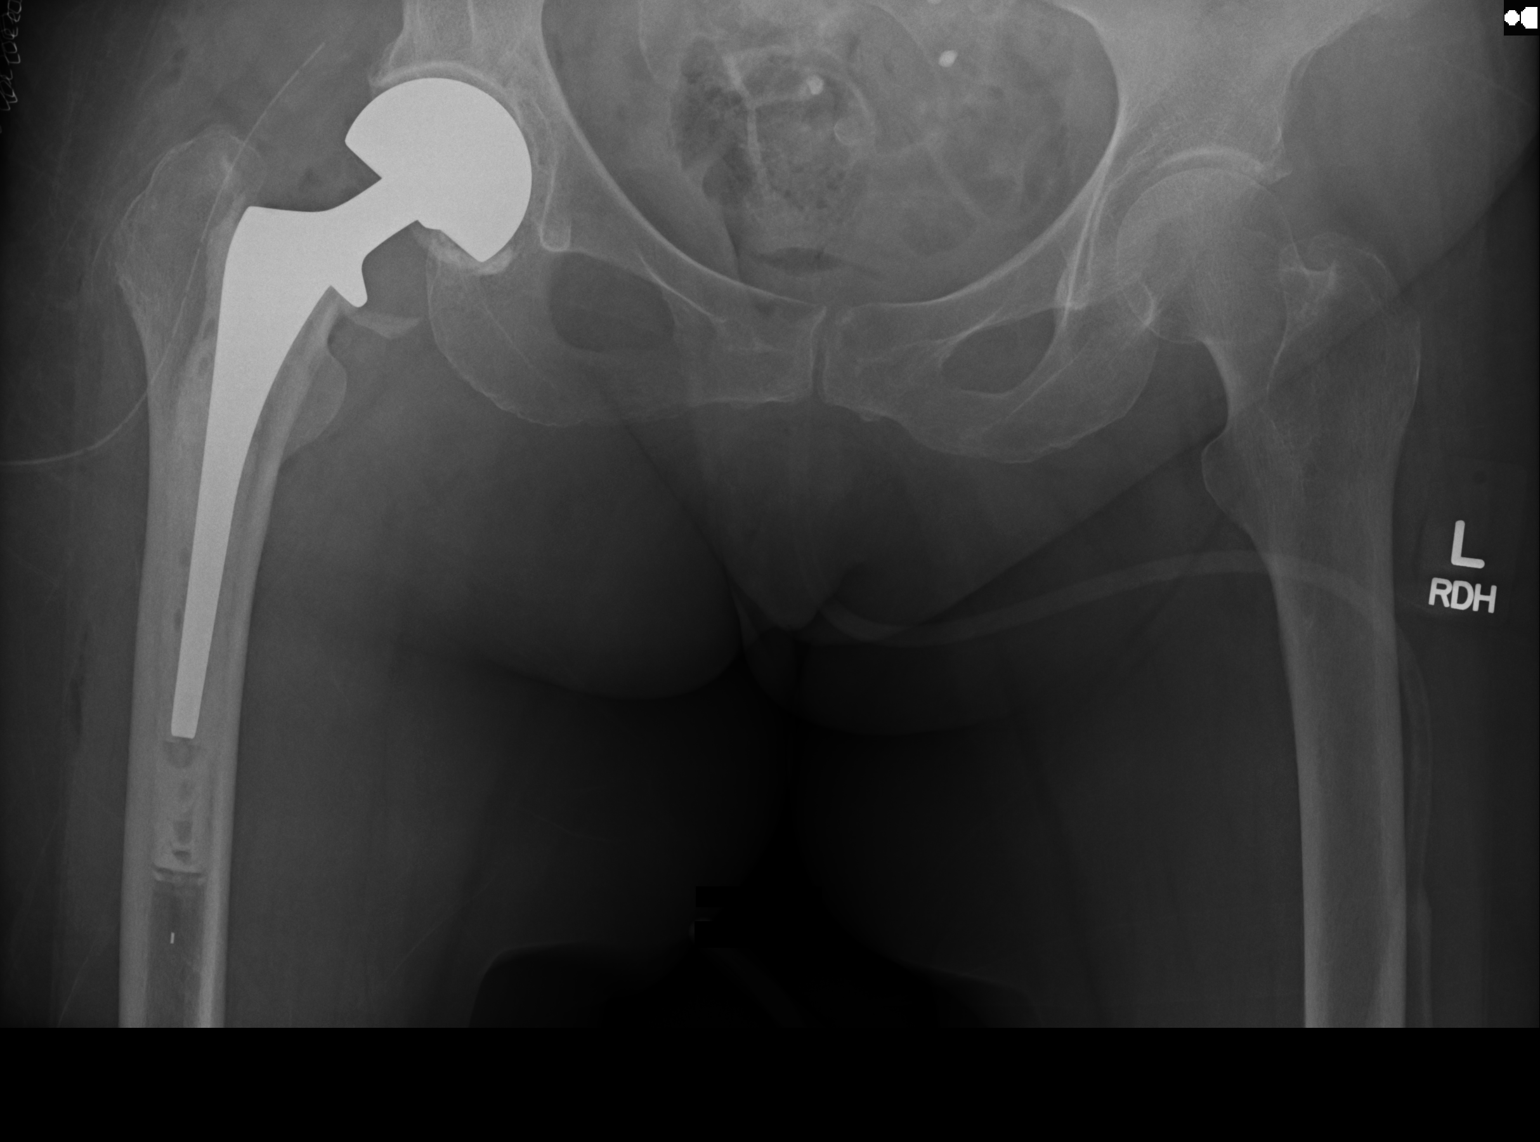

[1 of 1 positions shown; findings below may reference images not displayed]

IMPRESSION: The patient has undergone placement of a right hip
prosthesis. Further interpretation is deferred to Dr. Neus.

[REDACTED]

## 2013-06-16 LAB — AMB POC URINALYSIS DIP STICK AUTO W/ MICRO

## 2013-06-16 NOTE — Progress Notes (Signed)
[facility]  [address]  [place], [place] [address]  [phone]    Jo Simmons  DOB: August 27, 1928    Chief Complaint   Patient presents with   ??? Follow-up     ? urethral caruncle          HPI     Jo Simmons is a 79 y.o. female In for a 2 month recheck of Questionable urethral prolapse.  The patient denies any pain in this region, hematuria, or spotting.  Dr. Valentina Simmons had seen the patient and felt that this was a prolapsed urethra.  I examined the patient approximately 2 months ago and this area was soft nontender with no induration.  The patient's COPD is stable.The patient's hypothyroidism is stable.          Past Medical History   Diagnosis Date   ??? Anemia    ??? Chronic obstructive pulmonary disease    ??? GERD (gastroesophageal reflux disease)    ??? Depression    ??? Arthritis    ??? Thyroid disease      hypothyroid   ??? Congestive heart failure, unspecified    ??? Cardiomegaly    ??? HH (hiatus hernia)    ??? Psychotic disorder      memory loss- short term   ??? Depression    ??? Osteoarthritis    ??? Gastritis    ??? Heart abnormality      cardiomegaly    ??? Anemia, pernicious    ??? Hiatal hernia      Past Surgical History   Procedure Laterality Date   ??? Hx hip replacement       right side   ??? Hx endoscopy     ??? Hx urological     ??? Hx gi     ??? Pr cardiac surg procedure unlist     ??? Hx orthopaedic       right hip replacement   ??? Hx appendectomy     ??? Hx tonsillectomy     ??? Hx tonsillectomy     ??? Pr appendectomy     ??? Hx hip replacement  11/15/2012     Current Outpatient Prescriptions   Medication Sig Dispense Refill   ??? ciprofloxacin (CIPRO) 250 mg tablet Take 1 tablet by mouth two (2) times a day.  14 tablet  0   ??? ergocalciferol (ERGOCALCIFEROL) 50,000 unit capsule Take 1 Cap by mouth every seven (7) days.  12 Cap  3   ??? levothyroxine (SYNTHROID) 50 mcg tablet Take 1 Tab by mouth Daily (before breakfast).  90 Tab  3   ??? donepezil (ARICEPT) 5 mg tablet        ??? omeprazole (PRILOSEC) 20 mg capsule        ???  L-Methylfolate-B12-Acetylcyst (CEREFOLIN NAC) tablet Take 1 Tab by mouth daily.  90 Each  4   ??? budesonide-formoterol (SYMBICORT) 160-4.5 mcg/actuation HFA inhaler Take 2 Puffs by inhalation two (2) times a day.  1 Inhaler  1   ??? PARoxetine (PAXIL) 20 mg tablet Take 1 Tab by mouth daily.  90 Tab  3   ??? pantoprazole (PROTONIX) 40 mg tablet Take 1 Tab by mouth daily.  30 Tab  5   ??? estradiol (ESTRACE) 0.01 % (0.1 mg/gram) vaginal cream Insert 2 g into vagina. mon wed  fri       ??? docusate sodium (COLACE) 100 mg capsule Take 100 mg by mouth two (2) times a day.       ???  calcium carbonate (CALTREX) 600 mg (1,500 mg) tablet Take 600 mg by mouth two (2) times a day.       ??? cyanocobalamin (VITAMIN B12) 1,000 mcg/mL injection 1,000 mcg by IntraMUSCular route every thirty (30) days.       ??? MULTIVITAMIN WITH MINERALS (ONE-A-DAY 50 PLUS PO) Take  by mouth.         No Known Allergies  History     Social History   ??? Marital Status: WIDOWED     Spouse Name: N/A     Number of Children: N/A   ??? Years of Education: N/A     Occupational History   ??? Not on file.     Social History Main Topics   ??? Smoking status: Never Smoker    ??? Smokeless tobacco: Not on file   ??? Alcohol Use: Yes      Comment: social   ??? Drug Use: No   ??? Sexually Active: No     Other Topics Concern   ??? Not on file     Social History Narrative   ??? No narrative on file     Family History   Problem Relation Age of Onset   ??? Alcohol abuse Father        Review of Systems  GI: Positive for indigestion and heartburn.  Genitourinary: Positive for hematuria.    ROS: See HPI  General appearance-alert, well appearing, and in no distress  Mental status-alert, oriented to person, place and time  Eyes-pupils equal and reactive, extraocular movements intact  Nose-normal and patent, no discharge or polyps  Mouth-mucous membranes moist  Neck-supple, no significant adenopathy  Chest-normal chest wall excursion and inspection  Heart-no edema or JVD  Abdomen-nondistended, without  obvious masses  GU-Stable fleshy appearance of the distal urethra consistent with probable prolapse versus caruncle, there is no induration or anything to suggest this being a malignant process and it is not changed in size.    Back-full range of motion, nontender  Neuro-cranial nerves grossly intact  Musculoskeletal-without deformity or swelling  Extremities-no clubbing,  Cyanosis, or pedal edema  Skin-normal color, turgor, no rashes        Urinalysis  UA - Dipstick  Glucose - Negative  Bilirubin - small  Ketones - trace  Specific Gravity - 1.030  Blood - moderate  PH - 6.0  Protein - 100 mg  Urobilinogen - 1.0E.U./DL  Nitrite - Negative  Leukocyte Esterase - large    UA - Micro  WBC -5-10  RBC - 0-3  Bacteria - 0  Epith - 0      Assessment and Plan    ICD-9-CM    1. 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 COPD (chronic obstructive pulmonary disease) 496    3. Hypothyroidism 244.9        Follow-up Disposition:  Return in about 6 months (around 12/14/2013).      Plan: I would like to recheck the patient in 6 months.

## 2013-06-30 NOTE — Progress Notes (Signed)
Are you allergic to eggs? NO    Have you ever had a serious reaction to any vaccine in the past? NO    Allergies on file:  No Known Allergies

## 2013-06-30 NOTE — Progress Notes (Signed)
[facility]  Suriyah Vergara C. Allena Katz, M.D.  Deloris Ping, M.D.  [address]  [place], [place] [address]  Ph No:  [phone]  Fax:  [phone]      CHIEF COMPLAINT: follow-up visit         HISTORY OF PRESENT ILLNESS: Jo Simmons is a 79 y.o. female with past medical history positive for COPD, GERD, depression, hypothyroidism, CHF, heart, or hernia.  She is in office today stating that she is here for checkup.  She denies any headache, dizziness, no cough, congestion, no sweating, abdominal pain, no blood in the stool or black-colored stool, according to her daughter.  She is taking her medication regularly.  Her depression is better controlled.  She is also walking better.  He is walking cane and walker for ambulation.  She still has some occasional chronic hip pain, but overall she is doing very well.  She also would like to get her flu vaccine today.    Still have some issues with memory loss but has been stable.    HISTORY:  No Known Allergies  Past Medical History   Diagnosis Date   ??? Anemia    ??? Chronic obstructive pulmonary disease    ??? GERD (gastroesophageal reflux disease)    ??? Depression    ??? Arthritis    ??? Thyroid disease      hypothyroid   ??? Congestive heart failure, unspecified    ??? Cardiomegaly    ??? HH (hiatus hernia)    ??? Psychotic disorder      memory loss- short term   ??? Depression    ??? Osteoarthritis    ??? Gastritis    ??? Heart abnormality      cardiomegaly    ??? Anemia, pernicious    ??? Hiatal hernia      Past Surgical History   Procedure Laterality Date   ??? Hx hip replacement       right side   ??? Hx endoscopy     ??? Hx urological     ??? Hx gi     ??? Pr cardiac surg procedure unlist     ??? Hx orthopaedic       right hip replacement   ??? Hx appendectomy     ??? Hx tonsillectomy     ??? Hx tonsillectomy     ??? Pr appendectomy     ??? Hx hip replacement  11/15/2012     Family History   Problem Relation Age of Onset   ??? Alcohol abuse Father      History     Social History    ??? Marital Status: WIDOWED     Spouse Name: N/A     Number of Children: N/A   ??? Years of Education: N/A     Occupational History   ??? Not on file.     Social History Main Topics   ??? Smoking status: Never Smoker    ??? Smokeless tobacco: Not on file   ??? Alcohol Use: Yes      Comment: social   ??? Drug Use: No   ??? Sexually Active: No     Other Topics Concern   ??? Not on file     Social History Narrative   ??? No narrative on file     Current Outpatient Prescriptions   Medication Sig Dispense Refill   ??? cyanocobalamin (VITAMIN B12) 1,000 mcg/mL injection 1 mL by IntraMUSCular route every thirty (30)  days.  1 vial  3   ??? levothyroxine (SYNTHROID) 50 mcg tablet Take 1 Tab by mouth Daily (before breakfast).  90 Tab  3   ??? omeprazole (PRILOSEC) 20 mg capsule        ??? budesonide-formoterol (SYMBICORT) 160-4.5 mcg/actuation HFA inhaler Take 2 Puffs by inhalation two (2) times a day.  1 Inhaler  1   ??? PARoxetine (PAXIL) 20 mg tablet Take 1 Tab by mouth daily.  90 Tab  3   ??? pantoprazole (PROTONIX) 40 mg tablet Take 1 Tab by mouth daily.  30 Tab  5   ??? estradiol (ESTRACE) 0.01 % (0.1 mg/gram) vaginal cream Insert 2 g into vagina. mon wed  fri       ??? docusate sodium (COLACE) 100 mg capsule Take 100 mg by mouth two (2) times a day.       ??? calcium carbonate (CALTREX) 600 mg (1,500 mg) tablet Take 600 mg by mouth two (2) times a day.       ??? MULTIVITAMIN WITH MINERALS (ONE-A-DAY 50 PLUS PO) Take  by mouth.       ??? ciprofloxacin (CIPRO) 250 mg tablet Take 1 tablet by mouth two (2) times a day.  14 tablet  0   ??? L-Methylfolate-B12-Acetylcyst (CEREFOLIN NAC) tablet Take 1 Tab by mouth daily.  90 Each  4       MEDICATIONS:  Current outpatient prescriptions:cyanocobalamin (VITAMIN B12) 1,000 mcg/mL injection, 1 mL by IntraMUSCular route every thirty (30) days., Disp: 1 vial, Rfl: 3;  levothyroxine (SYNTHROID) 50 mcg tablet, Take 1 Tab by mouth Daily (before breakfast)., Disp: 90 Tab, Rfl: 3;  omeprazole (PRILOSEC) 20 mg capsule, , Disp: ,  Rfl:   budesonide-formoterol (SYMBICORT) 160-4.5 mcg/actuation HFA inhaler, Take 2 Puffs by inhalation two (2) times a day., Disp: 1 Inhaler, Rfl: 1;  PARoxetine (PAXIL) 20 mg tablet, Take 1 Tab by mouth daily., Disp: 90 Tab, Rfl: 3;  pantoprazole (PROTONIX) 40 mg tablet, Take 1 Tab by mouth daily., Disp: 30 Tab, Rfl: 5;  estradiol (ESTRACE) 0.01 % (0.1 mg/gram) vaginal cream, Insert 2 g into vagina. mon wed  fri, Disp: , Rfl:   docusate sodium (COLACE) 100 mg capsule, Take 100 mg by mouth two (2) times a day., Disp: , Rfl: ;  calcium carbonate (CALTREX) 600 mg (1,500 mg) tablet, Take 600 mg by mouth two (2) times a day., Disp: , Rfl: ;  MULTIVITAMIN WITH MINERALS (ONE-A-DAY 50 PLUS PO), Take  by mouth., Disp: , Rfl: ;  ciprofloxacin (CIPRO) 250 mg tablet, Take 1 tablet by mouth two (2) times a day., Disp: 14 tablet, Rfl: 0  L-Methylfolate-B12-Acetylcyst (CEREFOLIN NAC) tablet, Take 1 Tab by mouth daily., Disp: 90 Each, Rfl: 4      REVIEW OF SYSTEMS:  GENERAL/CONSTITUTIONAL: negative  HEAD, EYES, EARS, NOSE AND THROAT: negative  CARDIOVASCULAR: negative  RESPIRATORY:  negative   GASTROINTESTINAL: negative  GENITOURINARY: negative  MUSCULOSKELETAL: positive for joint pain  BREASTS: negative  SKIN:  negative  NEUROLOGIC: positive for memory loss  PSYCHIATRIC: negative  ENDOCRINE:  negative  HEMATOLOGIC/LYMPHATIC:  negative  ALLERGIC/IMMUNOLOGIC:  negative        PHYSICAL EXAM  Vital Signs - BP 130/72   Pulse 84   Ht 5\' 7"  (1.702 m)   Wt 149 lb (67.586 kg)   BMI 23.33 kg/m2   Constitutional - alert, well appearing, and in no distress.  Eyes - pupils equal and reactive, extraocular eye movements intact.  Ear, Nose, Mouth,  Throat - normal  Examination of oropharynx: oral mucosa moist  Neck - supple, no significant adenopathy.   Respiratory - clear to auscultation, no wheezes, rales or bronchi, symmetric air entry.  Cardiovascular - normal rate, regular rhythm, normal S1, S2, systolic murmur     Gastrointestinal - Abdomen  soft, nontender, nondistended, no masses.  Back exam - range of motion is limited  Musculoskeletal -  DJD changes evident  Skin - normal coloration and turgor, no rashes, no suspicious skin lesions noted.  Neurological - Cranial nerves with notation of any deficits. Motor and sensory exam is intact   Extremities - peripheral pulses normal, no pedal edema, no clubbing or cyanosis  Psychiatric - alert, oriented to person, place, and time. Anxious female    LABS  Results for orders placed in visit on 06/16/13   AMB POC URINALYSIS DIP STICK AUTO W/ MICRO       Result Value Range    Color (UA POC)        Clarity (UA POC)        Glucose (UA POC)    Negative    Bilirubin (UA POC)    Negative    Ketones (UA POC)    Negative    Specific gravity (UA POC)    1.001 - 1.035    Blood (UA POC)    Negative    pH (UA POC)    4.6 - 8.0    Protein (UA POC)    Negative mg/dL    Urobilinogen (UA POC)    0.2 - 1    Nitrites (UA POC)    Negative    Leukocyte esterase (UA POC)    Negative             IMPRESSION:    ICD-9-CM    1. Hypothyroidism 244.9 AMB POC COMPLETE CBC,AUTOMATED ENTER     TSH, 3RD GENERATION     T4   2. B12 deficiency 266.2    3. Depression 311    4. COPD (chronic obstructive pulmonary disease) 496 AMB POC URINALYSIS DIP STICK AUTO W/O MICRO (CIM)     COLLECTION VENOUS BLOOD,VENIPUNCTURE   5. Esophageal reflux 530.81    6. Need for influenza vaccination V04.81 ADMIN INFLUENZA VIRUS VAC     INFLUENZA VIRUS VACCINE, FLUVIRIN VACC, 3 YRS & >, IM, MEDICARE ONLY   7. Neuro-endocrine cancer 209.30 REFERRAL TO GASTROENTEROLOGY   8. HLD (hyperlipidemia) 272.4 LIPID PANEL     METABOLIC PANEL, COMPREHENSIVE     TSH, 3RD GENERATION     T4          PLAN:  Plan discussed with patient given flu vaccine to her today.  She is advised to continue current medication.  Given B12 injections once a month.  Refill continue her reflux measure to continue her inhalers.  Antidepressant therapy will be continued.  Her memory loss has been stable.   Follow-up is recommended in a few months.  See orders    She will also have blood work completed before the next visit.    Jo Kern, MD          Dictated using voice recognition software. Proofread, but unrecognized voice recognition errors may exist.

## 2013-07-01 LAB — AMB POC URINALYSIS DIP STICK AUTO W/O MICRO
Bilirubin (UA POC): NEGATIVE
Glucose (UA POC): NEGATIVE mg/dL
Ketones (UA POC): NEGATIVE
Nitrites (UA POC): POSITIVE
Protein (UA POC): 30
Specific gravity (UA POC): 1.025 (ref 1.001–1.035)
Urobilinogen (UA POC): 0.2 (ref 0.2–1)
pH (UA POC): 6 (ref 4.6–8.0)

## 2013-07-01 LAB — AMB POC COMPLETE CBC,AUTOMATED ENTER
ABS. GRANS (POC): 2.5 10*3/uL (ref 1.4–6.5)
ABS. LYMPHS (POC): 0.9 10*3/uL — AB (ref 1.2–3.4)
ABS. MONOS (POC): 0.3 10*3/uL (ref 0.1–0.6)
GRANULOCYTES (POC): 67.1 % (ref 42.2–75.2)
HCT (POC): 42.2 % (ref 35–60)
HGB (POC): 13.7 g/dL (ref 11–18)
LYMPHOCYTES (POC): 23.7 % (ref 20.5–51.1)
MCH (POC): 26.1 pg — AB (ref 27–31)
MCHC (POC): 32.4 g/dL — AB (ref 33–37)
MCV (POC): 80.7 fL (ref 80–99.9)
MONOCYTES (POC): 9.2 % (ref 1.7–9.3)
MPV (POC): 6.2 fL — AB (ref 7.8–11)
PLATELET (POC): 246 10*3/uL (ref 150–450)
RBC (POC): 5.23 10*6/uL (ref 4–6)
RDW (POC): 14.3 % — AB (ref 11.6–13.7)
WBC (POC): 3.7 10*3/uL — AB (ref 4.5–10.5)

## 2013-07-01 NOTE — Addendum Note (Signed)
Addended by: Lowella Fairy on: 07/01/2013 10:39 AM     Modules accepted: Orders

## 2013-07-02 LAB — METABOLIC PANEL, COMPREHENSIVE
A-G Ratio: 2.6 — ABNORMAL HIGH (ref 1.1–2.5)
ALT (SGPT): 18 IU/L (ref 0–32)
AST (SGOT): 23 IU/L (ref 0–40)
Albumin: 4.2 g/dL (ref 3.5–4.7)
Alk. phosphatase: 127 IU/L — ABNORMAL HIGH (ref 39–117)
BUN/Creatinine ratio: 10 — ABNORMAL LOW (ref 11–26)
BUN: 9 mg/dL (ref 8–27)
Bilirubin, total: 0.8 mg/dL (ref 0.0–1.2)
CO2: 25 mmol/L (ref 18–29)
Calcium: 9.4 mg/dL (ref 8.6–10.2)
Chloride: 103 mmol/L (ref 97–108)
Creatinine: 0.87 mg/dL (ref 0.57–1.00)
GFR est AA: 71 mL/min/{1.73_m2} (ref >59)
GFR est non-AA: 62 mL/min/{1.73_m2} (ref >59)
GLOBULIN, TOTAL: 1.6 g/dL (ref 1.5–4.5)
Glucose: 89 mg/dL (ref 65–99)
Potassium: 4.3 mmol/L (ref 3.5–5.2)
Protein, total: 5.8 g/dL — ABNORMAL LOW (ref 6.0–8.5)
Sodium: 144 mmol/L (ref 134–144)

## 2013-07-02 LAB — T4 (THYROXINE): T4, Total: 10 ug/dL (ref 4.5–12.0)

## 2013-07-02 LAB — LIPID PANEL
Cholesterol, total: 231 mg/dL — ABNORMAL HIGH (ref 100–199)
HDL Cholesterol: 68 mg/dL (ref >39)
LDL, calculated: 139 mg/dL — ABNORMAL HIGH (ref 0–99)
Triglyceride: 118 mg/dL (ref 0–149)
VLDL, calculated: 24 mg/dL (ref 5–40)

## 2013-07-02 LAB — TSH 3RD GENERATION: TSH: 2.8 u[IU]/mL (ref 0.450–4.500)

## 2013-07-03 LAB — CULTURE, URINE

## 2013-07-04 NOTE — Telephone Encounter (Signed)
Orders Placed This Encounter   ??? ciprofloxacin (CIPRO) 250 mg tablet     Sig: Take 1 tablet by mouth two (2) times a day.     Dispense:  10 tablet     Refill:  0     Per dr

## 2013-07-28 NOTE — Progress Notes (Signed)
[facility]  Jo Mascio C. Jo Simmons, M.D.  Jo Simmons, M.D.  [address]  [place], [place] [address]  Ph No:  [phone]  Fax:  [phone]      CHIEF COMPLAINT: follow-up visit       History of Present Illness: Jo Simmons is a 79 y.o. female that presents today with PMH of   Dementia, depression, anxiety, hypothyroidism, history of COPD she is seen in office today accompanied by her daughter stating that she is stable.  She is feeling fine.  She is taking her medication.  She denies any fever, chills, denied any nausea, no vomiting.  She had a urinary tract infection.  She completed her course of antibiotic.  She is also getting every month.  B12 injection by her daughter and that has helped her considerably in the past she had tried Aricept but that did not work well with her.  She does not take any of therapy    No Known Allergies  Past Medical History   Diagnosis Date   ??? Anemia    ??? Chronic obstructive pulmonary disease    ??? GERD (gastroesophageal reflux disease)    ??? Depression    ??? Arthritis    ??? Thyroid disease      hypothyroid   ??? Cardiomegaly    ??? HH (hiatus hernia)    ??? Psychotic disorder      memory loss- short term   ??? Depression    ??? Osteoarthritis    ??? Gastritis    ??? Heart abnormality      cardiomegaly    ??? Anemia, pernicious    ??? Hiatal hernia      Past Surgical History   Procedure Laterality Date   ??? Hx hip replacement       right side   ??? Hx endoscopy     ??? Hx urological     ??? Hx gi     ??? Pr cardiac surg procedure unlist     ??? Hx orthopaedic       right hip replacement   ??? Hx appendectomy     ??? Hx tonsillectomy     ??? Hx tonsillectomy     ??? Pr appendectomy     ??? Hx hip replacement  11/15/2012     Family History   Problem Relation Age of Onset   ??? Alcohol abuse Father      History     Social History   ??? Marital Status: WIDOWED     Spouse Name: N/A     Number of Children: N/A   ??? Years of Education: N/A     Occupational History   ??? Not on file.      Social History Main Topics   ??? Smoking status: Never Smoker    ??? Smokeless tobacco: Not on file   ??? Alcohol Use: Yes      Comment: social   ??? Drug Use: No   ??? Sexually Active: No     Other Topics Concern   ??? Not on file     Social History Narrative   ??? No narrative on file     Current Outpatient Prescriptions   Medication Sig Dispense Refill   ??? ergocalciferol (ERGOCALCIFEROL) 50,000 unit capsule        ??? coconut oil, bulk, oil by Does Not Apply route.       ??? cyanocobalamin (VITAMIN B12) 1,000 mcg/mL injection 1 mL by IntraMUSCular  route every thirty (30) days.  1 vial  3   ??? levothyroxine (SYNTHROID) 50 mcg tablet Take 1 Tab by mouth Daily (before breakfast).  90 Tab  3   ??? L-Methylfolate-B12-Acetylcyst (CEREFOLIN NAC) tablet Take 1 Tab by mouth daily.  90 Each  4   ??? budesonide-formoterol (SYMBICORT) 160-4.5 mcg/actuation HFA inhaler Take 2 Puffs by inhalation two (2) times a day.  1 Inhaler  1   ??? PARoxetine (PAXIL) 20 mg tablet Take 1 Tab by mouth daily.  90 Tab  3   ??? pantoprazole (PROTONIX) 40 mg tablet Take 1 Tab by mouth daily.  30 Tab  5   ??? estradiol (ESTRACE) 0.01 % (0.1 mg/gram) vaginal cream Insert 2 g into vagina. mon wed  fri       ??? docusate sodium (COLACE) 100 mg capsule Take 100 mg by mouth two (2) times a day.       ??? calcium carbonate (CALTREX) 600 mg (1,500 mg) tablet Take 600 mg by mouth two (2) times a day.       ??? MULTIVITAMIN WITH MINERALS (ONE-A-DAY 50 PLUS PO) Take  by mouth.             REVIEW OF SYSTEMS:    GENERAL/CONSTITUTIONAL: Negative for  - chills, fatigue, fever, night sweats, sleep disturbance, weight gain, weight loss  HEAD, EYES, EARS, NOSE AND THROAT: Negative for - epistaxis, headaches, hearing change, nasal congestion, nasal discharge, oral lesions, sinus pain, sneezing, sore throat, tinnitus, vertigo, visual changes, vocal changes  CARDIOVASCULAR: Negative for - chest pain, edema, irregular heartbeat, loss of consciousness, orthopnea, palpitations, paroxysmal nocturnal  dyspnea, rapid heart rate, shortness of breath on exertion.  RESPIRATORY:  Negative for - cough, hemoptysis, orthopnea, pleuritic pain, shortness of breath, sputum changes, stridor, tachypnea, wheezing  GASTROINTESTINAL: Negative for - abdominal pain, appetite loss, blood in stools, change in bowel habits, change in stools, constipation, diarrhea, gas/bloating, heartburn, hematemesis, melena, nausea/vomiting, stool incontinence, swallowing difficulty/pain  GENITOURINARY: Negative for - Urinary frequency  MUSCULOSKELETAL: positive history for DJD  SKIN:  Negative for - acne, dry skin, eczema, hair changes, lumps, mole changes, nail changes, pruritus, rash, skin lesion changes  NEUROLOGIC: Negative for - behavioral changes, bowel and bladder control changes, confusion, dizziness, gait disturbance, headaches, impaired coordination/balance, memory loss, numbness/tingling, seizures, speech problems, tremors, visual changes, weakness  PSYCHIATRIC: Negative for - anxiety, behavioral disorder, concentration difficulties, depression, disorientation, hallucinations, irritability, memory difficulties, mood swings, obsessive thoughts, physical abuse, sexual abuse, sleep disturbances, suicidal ideation  ENDOCRINE:  Negative for - breast changes, galactorrhea, hair pattern changes, hot flashes, malaise/lethargy, mood swings, palpitations, polydipsia/polyuria, skin changes, temperature intolerance, unexpected weight changes  HEMATOLOGIC/LYMPHATIC:  Negative for - bleeding problems, blood clots, blood transfusions, bruising, fatigue, jaundice, night sweats, pallor, swollen lymph nodes, weight loss   ALLERGIC/IMMUNOLOGIC:  Negative for - hives, insect bite sensitivity, itchy/watery eyes, nasal congestion, postnasal drip, seasonal allergies        PHYSICAL EXAM   Vital Signs - BP 118/76   Pulse 95   Ht 5\' 7"  (1.702 m)   Wt 149 lb (67.586 kg)   BMI 23.33 kg/m2   Constitutional - alert, well appearing, and in no distress.  Eyes -  pupils equal and reactive, extraocular eye movements intact.  Ear, Nose, Mouth, Throat - external inspection of ears and nose normal   Neck - supple, no significant adenopathy.   Respiratory - clear to auscultation, no wheezes, rales or bronchi, symmetric air entry.  Cardiovascular - normal  rate, regular rhythm, normal S1, S2, systolic murmurs, rubs, clicks or gallops,  Gastrointestinal - Abdomen soft, nontender, nondistended, no masses.  Back exam - full range of motion, no tenderness, palpable spasm or pain on motion  Lymphatic - Palpitation of lymph nodes in two or more areas: neck, axillae, groin.  Musculoskeletal -  DJD changes evident.  Skin - normal coloration and turgor, no rashes, no suspicious skin lesions noted.  Neurological - Cranial nerves with notation of any deficits. Motor and sensory exam is intact .   Extremities - peripheral pulses normal, no pedal edema, no clubbing or cyanosis  Psychiatric - alert, oriented to person, place, and time, recent and remote memory, mood and affect       LABS:     Results for orders placed in visit on 07/01/13   CULTURE, URINE       Result Value Range    Urine Culture, Routine   (*)     Value: Escherichia coli      Greater than 100,000 colony forming units per mL       IMPRESSION:     ICD-9-CM   1. Hypothyroidism 244.9   2. B12 deficiency 266.2   3. Depression 311   4. COPD (chronic obstructive pulmonary disease) 496   5. Dementia 294.20        PLAN: I discussed with patient advise her to continue current medication reviewed the urine culture report.  Reviewed other lab work.  She completed antibiotic.  She is feeling better.  She had not had more confusion..  Continue B12 therapy follow-up was recommended.  To 4 months.                 Jo Kern, MD    Dictated using voice recognition software. Proofread, but unrecognized voice recognition errors may exist.

## 2013-08-07 NOTE — Telephone Encounter (Signed)
Orders Placed This Encounter   ??? DISCONTD: varicella zoster vacine live (ZOSTAVAX) 19,400 unit/0.65 mL susr injection     Sig: 0.65 mL by SubCUTAneous route once.   ??? varicella zoster vacine live (ZOSTAVAX) 19,400 unit/0.65 mL susr injection     Sig: 1 vial by SubCUTAneous route once for 1 dose.     Dispense:  0.65 mL     Refill:  0     Per dr

## 2013-08-25 NOTE — Telephone Encounter (Signed)
Orders Placed This Encounter   ??? pantoprazole (PROTONIX) 40 mg tablet     Sig: Take 1 tablet by mouth daily.     Dispense:  30 tablet     Refill:  5     Per dr

## 2013-09-29 NOTE — Telephone Encounter (Signed)
Orders Placed This Encounter   ??? ciprofloxacin HCl (CIPRO) 250 mg tablet     Sig: Take 1 tablet by mouth two (2) times a day.     Dispense:  14 tablet     Refill:  0     Per dr

## 2013-10-07 ENCOUNTER — Telehealth

## 2013-10-07 NOTE — Telephone Encounter (Signed)
Pts daughter calling and states they use estrace for her mothers prolapsed bladder. She would like another refill called in to pharmacy. [facility] in [place].Thank you.

## 2013-10-07 NOTE — Telephone Encounter (Signed)
I Erxd Estrace vaginal cream

## 2013-10-07 NOTE — Telephone Encounter (Signed)
Is it okay to refill this medication      Jo Simmons

## 2013-10-07 NOTE — Telephone Encounter (Signed)
Spoke with daughter and advised     rslevy

## 2013-10-13 NOTE — Telephone Encounter (Signed)
Faxed from back,cv

## 2013-10-14 NOTE — Other (Signed)
Received from [facility]:  07/28/13 office note-placed on chart

## 2013-10-14 NOTE — Telephone Encounter (Signed)
I left message letting her know per Dr Britt Boozer simply sleep again and check with the endoscopy center as far as what to take wed night b/c of the prep,cv

## 2013-10-14 NOTE — Other (Signed)
Phone assessment done with daughter, Malachy Mood who is POA.  Pt has dementia.  The following pre procedure instructions given:  Shower three nights prior to procedure and  morning of with Dial or antibacterial soap, no lotion, powder, perfume/cologne, makeup, jewelry morning of procedure.  .  Wear clean, comfortable clothing.    Npo past MN except for the least amount of water to take with the following medications: colace, synthroid, claritin, paxil, zantac.    Stop the following meds: vitamin e,  Multivitamin, vitamin d    For colonoscopy, take colon prep and stay on clear liquids the day before as prescribed by doctor; no red or blue dye.    Report to the GI lab on 2nd floor of main hospital.  Asked daughter to bring POA paperwork DOS.

## 2013-10-14 NOTE — Other (Signed)
Dr Ena Dawley office called for EKG (d/t hx cardiomegaly) -have none but will send most recent office visit.

## 2013-10-16 ENCOUNTER — Inpatient Hospital Stay: Payer: MEDICARE

## 2013-10-16 LAB — PH, FLUID: FLUID PH: 8

## 2013-10-16 MED ORDER — SODIUM CHLORIDE 0.9 % IJ SYRG
Freq: Three times a day (TID) | INTRAMUSCULAR | Status: DC
Start: 2013-10-16 — End: 2013-10-16

## 2013-10-16 MED ORDER — LACTATED RINGERS IV
INTRAVENOUS | Status: DC
Start: 2013-10-16 — End: 2013-10-16
  Administered 2013-10-16: 14:00:00 via INTRAVENOUS

## 2013-10-16 MED ORDER — SODIUM CHLORIDE 0.9 % IJ SYRG
INTRAMUSCULAR | Status: DC | PRN
Start: 2013-10-16 — End: 2013-10-16

## 2013-10-16 MED ADMIN — lidocaine (PF) (XYLOCAINE) 20 mg/mL (2 %) injection: INTRAVENOUS | @ 15:00:00 | NDC 00409428202

## 2013-10-16 MED ADMIN — propofol (DIPRIVAN) 10 mg/mL injection: INTRAVENOUS | @ 15:00:00 | NDC 63323027025

## 2013-10-16 MED ADMIN — propofol (DIPRIVAN) 10 mg/mL injection: INTRAVENOUS | @ 15:00:00 | NDC 54569434000

## 2013-10-16 MED FILL — LIDOCAINE (PF) 20 MG/ML (2 %) IV SYRINGE: 100 mg/5 mL (2 %) | INTRAVENOUS | Qty: 5

## 2013-10-16 MED FILL — PROPOFOL 10 MG/ML IV EMUL: 10 mg/mL | INTRAVENOUS | Qty: 196.88

## 2013-10-16 NOTE — Progress Notes (Signed)
Dr. Mare Ferrari and Dr. Loree Fee have both seen patient are have discharged patient to home.

## 2013-10-16 NOTE — Progress Notes (Signed)
Pt. Discharged to car by Starr with daughter . Vital signs stable. Able to tolerate PO fluids. Passing gas. Seen by MD.

## 2013-10-16 NOTE — Anesthesia Pre-Procedure Evaluation (Signed)
Anesthetic History   No history of anesthetic complications           Review of Systems / Medical History  Patient summary reviewed and pertinent labs reviewed    Pulmonary              Comments: COPD history but asymptomatic, does not take symbicort   Neuro/Psych         Dementia     Cardiovascular                Exercise tolerance: <4 METS     GI/Hepatic/Renal                  Endo/Other      Hypothyroidism       Other Findings            Physical Exam    Airway  Mallampati: I  TM Distance: 4 - 6 cm  Neck ROM: normal range of motion   Mouth opening: Normal     Cardiovascular    Rhythm: regular  Rate: normal         Dental  No notable dental hx       Pulmonary  Breath sounds clear to auscultation               Abdominal         Other Findings            Anesthetic Plan    ASA: 2  Anesthesia type: total IV anesthesia            Anesthetic plan and risks discussed with: Patient and Son / Daughter      Daughter is [employer]

## 2013-10-16 NOTE — Procedures (Signed)
[name] [name]                                [phone]                                 PROCEDURE NOTE    NAME:  Jo, Simmons                               MR:  [id number]  LOC:  ENDOENDOPL            SEX:  F                 ACCT:  [id number]  DOB:  Jul 03, 1928            AGE:  80                PT:  S  ADMIT:  10/16/2013          DSCH:                   MSV:        DATE OF PROCEDURE: 10/16/2013    INDICATIONS:  1. Gastric polyps.  2. Regurgitation.    POSTOPERATIVE DIAGNOSES:  1. Small hiatal hernia.  2. Diminutive gastric polyps.  3. Otherwise unremarkable appearing stomach. Biopsies obtained.    MEDICATIONS: Anesthesia.    PROCEDURE: Esophagogastroduodenoscopy with biopsy.    INSTRUMENT: [id number].    FINDINGS: After informed consent was obtained, the patient was sedated  and the gastroscope was inserted in the mouth and advanced into the  distal duodenum without difficulty. The scope was slowly withdrawn while  the mucosa was carefully examined, including a retroflex view of the  cardia and fundus.  1. ESOPHAGUS: There is a small gastric inlet patch in the proximal  esophagus. There was no esophagitis or Barrett's mucosa. There is a  normal-appearing Z-line. There is no stricture or obstruction.  2. STOMACH: There was a small hiatal hernia in the proximal stomach, 2-3  cm. There are 3 diminutive polyps in the gastric body and fundus, 3-5 mm,  along the greater curve and posterior wall. Polypectomy performed with a  cold forceps. The remainder of the gastric mucosa appeared normal without  ulceration or inflammation. Biopsies were obtained to rule out underlying  H pylori infection. A gastric aspirate was collected for gastric pH  testing. The folds did not appear overtly atrophic. The pylorus was  normal.  3. DUODENUM: The duodenal mucosa appeared normal including the bulb,   sweep, and descending portions.    RECOMMENDATIONS:  1. Follow up pathology results.  2. Continue Zantac and avoid PPI therapy if possible.  3. Follow up in the office with Dr. Imelda Pillow for further recommendations.  Based on the patient's age and comorbidities, consider repeating  endoscopy on an as needed basis.  Silvio Clayman, MD                This is an unverified document unless signed by physician.    TID:  [id number]              DIC ID:  [id number]         DT:  10/16/2013 11:22 A  JOB:  [id number]           DOC#:  [id number]           DD:  10/16/2013    cc:   Silvio Clayman, MD        Zenovia Jarred, MD

## 2013-10-16 NOTE — Procedures (Signed)
EGD: HH, diminutive gastric polyps  Rec: Check path    SBrackbill, MD

## 2013-10-16 NOTE — Anesthesia Post-Procedure Evaluation (Signed)
Post-Anesthesia Evaluation and Assessment    Patient: Jo Simmons MRN: [id number]  SSN: [id number]    Date of Birth: 07/09/1928  Age: 80 y.o.  Sex: female       Cardiovascular Function/Vital Signs  Visit Vitals   Item Reading   ??? BP 184/91   ??? Pulse 63   ??? Temp 36 ??C (96.8 ??F)   ??? Resp 18   ??? Ht 5\' 8"  (1.727 m)   ??? Wt 67.132 kg (148 lb)   ??? BMI 22.51 kg/m2   ??? SpO2 94%       Patient is status post total IV anesthesia anesthesia for Procedure(s):  ESOPHAGOGASTRODUODENOSCOPY (EGD)  BMI 22  ESOPHAGOGASTRODUODENAL (EGD) BIOPSY.    Nausea/Vomiting: None    Postoperative hydration reviewed and adequate.    Pain:  Pain Scale 1: Numeric (0 - 10) (10/16/13 1133)  Pain Intensity 1: 0 (10/16/13 1133)   Managed    Neurological Status:       At baseline    Mental Status and Level of Consciousness: Alert and at baseline    Pulmonary Status:   O2 Device: Room air (10/16/13 1133)   Adequate oxygenation and airway patent    Complications related to anesthesia: None    Post-anesthesia assessment completed. Her mandible dislocated perioperatively, but self reduced in the PACU.  This has actually happened before with a prior EGD.  We did not do a jaw thrust perioperatively, so it was just from having the bite block in.  She can followup with her PCP if it happens again.  I spoke with Dr. Sherrlyn Hock about this patient and appreciate his recommendations for me trying to reduce the dislocation, but it fixed itself with only the patient moving her own jaw.    Signed By: Amedeo Plenty, MD     October 16, 2013

## 2013-10-16 NOTE — Procedures (Signed)
EGD: HH, diminutive gastric polyps  Rec: Check path    Norva Karvonen, MD

## 2013-10-16 NOTE — Procedures (Signed)
[place], [place]                                [phone]                                 PROCEDURE NOTE    NAME:  Jo Simmons, Jo Simmons                               MR:  [id number]  LOC:  ENDOENDOPL            SEX:  F                 ACCT:  [id number]  DOB:  [birth year]            AGE:  80                PT:  S  ADMIT:  10/16/2013          DSCH:                   MSV:        DATE OF PROCEDURE: 10/16/2013    INDICATIONS:  1. Gastric polyps.  2. Regurgitation.    POSTOPERATIVE DIAGNOSES:  1. Small hiatal hernia.  2. Diminutive gastric polyps.  3. Otherwise unremarkable appearing stomach. Biopsies obtained.    MEDICATIONS: Anesthesia.    PROCEDURE: Esophagogastroduodenoscopy with biopsy.    INSTRUMENT: [id number].    FINDINGS: After informed consent was obtained, the patient was sedated  and the gastroscope was inserted in the mouth and advanced into the  distal duodenum without difficulty. The scope was slowly withdrawn while  the mucosa was carefully examined, including a retroflex view of the  cardia and fundus.  1. ESOPHAGUS: There is a small gastric inlet patch in the proximal  esophagus. There was no esophagitis or Barrett's mucosa. There is a  normal-appearing Z-line. There is no stricture or obstruction.  2. STOMACH: There was a small hiatal hernia in the proximal stomach, 2-3  cm. There are 3 diminutive polyps in the gastric body and fundus, 3-5 mm,  along the greater curve and posterior wall. Polypectomy performed with a  cold forceps. The remainder of the gastric mucosa appeared normal without  ulceration or inflammation. Biopsies were obtained to rule out underlying  H pylori infection. A gastric aspirate was collected for gastric pH  testing. The folds did not appear overtly atrophic. The pylorus was  normal.  3. DUODENUM: The duodenal mucosa appeared normal including the  bulb,  sweep, and descending portions.    RECOMMENDATIONS:  1. Follow up pathology results.  2. Continue Zantac and avoid PPI therapy if possible.  3. Follow up in the office with Dr. Imelda Pillow for further recommendations.  Based on the patient's age and comorbidities, consider repeating  endoscopy on an as needed basis.  Silvio Clayman, MD                This is an unverified document unless signed by physician.    TID:  [id number]              DIC ID:  [id number]         DT:  10/16/2013 11:22 A  JOB:  [id number]           DOC#:  [id number]           DD:  10/16/2013    cc:   Silvio Clayman, MD        Zenovia Jarred, MD

## 2013-10-24 NOTE — Telephone Encounter (Signed)
Orders Placed This Encounter   ??? oxazepam (SERAX) 15 mg capsule     Sig: Take 1 capsule by mouth nightly as needed for Sleep or Anxiety.     Dispense:  30 capsule     Refill:  0     Per dr

## 2013-10-28 LAB — AMB POC URINALYSIS DIP STICK AUTO W/O MICRO
Bilirubin (UA POC): NEGATIVE
Glucose (UA POC): NEGATIVE mg/dL
Ketones (UA POC): NEGATIVE
Leukocyte esterase (UA POC): NEGATIVE
Nitrites (UA POC): NEGATIVE
Protein (UA POC): NEGATIVE
Specific gravity (UA POC): 1.025 (ref 1.001–1.035)
Urobilinogen (UA POC): 0.2 (ref 0.2–1)
pH (UA POC): 5.5 (ref 4.6–8.0)

## 2013-10-28 NOTE — Progress Notes (Signed)
[facility]  Olaoluwa Grieder C. Posey Pronto, M.D.  Campbell Riches, M.D.  [place], [place]  Ph No:  [phone]  Fax:  [phone]      CHIEF COMPLAINT  Follow-up visit/confusion      HISTORY OF PRESENT ILLNESS: Ms. Stull is a 80 y.o. female with past medical history positive for COPD, DJD, history of dementia, history of hypothyroidism.  She is in office today committed by her daughter stating that recently she had noted that mother's  Memory loss has been slightly worse especially in the evening hours.  She has some issues with hallucination.  No fever, no chest pain or nausea.  No vomiting, no diarrhea.  She had tried Aricept and give her so much side effect.  She stopped taking the pills because of excessive sleepiness    HISTORY:  Past Medical History   Diagnosis Date   ??? Anemia    ??? Arthritis    ??? Cardiomegaly      d/t age;  asymptomatic   ??? HH (hiatus hernia)    ??? Osteoarthritis    ??? Gastritis    ??? Anemia, pernicious      since childhood   ??? Hiatal hernia    ??? Chronic obstructive pulmonary disease      peer xray; asymptomatic; very mild/ no treatment   ??? GERD (gastroesophageal reflux disease)    ??? Thyroid disease      hypothyroid   ??? 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 Prolapsed urethral mucosa      occasional UTI   ??? Depression    ??? Dementia      with short term memory loss-needs assist with ADL's     History reviewed. No pertinent family history.  History     Social History   ??? Marital Status: WIDOWED     Spouse Name: N/A     Number of Children: N/A   ??? Years of Education: N/A     Social History Main Topics   ??? Smoking status: Never Smoker    ??? Smokeless tobacco: Not on file   ??? Alcohol Use: Yes      Comment: social   ??? Drug Use: No   ??? Sexually Active: No     Other Topics Concern   ??? Not on file     Social History Narrative   ??? No narrative on file       MEDICATIONS: Current outpatient  prescriptions:L-Methylfolate-B12-Acetylcyst (CEREFOLIN NAC) tablet, Take 1 tablet by mouth daily., Disp: 100 each, Rfl: 4;  oxazepam (SERAX) 15 mg capsule, Take 1 capsule by mouth nightly as needed for Sleep or Anxiety., Disp: 30 capsule, Rfl: 0;  ranitidine (ZANTAC) 150 mg tablet, Take 150 mg by mouth every morning., Disp: , Rfl: ;  vitamin E (AQUA GEMS) 400 unit capsule, Take  by mouth daily. Last dose 10/10/13, Disp: , Rfl:   loratadine (CLARITIN) 10 mg tablet, Take 10 mg by mouth every morning., Disp: , Rfl: ;  estradiol (ESTRACE) 0.01 % (0.1 mg/gram) vaginal cream, 1 gram vaginally 3x/week, Disp: 42.5 g, Rfl: 6;  cyanocobalamin (VITAMIN B12) 1,000 mcg/mL injection, 1 mL by IntraMUSCular route every thirty (30) days., Disp: 1 vial, Rfl: 3;  levothyroxine (SYNTHROID) 50 mcg tablet, Take 1 Tab by mouth Daily (before breakfast)., Disp: 90 Tab, Rfl: 3  PARoxetine (PAXIL) 20 mg tablet, Take 1 Tab by mouth daily., Disp: 90  Tab, Rfl: 3;  docusate sodium (COLACE) 100 mg capsule, Take 100 mg by mouth every morning., Disp: , Rfl: ;  calcium carbonate (CALTREX) 600 mg (1,500 mg) tablet, Take 600 mg by mouth daily., Disp: , Rfl: ;  MULTIVITAMIN WITH MINERALS (ONE-A-DAY 50 PLUS PO), Take  by mouth. Last dose 10/10/13, Disp: , Rfl:   budesonide-formoterol (SYMBICORT) 160-4.5 mcg/actuation HFA inhaler, Take 2 puffs by inhalation as needed. Does not use, Disp: , Rfl:       REVIEW OF SYSTEMS:  GENERAL: Negative weakness, negative fatigue, native malaise, negative chills, negative fever, negative night sweats, negative allergies.  INTEGUMENTARY: Negative rash, negative jaundice.  HEMATOPOIETIC: Negative bleeding, negative lymph node enlargement, negative bruisability.  NEUROLOGIC: worsening memory loss.  EYES: Negative visual changes, negative diplopia, negative scotomata, negative impaired vision.  EARS: Negative tinnitus, negative vertigo, negative hearing impairment.  NOSE AND THROAT: Negative postnasal drip, negative sore  throat.  CARDIOVASCULAR: Negative chest pain, negative dyspnea on exertion, negative palpations, negative edema. No history of heart attack, no history of arrhythmias.  RESPIRATORY: No history of shortness of breath, no history of asthma, no history of chronic obstructive pulmonary disease, no history of obstructive sleep apnea.  GASTROINTESTINAL: Negative dysphagia, negative nausea, negative vomiting, negative hematemesis, negative abdominal pain.  GENITOURINARY: Negative frequency, negative urgency, negative dysuria, negative incontinence. No history of STDs.   MUSCULOSKELETAL: positive for DJD, using walker for ambulation  PSYCHIATRIC: No history of psychiatric disorder.  ENDOCRINE: No history of alopecia, palpitations, sweats, dry skin, muscle weakness, fatigue.        PHYSICAL EXAM  Vitals - BP 149/78   Pulse 74   Resp 16   Ht 5\' 8"  (1.727 m)   Wt 154 lb (69.854 kg)   BMI 23.42 kg/m2   General appearance - alert, well appearing, and in no distress.  Mental status - alert, oriented to person, place, and time  Eyes - pupils equal and reactive, extraocular eye movements intact  Nose - normal and patent, no erythema, discharge or polyps  Mouth - mucous membranes moist, pharynx normal without lesions  Neck - supple, no significant adenopathy  Chest - clear to auscultation, no wheezes, rales or rhonchi, symmetric air entry  Heart - normal rate, regular rhythm, normal S1, S2,systolic murmur rubs, clicks or gallops  Abdomen - soft, nontender, nondistended, no masses or organomegaly  Back exam - range of motion limited  Neurological - she is alert, awake.  Cranial nerves appear intact.  Motor exam is normal.  Sensory exam normal..  She is not oriented to place or time.  Musculoskeletal - DJD changes noted  Extremities - no edema noted  Skin - normal coloration and turgor, no rashes, no suspicious skin lesions noted    LABS   Results for orders placed in visit on 10/28/13   AMB POC URINALYSIS DIP STICK AUTO W/O MICRO  (CIM)       Result Value Range    Color (UA POC) Yellow      Clarity (UA POC) Clear      Glucose (UA POC) Negative  Negative mg/dL    Bilirubin (UA POC) Negative  Negative    Ketones (UA POC) Negative  Negative    Specific gravity (UA POC) 1.025  1.001 - 1.035    Blood (UA POC) Trace  Negative    pH (UA POC) 5.5  4.6 - 8.0    Protein (UA POC) Negative  Negative    Urobilinogen (UA POC) 0.2 mg/dL  0.2 -  1    Nitrites (UA POC) Negative  Negative    Leukocyte esterase (UA POC) Negative  Negative           IMPRESSION     ICD-9-CM    1. Hypothyroidism 244.9    2. B12 deficiency 266.2    3. Depression 311    4. COPD (chronic obstructive pulmonary disease) 496    5. DJD (degenerative joint disease) 715.90    6. Dementia, without behavioral disturbance 294.20    7. HTN (hypertension) 401.9 AMB POC URINALYSIS DIP STICK AUTO W/O MICRO (CIM)         PLAN : I discussed with patient.  We did a UA that was negative..  She has a progressive dementia which may be getting worse..  I have added CereFolin one capsule daily.  Continue with current medication.  Follow-up is recommended.  See orders.        Zenovia Jarred, MD          Dictated using voice recognition software. Proofread, but unrecognized voice recognition errors may exist.

## 2013-10-31 MED ORDER — AMOXICILLIN 500 MG CAP
500 mg | ORAL_CAPSULE | Freq: Three times a day (TID) | ORAL | Status: AC
Start: 2013-10-31 — End: 2013-11-07

## 2013-10-31 NOTE — Telephone Encounter (Signed)
Orders Placed This Encounter   ??? amoxicillin (AMOXIL) 500 mg capsule     Sig: Take 1 Cap by mouth three (3) times daily for 7 days.     Dispense:  21 Cap     Refill:  0     Per dr

## 2013-11-05 NOTE — Telephone Encounter (Signed)
Daughter called in asking if the new sleeping medication (Serax 15 mg) would make her mother feel tired, weak and have her urine to be dark color, patient still on medication from 10/28/13 visit and has appointment on 11/06/13. She wanted Dr. Posey Pronto to tell her if she should wait for appointment tomorrow. As per Dr. Posey Pronto call patient and tell her fine to wait and he would see her tomorrow.

## 2013-11-06 LAB — AMB POC URINALYSIS DIP STICK AUTO W/O MICRO
Blood (UA POC): NEGATIVE
Glucose (UA POC): NEGATIVE mg/dL
Ketones (UA POC): 15
Leukocyte esterase (UA POC): NEGATIVE
Nitrites (UA POC): NEGATIVE
Protein (UA POC): 100
Protein (UA POC): 150 mg/dL
Protein Total Urine: 30
Protein-Creat Ratio: 300 mg/g
Specific gravity (UA POC): 1.025 (ref 1.001–1.035)
Urobilinogen (UA POC): 2 (ref 0.2–1)
pH (UA POC): 5.5 (ref 4.6–8.0)

## 2013-11-06 MED ORDER — OXAZEPAM 10 MG CAP
10 mg | ORAL_CAPSULE | Freq: Every evening | ORAL | Status: DC | PRN
Start: 2013-11-06 — End: 2013-11-26

## 2013-11-06 NOTE — Progress Notes (Signed)
[facility]  Zubin Pontillo C. Posey Pronto, M.D.  Campbell Riches, M.D.  [place], [place]  Ph No:  [phone]  Fax:  [phone]      CHIEF COMPLAINT: follow-up visit         HISTORY OF PRESENT ILLNESS: Ms. Fitzsimons is a 80 y.o. female with past medical history positive for DJD, history of heart or hernia.  History of COPD she seen office today stating that she had issues with her mental status change about 2 days ago, but this morning she seems to be fine.  Daughter thought that she may have UTI, so she wanted her to have urinalysis checkup done.  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  She also thought that Serax was too strong and they want her to cut down the dose.      HISTORY:  No Known Allergies  Past Medical History   Diagnosis Date   ??? Anemia    ??? Arthritis    ??? Cardiomegaly      d/t age;  asymptomatic   ??? HH (hiatus hernia)    ??? Osteoarthritis    ??? Gastritis    ??? Anemia, pernicious      since childhood   ??? Hiatal hernia    ??? Chronic obstructive pulmonary disease      peer xray; asymptomatic; very mild/ no treatment   ??? GERD (gastroesophageal reflux disease)    ??? Thyroid disease      hypothyroid   ??? 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 Prolapsed urethral mucosa      occasional UTI   ??? Depression    ??? Dementia      with short term memory loss-needs assist with ADL's     Past Surgical History   Procedure Laterality Date   ??? Hx endoscopy     ??? Hx tonsillectomy     ??? Hx appendectomy     ??? Hx orthopaedic       right hip replacement     No family history on file.  History     Social History   ??? Marital Status: WIDOWED     Spouse Name: N/A     Number of Children: N/A   ??? Years of Education: N/A     Occupational History   ??? Not on file.     Social History Main Topics   ??? Smoking status: Never Smoker    ??? Smokeless tobacco: Not  on file   ??? Alcohol Use: Yes      Comment: social   ??? Drug Use: No   ??? Sexual Activity: No     Other Topics Concern   ??? Not on file     Social History Narrative     Current Outpatient Prescriptions   Medication Sig Dispense Refill   ??? oxazepam (SERAX) 10 mg capsule Take 1 Cap by mouth nightly as needed for Sleep or Anxiety. Max Daily Amount: 10 mg.  30 Cap  0   ??? amoxicillin (AMOXIL) 500 mg capsule Take 1 Cap by mouth three (3) times daily for 7 days.  21 Cap  0   ??? ranitidine (ZANTAC) 150 mg tablet Take 150 mg by mouth every morning.       ??? vitamin E (AQUA GEMS)  400 unit capsule Take  by mouth daily. Last dose 10/10/13       ??? loratadine (CLARITIN) 10 mg tablet Take 10 mg by mouth every morning.       ??? budesonide-formoterol (SYMBICORT) 160-4.5 mcg/actuation HFA inhaler Take 2 puffs by inhalation as needed. Does not use       ??? estradiol (ESTRACE) 0.01 % (0.1 mg/gram) vaginal cream 1 gram vaginally 3x/week  42.5 g  6   ??? cyanocobalamin (VITAMIN B12) 1,000 mcg/mL injection 1 mL by IntraMUSCular route every thirty (30) days.  1 vial  3   ??? levothyroxine (SYNTHROID) 50 mcg tablet Take 1 Tab by mouth Daily (before breakfast).  90 Tab  3   ??? PARoxetine (PAXIL) 20 mg tablet Take 1 Tab by mouth daily.  90 Tab  3   ??? docusate sodium (COLACE) 100 mg capsule Take 100 mg by mouth every morning.       ??? calcium carbonate (CALTREX) 600 mg (1,500 mg) tablet Take 600 mg by mouth daily.       ??? MULTIVITAMIN WITH MINERALS (ONE-A-DAY 50 PLUS PO) Take  by mouth. Last dose 10/10/13           MEDICATIONS:  Current outpatient prescriptions:oxazepam (SERAX) 10 mg capsule, Take 1 Cap by mouth nightly as needed for Sleep or Anxiety. Max Daily Amount: 10 mg., Disp: 30 Cap, Rfl: 0;  amoxicillin (AMOXIL) 500 mg capsule, Take 1 Cap by mouth three (3) times daily for 7 days., Disp: 21 Cap, Rfl: 0;  ranitidine (ZANTAC) 150 mg tablet, Take 150 mg by mouth every morning., Disp: , Rfl:   vitamin E (AQUA GEMS) 400 unit capsule, Take  by mouth daily.  Last dose 10/10/13, Disp: , Rfl: ;  loratadine (CLARITIN) 10 mg tablet, Take 10 mg by mouth every morning., Disp: , Rfl: ;  budesonide-formoterol (SYMBICORT) 160-4.5 mcg/actuation HFA inhaler, Take 2 puffs by inhalation as needed. Does not use, Disp: , Rfl: ;  estradiol (ESTRACE) 0.01 % (0.1 mg/gram) vaginal cream, 1 gram vaginally 3x/week, Disp: 42.5 g, Rfl: 6  cyanocobalamin (VITAMIN B12) 1,000 mcg/mL injection, 1 mL by IntraMUSCular route every thirty (30) days., Disp: 1 vial, Rfl: 3;  levothyroxine (SYNTHROID) 50 mcg tablet, Take 1 Tab by mouth Daily (before breakfast)., Disp: 90 Tab, Rfl: 3;  PARoxetine (PAXIL) 20 mg tablet, Take 1 Tab by mouth daily., Disp: 90 Tab, Rfl: 3;  docusate sodium (COLACE) 100 mg capsule, Take 100 mg by mouth every morning., Disp: , Rfl:   calcium carbonate (CALTREX) 600 mg (1,500 mg) tablet, Take 600 mg by mouth daily., Disp: , Rfl: ;  MULTIVITAMIN WITH MINERALS (ONE-A-DAY 50 PLUS PO), Take  by mouth. Last dose 10/10/13, Disp: , Rfl:       REVIEW OF SYSTEMS:  GENERAL/CONSTITUTIONAL: change in mental status  HEAD, EYES, EARS, NOSE AND THROAT: negative  CARDIOVASCULAR: negative  RESPIRATORY:  negative   GASTROINTESTINAL: negative  GENITOURINARY: negative  MUSCULOSKELETAL: negative  BREASTS: negative  SKIN:  negative  NEUROLOGIC: mental status change  PSYCHIATRIC: negative  ENDOCRINE:  negative  HEMATOLOGIC/LYMPHATIC:  negative  ALLERGIC/IMMUNOLOGIC:  negative        PHYSICAL EXAM  Vital Signs - BP 130/88    Pulse 89    Ht 5\' 8"  (1.727 m)    Wt 150 lb (68.04 kg)    BMI 22.81 kg/m2      Constitutional - alert, well appearing, and in no distress.  Eyes - pupils equal and reactive, extraocular eye  movements intact.  Ear, Nose, Mouth, Throat - normal  Examination of oropharynx: oral mucosa moist  Neck - supple, no significant adenopathy.   Respiratory - clear to auscultation, no wheezes, rales or bronchi, symmetric air entry.  Cardiovascular - normal rate, regular rhythm, normal S1, S2,  systolic murmur     Gastrointestinal - Abdomen soft, nontender, nondistended, no masses.  Back exam - range of motion is limited  Musculoskeletal -  DJD changes are noted.  Skin - normal coloration and turgor, no rashes, no suspicious skin lesions noted.  Neurological - Cranial nerves with notation of any deficits. Motor and sensory exam is intact   Extremities - peripheral pulses normal, no pedal edema, no clubbing or cyanosis  Psychiatric - alert, oriented to person, place, and time. anxious    LABS  Results for orders placed in visit on 11/06/13   AMB POC URINALYSIS DIP STICK AUTO W/O MICRO (CIM)       Result Value Ref Range    Color (UA POC) Orange      Clarity (UA POC) Clear      Glucose (UA POC) Negative  Negative mg/dL    Bilirubin (UA POC) Moderate  Negative    Ketones (UA POC) 15   Negative    Specific gravity (UA POC) 1.025  1.001 - 1.035    Blood (UA POC) Negative  Negative    pH (UA POC) 5.5  4.6 - 8.0    Protein (UA POC) 100   Negative    Urobilinogen (UA POC) 2 mg/dL  0.2 - 1    Nitrites (UA POC) Negative  Negative    Leukocyte esterase (UA POC) Negative  Negative    Protein Total Urine 30      Protein-Creat Ratio 300      Protein (UA POC) 150  Negative mg/dL             IMPRESSION:    ICD-9-CM    1. Altered mental status 780.97 AMB POC URINALYSIS DIP STICK AUTO W/O MICRO (CIM)   2. Hypothyroidism 244.9    3. Depression 311    4. HTN (hypertension) 401.9           PLAN:  I discussed with patient.  We did a UA that was negative.  Advised to continue current medication.  Her mental status appeared to be back to baseline.  She does not have any focal neurological deficit.  Reassurance given to daughter if she has any worsening of the status to go to emergency room.  She understand her conditions.    Stevie KernSudhirkumar C Golda Zavalza, MD          Dictated using voice recognition software. Proofread, but unrecognized voice recognition errors may exist.

## 2013-11-25 LAB — AMB POC COMPLETE CBC,AUTOMATED ENTER
ABS. GRANS (POC): 2.7 10*3/uL (ref 1.4–6.5)
ABS. LYMPHS (POC): 1 10*3/uL — AB (ref 1.2–3.4)
ABS. MONOS (POC): 0.3 10*3/uL (ref 0.1–0.6)
GRANULOCYTES (POC): 68.1 % (ref 42.2–75.2)
HCT (POC): 39.3 % (ref 35–60)
HGB (POC): 13.8 g/dL (ref 11–18)
LYMPHOCYTES (POC): 24.6 % (ref 20.5–51.1)
MCH (POC): 29.1 pg (ref 27–31)
MCHC (POC): 35.1 g/dL (ref 33–37)
MCV (POC): 82.9 fL (ref 80–99.9)
MONOCYTES (POC): 7.3 % (ref 1.7–9.3)
MPV (POC): 6.4 fL — AB (ref 7.8–11)
PLATELET (POC): 274 10*3/uL (ref 150–450)
RBC (POC): 4.74 10*6/uL (ref 4–6)
RDW (POC): 14.1 % — AB (ref 11.6–13.7)
WBC (POC): 3.9 10*3/uL — AB (ref 4.5–10.5)

## 2013-11-25 MED ORDER — MEMANTINE 28 MG SPRINKLE ER 24HR CAPSULE
28 mg | ORAL_CAPSULE | Freq: Every day | ORAL | Status: DC
Start: 2013-11-25 — End: 2013-12-11

## 2013-11-25 MED ORDER — PANTOPRAZOLE 40 MG TAB, DELAYED RELEASE
40 mg | ORAL_TABLET | Freq: Every day | ORAL | Status: DC
Start: 2013-11-25 — End: 2014-03-16

## 2013-11-25 NOTE — Progress Notes (Addendum)
[facility]  Jo Simmons. Jo Simmons, M.D.  Jo Simmons, M.D.  [place], [place]  Ph No:  [phone]  Fax:  [phone]      CHIEF COMPLAINT: follow-up visit/dark color stool/reflux symptoms         HISTORY OF PRESENT ILLNESS: Jo Simmons is a 80 y.o. female with past medical history positive for DJD, history of gastritis, history of COPD, dementia.  She is seen in office today.  Come in by daughter, stating that they had talked to gastroenterologist. She has some history of vomiting and also have dark color stool.  They advised her to come here for evaluation.  She is also was on Protonix and will change over to Zantac and daughter think that might be causing her issues.  She is also had worsening dementia.  No fever, no chills, no history of any weight loss.      HISTORY:  No Known Allergies  Past Medical History   Diagnosis Date   ??? Anemia    ??? Arthritis    ??? Cardiomegaly      d/t age;  asymptomatic   ??? HH (hiatus hernia)    ??? Osteoarthritis    ??? Gastritis    ??? Anemia, pernicious      since childhood   ??? Hiatal hernia    ??? Chronic obstructive pulmonary disease      peer xray; asymptomatic; very mild/ no treatment   ??? GERD (gastroesophageal reflux disease)    ??? Thyroid disease      hypothyroid   ??? 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 Prolapsed urethral mucosa      occasional UTI   ??? Depression    ??? Dementia      with short term memory loss-needs assist with ADL's     Past Surgical History   Procedure Laterality Date   ??? Hx endoscopy     ??? Hx tonsillectomy     ??? Hx appendectomy     ??? Hx orthopaedic       right hip replacement     No family history on file.  History     Social History   ??? Marital Status: WIDOWED     Spouse Name: N/A     Number of Children: N/A   ??? Years of Education: N/A     Occupational History   ??? Not on file.     Social History Main Topics   ??? Smoking status: Never Smoker    ??? Smokeless tobacco: Not on file    ??? Alcohol Use: Yes      Comment: social   ??? Drug Use: No   ??? Sexual Activity: No     Other Topics Concern   ??? Not on file     Social History Narrative     Current Outpatient Prescriptions   Medication Sig Dispense Refill   ??? memantine ER (NAMENDA XR) 28 mg capsule Take 1 Cap by mouth daily.  90 Cap  4   ??? pantoprazole (PROTONIX) 40 mg tablet Take 1 Tab by mouth daily.  90 Tab  4   ??? ergocalciferol (VITAMIN D2) 50,000 unit capsule Take 50,000 Units by mouth every thirty (30) days.       ??? oxazepam (SERAX) 10 mg capsule Take 1 Cap by mouth nightly as needed for Sleep or Anxiety. Max Daily Amount: 10 mg.  30 Cap  0   ??? vitamin E (AQUA GEMS) 400 unit capsule Take  by mouth daily. Last dose 10/10/13       ??? loratadine (CLARITIN) 10 mg tablet Take 10 mg by mouth every morning.       ??? budesonide-formoterol (SYMBICORT) 160-4.5 mcg/actuation HFA inhaler Take 2 puffs by inhalation as needed. Does not use       ??? estradiol (ESTRACE) 0.01 % (0.1 mg/gram) vaginal cream 1 gram vaginally 3x/week  42.5 g  6   ??? cyanocobalamin (VITAMIN B12) 1,000 mcg/mL injection 1 mL by IntraMUSCular route every thirty (30) days.  1 vial  3   ??? levothyroxine (SYNTHROID) 50 mcg tablet Take 1 Tab by mouth Daily (before breakfast).  90 Tab  3   ??? PARoxetine (PAXIL) 20 mg tablet Take 1 Tab by mouth daily.  90 Tab  3   ??? docusate sodium (COLACE) 100 mg capsule Take 100 mg by mouth every morning.       ??? calcium carbonate (CALTREX) 600 mg (1,500 mg) tablet Take 600 mg by mouth daily.       ??? MULTIVITAMIN WITH MINERALS (ONE-A-DAY 50 PLUS PO) Take  by mouth. Last dose 10/10/13           MEDICATIONS:  Current outpatient prescriptions:memantine ER (NAMENDA XR) 28 mg capsule, Take 1 Cap by mouth daily., Disp: 90 Cap, Rfl: 4;  pantoprazole (PROTONIX) 40 mg tablet, Take 1 Tab by mouth daily., Disp: 90 Tab, Rfl: 4;  ergocalciferol (VITAMIN D2) 50,000 unit capsule, Take 50,000 Units by mouth every thirty (30) days., Disp: , Rfl:   oxazepam (SERAX) 10 mg capsule,  Take 1 Cap by mouth nightly as needed for Sleep or Anxiety. Max Daily Amount: 10 mg., Disp: 30 Cap, Rfl: 0;  vitamin E (AQUA GEMS) 400 unit capsule, Take  by mouth daily. Last dose 10/10/13, Disp: , Rfl: ;  loratadine (CLARITIN) 10 mg tablet, Take 10 mg by mouth every morning., Disp: , Rfl:   budesonide-formoterol (SYMBICORT) 160-4.5 mcg/actuation HFA inhaler, Take 2 puffs by inhalation as needed. Does not use, Disp: , Rfl: ;  estradiol (ESTRACE) 0.01 % (0.1 mg/gram) vaginal cream, 1 gram vaginally 3x/week, Disp: 42.5 g, Rfl: 6;  cyanocobalamin (VITAMIN B12) 1,000 mcg/mL injection, 1 mL by IntraMUSCular route every thirty (30) days., Disp: 1 vial, Rfl: 3  levothyroxine (SYNTHROID) 50 mcg tablet, Take 1 Tab by mouth Daily (before breakfast)., Disp: 90 Tab, Rfl: 3;  PARoxetine (PAXIL) 20 mg tablet, Take 1 Tab by mouth daily., Disp: 90 Tab, Rfl: 3;  docusate sodium (COLACE) 100 mg capsule, Take 100 mg by mouth every morning., Disp: , Rfl: ;  calcium carbonate (CALTREX) 600 mg (1,500 mg) tablet, Take 600 mg by mouth daily., Disp: , Rfl:   MULTIVITAMIN WITH MINERALS (ONE-A-DAY 50 PLUS PO), Take  by mouth. Last dose 10/10/13, Disp: , Rfl:       REVIEW OF SYSTEMS:  GENERAL/CONSTITUTIONAL: negative  HEAD, EYES, EARS, NOSE AND THROAT: negative  CARDIOVASCULAR: negative  RESPIRATORY:  negative   GASTROINTESTINAL:positive for nausea, vomiting , dark color stool  GENITOURINARY: negative  MUSCULOSKELETAL: negative  BREASTS: negative  SKIN:  negative  NEUROLOGIC: negative  PSYCHIATRIC: negative  ENDOCRINE:  negative  HEMATOLOGIC/LYMPHATIC:  negative  ALLERGIC/IMMUNOLOGIC:  negative        PHYSICAL EXAM  Vital Signs - There were no vitals taken for this visit.   Constitutional - alert, well appearing, and in no distress.  Eyes - pupils equal and reactive, extraocular  eye movements intact.  Ear, Nose, Mouth, Throat - normal  Examination of oropharynx: oral mucosa moist  Neck - supple, no significant adenopathy.   Respiratory - clear  to auscultation, no wheezes, rales or bronchi, symmetric air entry.  Cardiovascular - normal rate, regular rhythm, normal S1, S2, systolic murmur     Gastrointestinal - Abdomen soft, nontender, nondistended, no masses. Hemoccult is guaiac negative  Back exam -range of motion  Musculoskeletal - DJD changes noted.  Using walker for ambulation.  Skin - normal coloration and turgor, no rashes, no suspicious skin lesions noted.  Neurological - Cranial nerves with notation of any deficits. Motor and sensory exam is intact   Extremities - peripheral pulses normal, no pedal edema, no clubbing or cyanosis  Psychiatric - pleasantly confused, cheerful    LABS  Results for orders placed in visit on 11/25/13   AMB POC COMPLETE CBC,AUTOMATED ENTER       Result Value Ref Range    WBC (POC) 3.9 (*) 4.5 - 10.5 10^3/ul    LYMPHOCYTES (POC) 24.6  20.5 - 51.1 %    MONOCYTES (POC) 7.3  1.7 - 9.3 %    GRANULOCYTES (POC) 68.1  42.2 - 75.2 %    ABS. LYMPHS (POC) 1.0 (*) 1.2 - 3.4 10^3/ul    ABS. MONOS (POC) 0.3  0.1 - 0.6 10^3/ul    ABS. GRANS (POC) 2.7  1.4 - 6.5 10^3/ul    RBC (POC) 4.74  4 - 6 10^6/ul    HGB (POC) 13.8  11 - 18 g/dL    HCT (POC) 98.139.3  35 - 60 %    MCV (POC) 82.9  80 - 99.9 fL    MCH (POC) 29.1  27 - 31 pg    MCHC (POC) 35.1  33 - 37 g/dL    RDW (POC) 19.114.1 (*) 47.811.6 - 13.7 %    PLATELET (POC) 274  150 - 450 10^3/ul    MPV (POC) 6.4 (*) 7.8 - 11 fL             IMPRESSION:    ICD-9-CM    1. GERD (gastroesophageal reflux disease) 530.81 AMB POC FECAL BLOOD, OCCULT, QL 1 CARD     AMB POC COMPLETE CBC,AUTOMATED ENTER     COLLECTION VENOUS BLOOD,VENIPUNCTURE   2. Hypothyroidism 244.9    3. Depression 311    4. B12 deficiency 266.2    5. COPD (chronic obstructive pulmonary disease) 496    6. Dark stools 792.1 AMB POC FECAL BLOOD, OCCULT, QL 1 CARD          PLAN:  I discussed with patient.  We did a Hemoccult stool card that was guaiac negative.  We did a CBC that is stable.  I advised her that he should start taking her Protonix  on a daily basis.  I also added Namenda starter pack given samples from the office for her dementia.  She will continue her B12 injection.  She will continue her Cerefolin  dietary compliance.  Follow-up was recommended.  See orders and medication list..  Stevie KernSudhirkumar Simmons Jeet Shough, MD          Dictated using voice recognition software. Proofread, but unrecognized voice recognition errors may exist.

## 2013-11-26 LAB — AMB POC FECAL BLOOD, OCCULT, QL 1 CARD: Hemoccult (POC): NEGATIVE

## 2013-11-26 MED ORDER — OXAZEPAM 10 MG CAP
10 mg | ORAL_CAPSULE | Freq: Every evening | ORAL | Status: DC | PRN
Start: 2013-11-26 — End: 2013-12-02

## 2013-11-26 NOTE — Telephone Encounter (Signed)
Orders Placed This Encounter   ??? oxazepam (SERAX) 10 mg capsule     Sig: Take 1 Cap by mouth nightly as needed for Sleep or Anxiety. Max Daily Amount: 10 mg.     Dispense:  30 Cap     Refill:  0     Per dr    Pt forgot to ask when she was in office yesterday/keg

## 2013-11-27 MED ORDER — PAROXETINE 20 MG TAB
20 mg | ORAL_TABLET | Freq: Every day | ORAL | Status: DC
Start: 2013-11-27 — End: 2014-02-25

## 2013-11-27 NOTE — Progress Notes (Signed)
See duplicate note

## 2013-12-02 MED ORDER — OXAZEPAM 10 MG CAP
10 mg | ORAL_CAPSULE | Freq: Every evening | ORAL | Status: DC | PRN
Start: 2013-12-02 — End: 2014-03-18

## 2013-12-02 NOTE — Progress Notes (Signed)
[facility]  Drayton Tieu C. Allena Katz, M.D.  Deloris Ping, M.D.  [address]  [place], [place] [address]  Ph No:  [phone]  Fax:  [phone]        IPPE Welcome to [facility]  G0402, [phone], G0405  (1 time during first 12 months on Medicare) Initial AWVw/PPS [id number]  (1 time only after 1 st12 months of Medicare B eligibility AND 1 year after IPPE.) Subsequent AWV w/PPS  Q0347  (Anually at least 12 months after Initial AWV w/PPS   Medicare Part B Eligibility Date:   Date of Last Exam:     Date of Last IPPE or AWV:       _x__  I reviewed the patient's individual and family history with the patient.  Significant findings and/or changes were noted on patient's history and include:  Past Medical History   Diagnosis Date   ??? Anemia    ??? Arthritis    ??? Cardiomegaly      d/t age;  asymptomatic   ??? HH (hiatus hernia)    ??? Osteoarthritis    ??? Gastritis    ??? Anemia, pernicious      since childhood   ??? Hiatal hernia    ??? Chronic obstructive pulmonary disease      peer xray; asymptomatic; very mild/ no treatment   ??? GERD (gastroesophageal reflux disease)    ??? Thyroid disease      hypothyroid   ??? 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 Prolapsed urethral mucosa      occasional UTI   ??? Depression    ??? Dementia      with short term memory loss-needs assist with ADL's     Past Surgical History   Procedure Laterality Date   ??? Hx endoscopy     ??? Hx tonsillectomy     ??? Hx appendectomy     ??? Hx orthopaedic       right hip replacement       _x__ I have reviewed the patient's chronic and acute problem list and risk factors with the patient.  Significant findings and/or changes were noted on the patient's problem list and include:      Patient Active Problem List   Diagnosis Code   ??? Hypothyroidism 244.9   ??? B12 deficiency 266.2   ??? Depression 311   ??? Hip fx 820.8   ??? COPD (chronic obstructive pulmonary disease) 496   ??? Esophageal reflux 530.81   ??? DJD (degenerative joint  disease) 715.90   ??? Prolapse urethral mucosa 599.5    RIsk Factors for Heart failure:  dyslipidemia, hypertension, stress, thyroid dysfunction    _x__  Reviewed patient's list of providers and suppliers regularly involved in the patient's care with the patient.  Significant findings and/or changes were noted.    _x__  Reviewed patient's list of allergies with the patient.  Significant findings and /or changes were noted on the patient's allergy list and include:  No Known Allergies    Advance Care Planning:  (At discretion of patient)       Patient was offered the opportunity to discuss advance care planning:YES  Does patient have an Advance Directive: YES      ADL Assessment    Does the patient exhibit a steady gait: YES uses walker   How long did it take the patient to get up and walk from a sitting position? 5 sec  Is the patient self reliant?  (i.e. Can she do her own laundry, prepare meals, do household chores?) NO  Does the patient handle her own medications? NO  Does the patient handle her own money? NO  Is the patient's home safe? (i.e. Good lighting, handrails on stairs and bath, etc.) YES  Did you notice or did patient express any hearing difficulties? NO  Did you notice or did the patient express any vision difficulties:  NO  Were distance and reading eye charts used?  NO      Depression Risk Factor Screening:     Patient Health Questionnaire (PHQ-2)   Over the last 2 weeks, how often have you been bothered by any of the following problems?  ?? Little interest or pleasure in doing things?  ?? Not at all. [0]  ?? Feeling down, depressed, or hopeless?   ?? Several days. [1]    Total Score: 1/6  PHQ-2 Assessment Scoring:   A score of 2 or more requires further screening with the PHQ-9  N/A  Alcohol Risk Factor Screening:   On any occasion during the past 3 months, have you had more than 3 drinks containing alcohol?  No    Do you average more than 7 drinks per week?  No    Functional Ability and Level of Safety:    Limited    Hearing Loss   none    Activities of Daily Living   Partial assistance.   Requires assistance with: ambulation, bathing and hygiene, feeding, grooming and toileting    Fall Risk   No fall risk factors    Abuse Screen   Patient is not abused    Review of Systems   A comprehensive review of systems was negative.  Examination   Physical Examination    Vitals - BP 123/79    Pulse 87    Ht 5\' 8"  (1.727 m)          Elderly female, alert, awake no acute distress noted    Results for orders placed in visit on 11/25/13   AMB POC FECAL BLOOD, OCCULT, QL 1 CARD       Result Value Ref Range    VALID INTERNAL CONTROL POC Yes      Hemoccult (POC) Negative  Negative   AMB POC COMPLETE CBC,AUTOMATED ENTER       Result Value Ref Range    WBC (POC) 3.9 (*) 4.5 - 10.5 10^3/ul    LYMPHOCYTES (POC) 24.6  20.5 - 51.1 %    MONOCYTES (POC) 7.3  1.7 - 9.3 %    GRANULOCYTES (POC) 68.1  42.2 - 75.2 %    ABS. LYMPHS (POC) 1.0 (*) 1.2 - 3.4 10^3/ul    ABS. MONOS (POC) 0.3  0.1 - 0.6 10^3/ul    ABS. GRANS (POC) 2.7  1.4 - 6.5 10^3/ul    RBC (POC) 4.74  4 - 6 10^6/ul    HGB (POC) 13.8  11 - 18 g/dL    HCT (POC) 39.3  35 - 60 %    MCV (POC) 82.9  80 - 99.9 fL    MCH (POC) 29.1  27 - 31 pg    MCHC (POC) 35.1  33 - 37 g/dL    RDW (POC) 14.1 (*) 11.6 - 13.7 %    PLATELET (POC) 274  150 - 450 10^3/ul    MPV (POC) 6.4 (*) 7.8 - 11 fL     No results found for any visits on  12/02/13.      Evaluation of Cognitive Function:  Mood/affect:  happy  Appearance: age appropriate  Family member/caregiver input: daughter      ___   Medicare Part B Preventive Services Limitations Recommendation Scheduled   Bone Mass Measurement  (age 78 & older, biennial) Requires diagnosis related to osteoporosis or estrogen deficiency. Biennial benefit unless patient has history of long-term glucocorticoid tx or baseline is needed because initial test was by other method Completed Completed 02/27/13   Cardiovascular Screening Blood Tests (every 5 years)  Total  cholesterol, HDL, Triglycerides Order as a panel if possible completed completed   Colorectal Cancer Screening  -Fecal occult blood test (annual)  -Flexible sigmoidoscopy (5y)  -Screening colonoscopy (10y)  -Barium Enema  Age appropriate, not indicated aage appropriate, not indicated   Counseling to Prevent Tobacco Use (up to 8 sessions per year)  - Counseling greater than 3 and up to 10 minutes  - Counseling greater than 10 minutes Patients must be asymptomatic of tobacco-related conditions to receive as preventive service nonsmoker nonsmoker   Diabetes Screening Tests (at least every 3 years, Medicare covers annually or at 1444-month intervals for prediabetic patients)    Fasting blood sugar (FBS) or glucose tolerance test (GTT) Patient must be diagnosed with one of the following:  -Hypertension, Dyslipidemia, obesity, previous impaired FBS or GTT  ???Or any two of the following: overweight, FH of diabetes, age ?65, history of gestational diabetes, birth of baby weighing more than 9 pounds Age appropriate completed   Diabetes Self-Management Training (DSMT) (no USPSTF recommendation) Requires referral by treating physician for patient with diabetes or renal disease. 10 hours of initial DSMT session of no less than 30 minutes each in a continuous 6922-month period.  2 hours of follow-up DSMT in subsequent years. nondiabetic nondiabetic   Glaucoma Screening (no USPSTF recommendation) Diabetes mellitus, family history, African American, age 10350 or over, Hispanic American, age 78 or over 08/25/2012, completed Review this year 2015   Human Immunodeficiency Virus (HIV) Screening (annually for increased risk patients)  HIV-1 and HIV-2 by EIA, ELISA, rapid antibody test, or oral mucosa transudate Patient must be at increased risk for HIV infection per USPSTF guidelines or pregnant.  Tests covered annually for patients at increased risk.  Pregnant patients may receive up to 3 test during pregnancy. Not applicable Not applicable    Medical Nutrition Therapy (MNT) (for diabetes or renal disease not recommended schedule) Requires referral by treating physician for patient with diabetes or renal disease.  Can be provided in same year as diabetes self-management training (DSMT), and CMS recommends medical nutrition therapy take place after DSMT.  Up to 3 hours for initial year and 2 hours in subsequent years. Not applicable Not applicable   Prostate Cancer Screening (annually up to age 78)  - Digital rectal exam (DRE)  - Prostate specific antigen (PSA) Annually (age 78 or over), DRE not paid separately when covered E/M service is provided on same date Not applicable Not applicable   Seasonal Influenza Vaccination (annually)  Up-to-date Up-to-date   Pneumococcal Vaccination (once after 65)  Up-to-date Up-to-date   Hepatitis B Vaccinations (if medium/high risk) Medium/high risk factors:  End-stage renal disease,  Hemophiliacs who received Factor VIII or IX concentrates, Clients of institutions for the mentally retarded, Persons who live in the same house as a HepB virus carrier, Homosexual men, Illicit injectable drug abusers. Not applicable Not applicable   Screening Mammography (biennial age 78-74)? Annually (age 78 or over) Age appropriate Not  indicated   Screening Pap Tests and Pelvic Examination (up to age 68 and after 27 if unknown history or abnormal study last 10 years) Every 24 months except high risk aage appropriate Not indicated   Ultrasound Screening for Abdominal Aortic Aneurysm (AAA) (once) Patient must be referred through IPPE and not have had a screening for abdominal aortic aneurysm before under Medicare.  Limited to patients who meet one of the following criteria:  - Men who are 35-14 years old and have smoked more than 100 cigarettes in their lifetime.  -Anyone with a FH of AAA  -Anyone recommended for screening by USPSTF Not indicated Not applicable   Family Practice Management 2011      Advice/Referrals/Counseling:   Education  and counseling provided:  Are appropriate based on today's review and evaluation      Assessment/Plan:     Additional education and Counseling:   begin progressive daily aerobic exercise program, use calcium 1 gram daily with Vit D, continue current medications and continue current healthy lifestyle patterns      I discussed with daughter who is the primary caretaker for her.  She is doing excellent job.  She will continue take care of her.  We discussed regarding her medication compliance.  Follow-up is recommended.  See orders.  3 months follow-up.

## 2013-12-03 MED ORDER — CYANOCOBALAMIN 1,000 MCG/ML IJ SOLN
1000 mcg/mL | INTRAMUSCULAR | Status: DC
Start: 2013-12-03 — End: 2014-10-22

## 2013-12-03 NOTE — Telephone Encounter (Signed)
Orders Placed This Encounter   ??? cyanocobalamin (VITAMIN B12) 1,000 mcg/mL injection     Sig: 1 mL by IntraMUSCular route every thirty (30) days.     Dispense:  3 Vial     Refill:  5     Per dr

## 2013-12-11 NOTE — Telephone Encounter (Signed)
I spoke to [place], [place]

## 2013-12-16 NOTE — Telephone Encounter (Signed)
I spoke with Jo Simmons, she is going to stop the medicine,cv

## 2013-12-18 LAB — AMB POC URINALYSIS DIP STICK AUTO W/ MICRO

## 2013-12-18 MED ORDER — ESTRADIOL 0.01% (0.1 MG/G) VAGINAL CREAM
0.01 % (0.1 mg/gram) | VAGINAL | Status: DC
Start: 2013-12-18 — End: 2014-10-22

## 2013-12-18 MED ORDER — ESTRADIOL 0.01% (0.1 MG/G) VAGINAL CREAM
0.01 % (0.1 mg/gram) | VAGINAL | Status: DC
Start: 2013-12-18 — End: 2013-12-18

## 2013-12-18 NOTE — Progress Notes (Signed)
[facility]  [address] [place] [address]  [phone]    Jo Simmons  DOB: 05-Oct-1927    Chief Complaint   Patient presents with   ??? Incontinence     6 month f/u for prolapse uretheral mucosa          HPI     Jo Simmons is a 80 y.o. female who presents to office for 59month follow up on questionable urethral mucosal prolapse.  03/2013 cysto by Dr Tamala Julian was normal other than urethral prolapse.  Patient is using vaginal estrogen cream regularly.  Patient reports 2-3 UTIs in the past 27months.  07/01/14 urine culture grew >100,000 colonies and antibiotic Rxd by Dr Posey Pronto. With the 2 other UTIs, no UA was checked and antibiotics were just called in over the phone.     Patient initially presented to office on 02/10/13 for evaluation of urethral prolapse referred by Dr Laurann Montana (oncology). Patient saw a Dealer in [place] approximately 1 year ago for UTIs and urge incontinence. Was started on Estrace vaginal cream and Gelnique at that time however fell and has been in rehab for several months after Right THR. She had been off Estrace cream 3 months and noted to have urethral prolapse when she was catheterized in rehab. Patient was referred to Dr Laurann Montana and was told culture/biopsy was benign. 01/03/13 cytology was negative.  Patient had UTIs in rehab however no UTIs since returned home.   Patient denies any bladder or urethral pain. Denies any gross hematuria. Daytime frequency is q3hours and no nocturia. She admits to occasional urge incontinence which is not bothersome to her.     The patient's COPD is stable.The patient's hypothyroidism is stable       Past Medical History   Diagnosis Date   ??? Anemia    ??? Arthritis    ??? Cardiomegaly      d/t age;  asymptomatic   ??? HH (hiatus hernia)    ??? Osteoarthritis    ??? Gastritis    ??? Anemia, pernicious      since childhood   ??? Hiatal hernia    ??? Chronic obstructive pulmonary disease      peer xray; asymptomatic; very mild/ no treatment   ??? GERD (gastroesophageal  reflux disease)    ??? Thyroid disease      hypothyroid   ??? 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 Prolapsed urethral mucosa      occasional UTI   ??? Depression    ??? Dementia      with short term memory loss-needs assist with ADL's     Past Surgical History   Procedure Laterality Date   ??? Hx endoscopy     ??? Hx tonsillectomy     ??? Hx appendectomy     ??? Hx orthopaedic       right hip replacement     Current Outpatient Prescriptions   Medication Sig Dispense Refill   ??? BD LUER-LOK SYRINGE 3 mL 25 x 1" syrg        ??? estradiol (ESTRACE) 0.01 % (0.1 mg/gram) vaginal cream 1 gram vaginally 3x/week  42.5 g  6   ??? cyanocobalamin (VITAMIN B12) 1,000 mcg/mL injection 1 mL by IntraMUSCular route every thirty (30) days.  3 Vial  5   ??? oxazepam (SERAX) 10 mg capsule Take 1 Cap by mouth nightly as needed for Sleep or Anxiety. Max Daily Amount: 10 mg.  30 Cap  4   ??? PARoxetine (PAXIL) 20 mg tablet Take 1 Tab by mouth daily.  90 Tab  3   ??? ergocalciferol (VITAMIN D2) 50,000 unit capsule Take 50,000 Units by mouth every thirty (30) days.       ??? pantoprazole (PROTONIX) 40 mg tablet Take 1 Tab by mouth daily.  90 Tab  4   ??? vitamin E (AQUA GEMS) 400 unit capsule Take  by mouth daily. Last dose 10/10/13       ??? loratadine (CLARITIN) 10 mg tablet Take 10 mg by mouth every morning.       ??? budesonide-formoterol (SYMBICORT) 160-4.5 mcg/actuation HFA inhaler Take 2 puffs by inhalation as needed. Does not use       ??? levothyroxine (SYNTHROID) 50 mcg tablet Take 1 Tab by mouth Daily (before breakfast).  90 Tab  3   ??? docusate sodium (COLACE) 100 mg capsule Take 100 mg by mouth every morning.       ??? calcium carbonate (CALTREX) 600 mg (1,500 mg) tablet Take 600 mg by mouth daily.       ??? MULTIVITAMIN WITH MINERALS (ONE-A-DAY 50 PLUS PO) Take  by mouth. Last dose 10/10/13       ??? oxazepam (SERAX) 15 mg capsule          No Known Allergies  History     Social History   ??? Marital Status: WIDOWED     Spouse Name: N/A     Number of  Children: N/A   ??? Years of Education: N/A     Occupational History   ??? Not on file.     Social History Main Topics   ??? Smoking status: Never Smoker    ??? Smokeless tobacco: Not on file   ??? Alcohol Use: Yes      Comment: social   ??? Drug Use: No   ??? Sexual Activity: No     Other Topics Concern   ??? Not on file     Social History Narrative     History reviewed. No pertinent family history.      Review of Systems  Constitutional:   Negative for fever and chills.  Genitourinary: Positive for nocturia, urgency, leakage w/ urge and frequent urination.       Some discharge  Musculoskeletal:  Negative for back pain and arthralgias.      Urinalysis  UA - Dipstick  Glucose - Negative  Bilirubin - Small  Ketones - Negative  Specific Gravity - 1.020  Blood - Negative  PH - 6.0  Protein - Trace  Urobilinogen - 0.2 E.U./DL  Nitrite - Negative  Leukocyte Esterase - Negative    UA - Micro  WBC - 0  RBC - 0  Bacteria - 0  Epith - 0    PHYSICAL EXAM    Filed Vitals:    12/18/13 1332   BP: 110/62   Pulse: 90   Resp: 18   Height: 5\' 4"  (1.626 m)   Weight: 147 lb 12.8 oz (67.042 kg)       General appearance - alert, well appearing, and in no distress  Mental status - alert, oriented to person, place, and time  Eyes - Bilaterally normal & regular  Nose - normal and patent, no erythema, discharge   Neck - supple, no significant adenopathy  Chest/Lung-  Quiet, even and easy respiratory effort without use of accessory muscles  Abdomen - soft, nontender, nondistended, no masses or organomegaly  Back exam - full  range of motion, no tenderness  Neurological - alert, oriented, normal speech, no focal findings or movement disorder noted on gross visual exam  Musculoskeletal - normal gait and station  Extremities - normal full range of motion of all extremities  Skin - normal coloration and turgor, no rashes    Urethra: soft & nontender urethral meatus mucosal prolapse. No induration noted.  Unchanged from 01/2013 exam.      PLAN  Rx given for  Estrace vaginal cream  Urethral mucosal prolapse appears unchanged from 01/2013  RTO in 62months or sooner if hematuria or UTI symptoms occur          Assessment and Plan    ICD-9-CM    1. Prolapse urethral mucosa 599.5 AMB POC URINALYSIS DIP STICK AUTO W/ MICRO     estradiol (ESTRACE) 0.01 % (0.1 mg/gram) vaginal cream       Follow-up Disposition:  Return in about 6 months (around 06/20/2014) for OV with Dr Tamala Julian.      Lita Mains, WHNP-BC  Time Spent with patient:  15 Minutes (>50% of this time was spent on counseling this patient about urethral prolapse, UTI)  Supervising Physician: Dr Tamala Julian

## 2014-01-12 MED ORDER — PREDNISONE 20 MG TAB
20 mg | ORAL_TABLET | ORAL | Status: DC
Start: 2014-01-12 — End: 2014-02-28

## 2014-01-12 NOTE — Progress Notes (Signed)
[facility]  Jannell Franta C. Posey Pronto, M.D.  Campbell Riches, M.D.  [place] [place]  Ph No:  [phone]  Fax:  [phone]      CHIEF COMPLAINT: skin no rash, itching on and off for last several months         HISTORY OF PRESENT ILLNESS: Jo Simmons is a 80 y.o. female with past medical history dementia, history of cardiomegaly, DJD, COPD, history of hip fracture.  She is seen in office today accompanied by her daughter stating that mother had issues with itching and skin rash initially thought that is was related to medication, but she has stopped most of her medication.  In spite of that she is having issues with itching.  She is also has not been sleeping well.  She lives in an apartment set up at nighttime and goes to her daughter's house during daytime.  Apartment superintendent had check for bedbug, but they have not been able to localize or found any in her apartment.      HISTORY:  No Known Allergies  Past Medical History   Diagnosis Date   ??? Anemia    ??? Arthritis    ??? Cardiomegaly      d/t age;  asymptomatic   ??? HH (hiatus hernia)    ??? Osteoarthritis    ??? Gastritis    ??? Anemia, pernicious      since childhood   ??? Hiatal hernia    ??? Chronic obstructive pulmonary disease (HCC)      peer xray; asymptomatic; very mild/ no treatment   ??? GERD (gastroesophageal reflux disease)    ??? Thyroid disease      hypothyroid   ??? 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 Depression    ??? Dementia      with short term memory loss-needs assist with ADL's     Past Surgical History   Procedure Laterality Date   ??? Hx endoscopy     ??? Hx tonsillectomy     ??? Hx appendectomy     ??? Hx orthopaedic       right hip replacement     No family history on file.  History     Social History   ??? Marital Status: WIDOWED     Spouse Name: N/A     Number of Children: N/A   ??? Years of Education: N/A      Occupational History   ??? Not on file.     Social History Main Topics   ??? Smoking status: Never Smoker    ??? Smokeless tobacco: Not on file   ??? Alcohol Use: Yes      Comment: social   ??? Drug Use: No   ??? Sexual Activity: No     Other Topics Concern   ??? Not on file     Social History Narrative     Current Outpatient Prescriptions   Medication Sig Dispense Refill   ??? diphenhydrAMINE (BENADRYL) 25 mg capsule Take 25 mg by mouth every six (6) hours as needed.       ??? predniSONE (DELTASONE) 20 mg tablet 1 tab TID  for 3 days than 1 Tab BID for 3 days than 1 Tab daily for 3 days than half  tab daily for 3 days than stop  18 Tab  0   ???  oxazepam (SERAX) 15 mg capsule        ??? BD LUER-LOK SYRINGE 3 mL 25 x 1" syrg        ??? estradiol (ESTRACE) 0.01 % (0.1 mg/gram) vaginal cream 1 gram vaginally 3x/week  42.5 g  6   ??? cyanocobalamin (VITAMIN B12) 1,000 mcg/mL injection 1 mL by IntraMUSCular route every thirty (30) days.  3 Vial  5   ??? oxazepam (SERAX) 10 mg capsule Take 1 Cap by mouth nightly as needed for Sleep or Anxiety. Max Daily Amount: 10 mg.  30 Cap  4   ??? PARoxetine (PAXIL) 20 mg tablet Take 1 Tab by mouth daily.  90 Tab  3   ??? ergocalciferol (VITAMIN D2) 50,000 unit capsule Take 50,000 Units by mouth every thirty (30) days.       ??? pantoprazole (PROTONIX) 40 mg tablet Take 1 Tab by mouth daily.  90 Tab  4   ??? vitamin E (AQUA GEMS) 400 unit capsule Take  by mouth daily. Last dose 10/10/13       ??? budesonide-formoterol (SYMBICORT) 160-4.5 mcg/actuation HFA inhaler Take 2 puffs by inhalation as needed. Does not use       ??? levothyroxine (SYNTHROID) 50 mcg tablet Take 1 Tab by mouth Daily (before breakfast).  90 Tab  3   ??? docusate sodium (COLACE) 100 mg capsule Take 100 mg by mouth every morning.       ??? calcium carbonate (CALTREX) 600 mg (1,500 mg) tablet Take 600 mg by mouth daily.       ??? MULTIVITAMIN WITH MINERALS (ONE-A-DAY 50 PLUS PO) Take  by mouth. Last dose 10/10/13           MEDICATIONS:  Current outpatient  prescriptions:diphenhydrAMINE (BENADRYL) 25 mg capsule, Take 25 mg by mouth every six (6) hours as needed., Disp: , Rfl: ;  predniSONE (DELTASONE) 20 mg tablet, 1 tab TID  for 3 days than 1 Tab BID for 3 days than 1 Tab daily for 3 days than half  tab daily for 3 days than stop, Disp: 18 Tab, Rfl: 0;  oxazepam (SERAX) 15 mg capsule, , Disp: , Rfl: ;  BD LUER-LOK SYRINGE 3 mL 25 x 1" syrg, , Disp: , Rfl:   estradiol (ESTRACE) 0.01 % (0.1 mg/gram) vaginal cream, 1 gram vaginally 3x/week, Disp: 42.5 g, Rfl: 6;  cyanocobalamin (VITAMIN B12) 1,000 mcg/mL injection, 1 mL by IntraMUSCular route every thirty (30) days., Disp: 3 Vial, Rfl: 5;  oxazepam (SERAX) 10 mg capsule, Take 1 Cap by mouth nightly as needed for Sleep or Anxiety. Max Daily Amount: 10 mg., Disp: 30 Cap, Rfl: 4  PARoxetine (PAXIL) 20 mg tablet, Take 1 Tab by mouth daily., Disp: 90 Tab, Rfl: 3;  ergocalciferol (VITAMIN D2) 50,000 unit capsule, Take 50,000 Units by mouth every thirty (30) days., Disp: , Rfl: ;  pantoprazole (PROTONIX) 40 mg tablet, Take 1 Tab by mouth daily., Disp: 90 Tab, Rfl: 4;  vitamin E (AQUA GEMS) 400 unit capsule, Take  by mouth daily. Last dose 10/10/13, Disp: , Rfl:   budesonide-formoterol (SYMBICORT) 160-4.5 mcg/actuation HFA inhaler, Take 2 puffs by inhalation as needed. Does not use, Disp: , Rfl: ;  levothyroxine (SYNTHROID) 50 mcg tablet, Take 1 Tab by mouth Daily (before breakfast)., Disp: 90 Tab, Rfl: 3;  docusate sodium (COLACE) 100 mg capsule, Take 100 mg by mouth every morning., Disp: , Rfl: ;  calcium carbonate (CALTREX) 600 mg (1,500 mg) tablet, Take 600 mg by mouth  daily., Disp: , Rfl:   MULTIVITAMIN WITH MINERALS (ONE-A-DAY 50 PLUS PO), Take  by mouth. Last dose 10/10/13, Disp: , Rfl:       REVIEW OF SYSTEMS:  GENERAL/CONSTITUTIONAL: negative  HEAD, EYES, EARS, NOSE AND THROAT: negative  CARDIOVASCULAR: negative  RESPIRATORY:  negative   GASTROINTESTINAL: negative  GENITOURINARY: negative  MUSCULOSKELETAL: negative   BREASTS: negative  SKIN:  Positive for skin rash  NEUROLOGIC: negative  PSYCHIATRIC: negative  ENDOCRINE:  negative  HEMATOLOGIC/LYMPHATIC:  negative  ALLERGIC/IMMUNOLOGIC:  negative        PHYSICAL EXAM  Vital Signs - BP 147/90   Ht 5\' 4"  (1.626 m)   Wt 152 lb (68.947 kg)   BMI 26.08 kg/m2   Constitutional - alert, well appearing, and in no distress.  Eyes - pupils equal and reactive, extraocular eye movements intact.  Ear, Nose, Mouth, Throat - normal  Examination of oropharynx: oral mucosa moist  Neck - supple, no significant adenopathy.   Respiratory - clear to auscultation, no wheezes, rales or bronchi, symmetric air entry.  Cardiovascular - normal rate, regular rhythm, normal S1, S2, systolic murmur    Gastrointestinal - Abdomen soft, nontender, nondistended, no masses.  Back exam - full range of motion, no tenderness, palpable spasm or pain on motion  Musculoskeletal -  No joint tenderness, deformity or swelling.  Skin -skin rash noted in antecubital area  Neurological - Cranial nerves with notation of any deficits. Motor and sensory exam is intact   Extremities - peripheral pulses normal, no pedal edema, no clubbing or cyanosis  Psychiatric - alert, oriented to person, place, and time. anxious    LABS  Results for orders placed in visit on 12/18/13   AMB POC URINALYSIS DIP STICK AUTO W/ MICRO       Result Value Ref Range    Color (UA POC)        Clarity (UA POC)        Glucose (UA POC)    Negative    Bilirubin (UA POC)    Negative    Ketones (UA POC)    Negative    Specific gravity (UA POC)    1.001 - 1.035    Blood (UA POC)    Negative    pH (UA POC)    4.6 - 8.0    Protein (UA POC)    Negative mg/dL    Urobilinogen (UA POC)    0.2 - 1    Nitrites (UA POC)    Negative    Leukocyte esterase (UA POC)    Negative             IMPRESSION:    ICD-9-CM    1. Skin rash 782.1 REFERRAL TO DERMATOLOGY   2. Unspecified hypothyroidism 244.9    3. Depression 311    4. Gastroesophageal reflux disease without esophagitis  530.81           PLAN:  I discussed with patient  .  I have started her prednisone tapering doses.  I also advised her to have dermatology consultation.  She is advised to continue monitor her symptoms stay with a low-salt, low-cholesterol diet.  Follow-up is recommended.  See orders.    Jo Jarred, MD          Dictated using voice recognition software. Proofread, but unrecognized voice recognition errors may exist.

## 2014-02-21 ENCOUNTER — Ambulatory Visit: Payer: Self-pay | Admitting: Family Medicine

## 2014-02-21 LAB — URINALYSIS, COMPLETE
Bilirubin,UR: NEGATIVE
Glucose,UR: NEGATIVE mg/dL (ref 0–75)
Ketone: NEGATIVE
Nitrite: NEGATIVE
PH: 7 (ref 4.5–8.0)
Protein: 100
Specific Gravity: 1.015 (ref 1.003–1.030)
WBC UR: >30 /HPF (ref 0–5)

## 2014-02-25 MED ORDER — PAROXETINE 20 MG TAB
20 mg | ORAL_TABLET | Freq: Every day | ORAL | Status: DC
Start: 2014-02-25 — End: 2015-02-05

## 2014-02-26 ENCOUNTER — Inpatient Hospital Stay
Admit: 2014-02-26 | Discharge: 2014-02-28 | Disposition: A | Discharge status: Home / Self Care | Payer: MEDICARE | Attending: Gastroenterology | Admitting: Gastroenterology

## 2014-02-26 DIAGNOSIS — K8051 Calculus of bile duct without cholangitis or cholecystitis with obstruction: Secondary | ICD-10-CM

## 2014-02-26 LAB — METABOLIC PANEL, COMPREHENSIVE
A-G Ratio: 1 — ABNORMAL LOW (ref 1.2–3.5)
ALT (SGPT): 311 U/L — ABNORMAL HIGH (ref 12–65)
AST (SGOT): 237 U/L — ABNORMAL HIGH (ref 15–37)
Albumin: 3.8 g/dL (ref 3.2–4.6)
Alk. phosphatase: 405 U/L — ABNORMAL HIGH (ref 50–136)
Anion gap: 15 mmol/L (ref 7–16)
BUN: 12 MG/DL (ref 8–23)
Bilirubin, total: 6.4 MG/DL — ABNORMAL HIGH (ref 0.2–1.1)
CO2: 16 mmol/L — ABNORMAL LOW (ref 21–32)
Calcium: 9.5 MG/DL (ref 8.3–10.4)
Chloride: 107 mmol/L (ref 98–107)
Creatinine: 1.37 MG/DL — ABNORMAL HIGH (ref 0.6–1.0)
GFR est AA: 47 mL/min/{1.73_m2} — ABNORMAL LOW (ref >60)
GFR est non-AA: 39 mL/min/{1.73_m2} — ABNORMAL LOW (ref >60)
Globulin: 3.7 g/dL — ABNORMAL HIGH (ref 2.3–3.5)
Glucose: 124 mg/dL — ABNORMAL HIGH (ref 65–100)
Potassium: 3.7 mmol/L (ref 3.5–5.1)
Protein, total: 7.5 g/dL (ref 6.3–8.2)
Sodium: 138 mmol/L (ref 136–145)

## 2014-02-26 LAB — CBC WITH AUTOMATED DIFF
ABS. BASOPHILS: 0 10*3/uL (ref 0.0–0.2)
ABS. EOSINOPHILS: 0 10*3/uL (ref 0.0–0.8)
ABS. IMM. GRANS.: 0.2 10*3/uL (ref 0.0–0.5)
ABS. LYMPHOCYTES: 1.3 10*3/uL (ref 0.5–4.6)
ABS. MONOCYTES: 0.6 10*3/uL (ref 0.1–1.3)
ABS. NEUTROPHILS: 2.5 10*3/uL (ref 1.7–8.2)
BASOPHILS: 0 % (ref 0.0–2.0)
EOSINOPHILS: 1 % (ref 0.5–7.8)
HCT: 42.1 % (ref 35.8–46.3)
HGB: 14.8 g/dL (ref 11.7–15.4)
IMMATURE GRANULOCYTES: 4.2 % (ref 0.0–5.0)
LYMPHOCYTES: 28 % (ref 13–44)
MCH: 28.3 PG (ref 26.1–32.9)
MCHC: 35.2 g/dL — ABNORMAL HIGH (ref 31.4–35.0)
MCV: 80.5 FL (ref 79.6–97.8)
MONOCYTES: 13 % — ABNORMAL HIGH (ref 4.0–12.0)
MPV: 9.3 FL — ABNORMAL LOW (ref 10.8–14.1)
NEUTROPHILS: 54 % (ref 43–78)
PLATELET: 258 10*3/uL (ref 150–450)
RBC: 5.23 M/uL (ref 4.05–5.25)
RDW: 14.5 % (ref 11.9–14.6)
WBC: 4.6 10*3/uL (ref 4.3–11.1)

## 2014-02-26 LAB — LIPASE: Lipase: 446 U/L — ABNORMAL HIGH (ref 73–393)

## 2014-02-26 MED ORDER — ONDANSETRON (PF) 4 MG/2 ML INJECTION
4 mg/2 mL | INTRAMUSCULAR | Status: AC
Start: 2014-02-26 — End: 2014-02-26
  Administered 2014-02-26: via INTRAVENOUS

## 2014-02-26 MED ORDER — KETOROLAC TROMETHAMINE 30 MG/ML INJECTION
30 mg/mL (1 mL) | INTRAMUSCULAR | Status: AC
Start: 2014-02-26 — End: 2014-02-26
  Administered 2014-02-26: via INTRAVENOUS

## 2014-02-26 MED ORDER — FAMOTIDINE (PF) 20 MG/2 ML IV
20 mg/2 mL | INTRAVENOUS | Status: AC
Start: 2014-02-26 — End: 2014-02-26
  Administered 2014-02-26: via INTRAVENOUS

## 2014-02-26 MED ORDER — SODIUM CHLORIDE 0.9 % IJ SYRG
Freq: Three times a day (TID) | INTRAMUSCULAR | Status: DC
Start: 2014-02-26 — End: 2014-02-28
  Administered 2014-02-27 – 2014-02-28 (×4): via INTRAVENOUS

## 2014-02-26 MED ORDER — SODIUM CHLORIDE 0.9 % IJ SYRG
INTRAMUSCULAR | Status: DC | PRN
Start: 2014-02-26 — End: 2014-02-27

## 2014-02-26 MED ORDER — SODIUM CHLORIDE 0.9% BOLUS IV
0.9 % | Freq: Once | INTRAVENOUS | Status: AC
Start: 2014-02-26 — End: 2014-02-26
  Administered 2014-02-26: via INTRAVENOUS

## 2014-02-26 MED FILL — ONDANSETRON (PF) 4 MG/2 ML INJECTION: 4 mg/2 mL | INTRAMUSCULAR | Qty: 2

## 2014-02-26 MED FILL — KETOROLAC TROMETHAMINE 30 MG/ML INJECTION: 30 mg/mL (1 mL) | INTRAMUSCULAR | Qty: 1

## 2014-02-26 MED FILL — FAMOTIDINE (PF) 20 MG/2 ML IV: 20 mg/2 mL | INTRAVENOUS | Qty: 2

## 2014-02-26 NOTE — ED Provider Notes (Signed)
HPI Comments: Since about last Friday the patient has had recurring episodes of abdominal pain.  Work.  She felt as though if she vomited it would make her pain improved.  See any blood in the emesis.  She went to an urgent care they told her that they thought she had a urinary tract infection and she is placed on antibiotics.  She does not feel as though this changed any of her symptoms.  He has no known gallbladder disease and she Does not relate any specific food provokers.  This evening the daughter noticed what appeared to be some scleral icterus in her mother.there is no abdominal distention.  She has had no melena or evidence of lower GI bleeding.she has had no fevers or chills.    Patient is a 80 y.o. female presenting with abdominal pain. The history is provided by the patient (daughter).   Abdominal Pain   This is a new problem. The problem occurs constantly. The problem has not changed since onset.The pain is associated with an unknown factor. The pain is located in the RUQ. The pain is moderate. Pertinent negatives include no fever, no diarrhea, no nausea, no vomiting, no constipation, no dysuria, no frequency and no back pain. Past workup includes no CT scan. Her past medical history is significant for UTI. Her past medical history does not include PUD, pancreatitis, diverticulitis or DM.        Past Medical History   Diagnosis Date   ??? Anemia    ??? Arthritis    ??? Cardiomegaly      d/t age;  asymptomatic   ??? HH (hiatus hernia)    ??? Osteoarthritis    ??? Gastritis    ??? Anemia, pernicious      since childhood   ??? Hiatal hernia    ??? Chronic obstructive pulmonary disease (HCC)      peer xray; asymptomatic; very mild/ no treatment   ??? GERD (gastroesophageal reflux disease)    ??? Thyroid disease      hypothyroid   ??? 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 Depression    ??? Dementia      with short term memory loss-needs assist with ADL's         Past Surgical History   Procedure Laterality Date   ??? Hx endoscopy     ??? Hx tonsillectomy     ??? Hx appendectomy     ??? Hx orthopaedic       right hip replacement         History reviewed. No pertinent family history.     History     Social History   ??? Marital Status: WIDOWED     Spouse Name: N/A     Number of Children: N/A   ??? Years of Education: N/A     Occupational History   ??? Not on file.     Social History Main Topics   ??? Smoking status: Never Smoker    ??? Smokeless tobacco: Not on file   ??? Alcohol Use: Yes      Comment: social   ??? Drug Use: No   ??? Sexual Activity: No     Other Topics Concern   ??? Not on file     Social History Narrative  ALLERGIES: Review of patient's allergies indicates no known allergies.      Review of Systems   Constitutional: Negative for fever and chills.   HENT: Negative.    Respiratory: Negative.    Cardiovascular: Negative.    Gastrointestinal: Positive for abdominal pain. Negative for nausea, vomiting, diarrhea, constipation and abdominal distention.   Genitourinary: Negative for dysuria and frequency.   Musculoskeletal: Negative.  Negative for back pain.   Skin: Negative.    Psychiatric/Behavioral: Negative.    All other systems reviewed and are negative.      Filed Vitals:    02/26/14 1900 02/26/14 1920 02/26/14 1940 02/26/14 1941   BP: 168/83 166/100 204/93    Pulse: 85 92 73    Temp:       Resp:       Height:       Weight:       SpO2: 93% 95%  96%            Physical Exam   Constitutional: She appears well-developed and well-nourished.   Recurring vomiting   HENT:   Head: Atraumatic.   Mouth/Throat: Oropharynx is clear and moist.   Eyes: Conjunctivae are normal. Scleral icterus is present.   Neck: Neck supple.   Cardiovascular: Normal rate, regular rhythm and normal heart sounds.    Pulmonary/Chest: Effort normal. No respiratory distress. She has no wheezes.   Abdominal: She exhibits no distension. There is tenderness. There is no rebound and no guarding.   +/-  Murphy's   no CVAT  No peritoneal signs   Musculoskeletal: Normal range of motion.   Neurological: She is alert.   Skin: Skin is warm and dry. She is not diaphoretic.   Psychiatric: Her behavior is normal. Thought content normal.   Nursing note and vitals reviewed.       MDM  Number of Diagnoses or Management Options  Diagnosis management comments: Acute right upper quadrant pain.  She has sludge within her gallbladder.  There is dilatation of the duct without entirety of the common bile duct being seen.  He has an elevated bilirubin with other elevations in LFTs.  She will need ERCP or similar type studies to initiate evaluation of this.  Discussed this with Dr. Jarold Song who is admitting her       Amount and/or Complexity of Data Reviewed  Clinical lab tests: ordered and reviewed  Tests in the radiology section of CPT??: reviewed and ordered  Obtain history from someone other than the patient: yes (A daughter)  Discuss the patient with other providers: yes (Discussed with Dr Jarold Song)  Independent visualization of images, tracings, or specimens: yes    Risk of Complications, Morbidity, and/or Mortality  Diagnostic procedures: moderate  Management options: high    Critical Care  Total time providing critical care: 30-74 minutes      Procedures    Recent Results (from the past 12 hour(s))   CBC WITH AUTOMATED DIFF    Collection Time     02/26/14  5:34 PM       Result Value Ref Range    WBC 4.6  4.3 - 11.1 K/uL    RBC 5.23  4.05 - 5.25 M/uL    HGB 14.8  11.7 - 15.4 g/dL    HCT 42.1  35.8 - 46.3 %    MCV 80.5  79.6 - 97.8 FL    MCH 28.3  26.1 - 32.9 PG    MCHC 35.2 (*) 31.4 - 35.0 g/dL  RDW 14.5  11.9 - 14.6 %    PLATELET 258  150 - 450 K/uL    MPV 9.3 (*) 10.8 - 14.1 FL    DF AUTOMATED      NEUTROPHILS 54  43 - 78 %    LYMPHOCYTES 28  13 - 44 %    MONOCYTES 13 (*) 4.0 - 12.0 %    EOSINOPHILS 1  0.5 - 7.8 %    BASOPHILS 0  0.0 - 2.0 %    IMMATURE GRANULOCYTES 4.2  0.0 - 5.0 %    ABS. NEUTROPHILS 2.5  1.7 - 8.2 K/UL    ABS.  LYMPHOCYTES 1.3  0.5 - 4.6 K/UL    ABS. MONOCYTES 0.6  0.1 - 1.3 K/UL    ABS. EOSINOPHILS 0.0  0.0 - 0.8 K/UL    ABS. BASOPHILS 0.0  0.0 - 0.2 K/UL    ABS. IMM. GRANS. 0.2  0.0 - 0.5 K/UL   METABOLIC PANEL, COMPREHENSIVE    Collection Time     02/26/14  5:34 PM       Result Value Ref Range    Sodium 138  136 - 145 mmol/L    Potassium 3.7  3.5 - 5.1 mmol/L    Chloride 107  98 - 107 mmol/L    CO2 16 (*) 21 - 32 mmol/L    Anion gap 15  7 - 16 mmol/L    Glucose 124 (*) 65 - 100 mg/dL    BUN 12  8 - 23 MG/DL    Creatinine 1.37 (*) 0.6 - 1.0 MG/DL    GFR est AA 47 (*) >60 ml/min/1.61m    GFR est non-AA 39 (*) >60 ml/min/1.754m   Calcium 9.5  8.3 - 10.4 MG/DL    Bilirubin, total 6.4 (*) 0.2 - 1.1 MG/DL    ALT 311 (*) 12 - 65 U/L    AST 237 (*) 15 - 37 U/L    Alk. phosphatase 405 (*) 50 - 136 U/L    Protein, total 7.5  6.3 - 8.2 g/dL    Albumin 3.8  3.2 - 4.6 g/dL    Globulin 3.7 (*) 2.3 - 3.5 g/dL    A-G Ratio 1.0 (*) 1.2 - 3.5     LIPASE    Collection Time     02/26/14  5:34 PM       Result Value Ref Range    Lipase 446 (*) 73 - 393 U/L      [place] L[employer]   Final Result   Abdominal ultrasound, 02/26/2014       History: Right upper quadrant pain, nausea, vomiting, and jaundice.        Comparison:  None.        Findings: The liver is normal in size measuring 13.8 cm. Intrahepatic    biliary   dilation is seen.  Liver echogenicity is normal.        The gallbladder contains no shadowing gallstones although echogenic sludge    is   seen.  The gallbladder has a hypoechoic appearance. The wall is not    thickened.    The common bile duct is dilated measuring 15 mm.  A sonographic MuPercell Miller  sign is   present.      The pancreas is partially visualized.  The partially visualized pancreatic    head,   and body demonstrates no gross abnormality.  The remainder of the pancreas    is   non-visualized and  therefore cannot be evaluated.      The right kidney measures 10.3 cm in length.  No shadowing stones, or   hydronephrosis is seen of  the right kidney.      The IVC is patent. The distal abdominal aorta is normal in caliber    measuring 1.8   cm. The non-visualized portions of the aorta cannot be evaluated.      IMPRESSION:    1.  Hydropic appearance of the gallbladder, and positive sonographic    Murphy   sign. Cholecystitis is not excluded.      2.  Intra-and extra hepatic biliary ductal dilation of uncertain etiology    given   the lack of visualized stones within the gallbladder. The pancreatic head    is   only partially visualized the most distal aspect of the common bile duct    is not   visualized.

## 2014-02-26 NOTE — ED Notes (Signed)
Report to Missy RN.

## 2014-02-26 NOTE — Progress Notes (Signed)
[facility]  Jo Simmons C. Posey Pronto, M.D.  Jo [facility], M.D.  Jo Simmons, Jo Simmons  Ph No:  [phone]  Fax:  [phone]      CHIEF COMPLAINT: follow-up visit//recent UTI         HISTORY OF PRESENT ILLNESS: Jo Simmons is a 80 y.o. female with past medical history positive for COPD, history of DJD, history of hypothyroidism, history of dementia.  She is seen in office today accompanied by her daughter stating that mother has not been doing very well.  She had problem with her memory loss and her dementia has been getting worse on top of that she recently lost her daughter and that has really stressed her out.  She had been to urgent care [facility] for urinary tract infection.  They have started her on Bactrim DS 1 tablet twice daily.  She is also complaining of having pain in her epigastric area.  She also had an episode of vomiting and diarrhea as well as nauseous feeling.  Daughter is poor and worried about that.  She also feels her mother has been jaundice.      HISTORY:  No Known Allergies  Past Medical History   Diagnosis Date   ??? Anemia    ??? Arthritis    ??? Cardiomegaly      d/t age;  asymptomatic   ??? HH (hiatus hernia)    ??? Osteoarthritis    ??? Gastritis    ??? Anemia, pernicious      since childhood   ??? Hiatal hernia    ??? Chronic obstructive pulmonary disease (HCC)      peer xray; asymptomatic; very mild/ no treatment   ??? GERD (gastroesophageal reflux disease)    ??? Thyroid disease      hypothyroid   ??? 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 Depression    ??? Dementia      with short term memory loss-needs assist with ADL's     Past Surgical History   Procedure Laterality Date   ??? Hx endoscopy     ??? Hx tonsillectomy     ??? Hx appendectomy     ??? Hx orthopaedic       right hip replacement     No family history on file.  History     Social History   ??? Marital Status:  WIDOWED     Spouse Name: N/A     Number of Children: N/A   ??? Years of Education: N/A     Occupational History   ??? Not on file.     Social History Main Topics   ??? Smoking status: Never Smoker    ??? Smokeless tobacco: Not on file   ??? Alcohol Use: Yes      Comment: social   ??? Drug Use: No   ??? Sexual Activity: No     Other Topics Concern   ??? Not on file     Social History Narrative     Current Outpatient Prescriptions   Medication Sig Dispense Refill   ??? PARoxetine (PAXIL) 20 mg tablet Take 1 Tab by mouth daily.  90 Tab  3   ??? diphenhydrAMINE (BENADRYL) 25 mg capsule Take 25 mg by mouth every six (6) hours as needed.       ??? predniSONE (DELTASONE) 20 mg  tablet 1 tab TID  for 3 days than 1 Tab BID for 3 days than 1 Tab daily for 3 days than half  tab daily for 3 days than stop  18 Tab  0   ??? oxazepam (SERAX) 15 mg capsule        ??? BD LUER-LOK SYRINGE 3 mL 25 x 1" syrg        ??? estradiol (ESTRACE) 0.01 % (0.1 mg/gram) vaginal cream 1 gram vaginally 3x/week  42.5 g  6   ??? cyanocobalamin (VITAMIN B12) 1,000 mcg/mL injection 1 mL by IntraMUSCular route every thirty (30) days.  3 Vial  5   ??? oxazepam (SERAX) 10 mg capsule Take 1 Cap by mouth nightly as needed for Sleep or Anxiety. Max Daily Amount: 10 mg.  30 Cap  4   ??? ergocalciferol (VITAMIN D2) 50,000 unit capsule Take 50,000 Units by mouth every thirty (30) days.       ??? pantoprazole (PROTONIX) 40 mg tablet Take 1 Tab by mouth daily.  90 Tab  4   ??? vitamin E (AQUA GEMS) 400 unit capsule Take  by mouth daily. Last dose 10/10/13       ??? budesonide-formoterol (SYMBICORT) 160-4.5 mcg/actuation HFA inhaler Take 2 puffs by inhalation as needed. Does not use       ??? levothyroxine (SYNTHROID) 50 mcg tablet Take 1 Tab by mouth Daily (before breakfast).  90 Tab  3   ??? docusate sodium (COLACE) 100 mg capsule Take 100 mg by mouth every morning.       ??? calcium carbonate (CALTREX) 600 mg (1,500 mg) tablet Take 600 mg by mouth daily.       ??? MULTIVITAMIN WITH MINERALS (ONE-A-DAY 50 PLUS  PO) Take  by mouth. Last dose 10/10/13         Facility-Administered Medications Ordered in Other Visits   Medication Dose Route Frequency Provider Last Rate Last Dose   ??? sodium chloride (NS) flush 5-10 mL  5-10 mL IntraVENous Q8H G Lianne Moris, MD       ??? sodium chloride (NS) flush 5-10 mL  5-10 mL IntraVENous PRN Barnet Glasgow, MD           MEDICATIONS:  No current facility-administered medications for this visit.  Current outpatient prescriptions:PARoxetine (PAXIL) 20 mg tablet, Take 1 Tab by mouth daily., Disp: 90 Tab, Rfl: 3;  diphenhydrAMINE (BENADRYL) 25 mg capsule, Take 25 mg by mouth every six (6) hours as needed., Disp: , Rfl: ;  predniSONE (DELTASONE) 20 mg tablet, 1 tab TID  for 3 days than 1 Tab BID for 3 days than 1 Tab daily for 3 days than half  tab daily for 3 days than stop, Disp: 18 Tab, Rfl: 0;  oxazepam (SERAX) 15 mg capsule, , Disp: , Rfl:   BD LUER-LOK SYRINGE 3 mL 25 x 1" syrg, , Disp: , Rfl: ;  estradiol (ESTRACE) 0.01 % (0.1 mg/gram) vaginal cream, 1 gram vaginally 3x/week, Disp: 42.5 g, Rfl: 6;  cyanocobalamin (VITAMIN B12) 1,000 mcg/mL injection, 1 mL by IntraMUSCular route every thirty (30) days., Disp: 3 Vial, Rfl: 5;  oxazepam (SERAX) 10 mg capsule, Take 1 Cap by mouth nightly as needed for Sleep or Anxiety. Max Daily Amount: 10 mg., Disp: 30 Cap, Rfl: 4  ergocalciferol (VITAMIN D2) 50,000 unit capsule, Take 50,000 Units by mouth every thirty (30) days., Disp: , Rfl: ;  pantoprazole (PROTONIX) 40 mg tablet, Take 1 Tab by mouth daily., Disp: 90 Tab,  Rfl: 4;  vitamin E (AQUA GEMS) 400 unit capsule, Take  by mouth daily. Last dose 10/10/13, Disp: , Rfl: ;  budesonide-formoterol (SYMBICORT) 160-4.5 mcg/actuation HFA inhaler, Take 2 puffs by inhalation as needed. Does not use, Disp: , Rfl:   levothyroxine (SYNTHROID) 50 mcg tablet, Take 1 Tab by mouth Daily (before breakfast)., Disp: 90 Tab, Rfl: 3;  docusate sodium (COLACE) 100 mg capsule, Take 100 mg by mouth every morning., Disp:  , Rfl: ;  calcium carbonate (CALTREX) 600 mg (1,500 mg) tablet, Take 600 mg by mouth daily., Disp: , Rfl: ;  MULTIVITAMIN WITH MINERALS (ONE-A-DAY 50 PLUS PO), Take  by mouth. Last dose 10/10/13, Disp: , Rfl:   Facility-Administered Medications Ordered in Other Visits: sodium chloride (NS) flush 5-10 mL, 5-10 mL, IntraVENous, Q8H, G Lianne Moris, MD;  sodium chloride (NS) flush 5-10 mL, 5-10 mL, IntraVENous, PRN, Barnet Glasgow, MD      REVIEW OF SYSTEMS:  GENERAL/CONSTITUTIONAL: negative  HEAD, EYES, EARS, NOSE AND THROAT: negative  CARDIOVASCULAR: negative  RESPIRATORY:  negative   GASTROINTESTINAL: vomiting, nausea, diarrhea, jaundice  GENITOURINARY: recent UTI  MUSCULOSKELETAL: negative  BREASTS: negative  SKIN:  negative  NEUROLOGIC: negative  PSYCHIATRIC: negative  ENDOCRINE:  negative  HEMATOLOGIC/LYMPHATIC:  negative  ALLERGIC/IMMUNOLOGIC:  negative        PHYSICAL EXAM  Vital Signs - Ht 5\' 4"  (1.626 m)   Wt 147 lb (66.679 kg)   BMI 25.22 kg/m2   Constitutional - alert, well appearing, and in no distress.  Eyes - pupils equal and reactive, extraocular eye movements intact. Sclera appear to be icteric  Ear, Nose, Mouth, Throat - palate appeared to show jaundice  Examination of oropharynx: oral mucosa jjaundice  Neck - supple, no significant adenopathy.   Respiratory - clear to auscultation, no wheezes, rales or bronchi, symmetric air entry.  Cardiovascular - normal rate, regular rhythm, normal S1, S2,       Gastrointestinal - Abdomen soft, liver is palpable below right costal margin.  Nonspecific tenderness noted in epigastric area.  Back exam -DJD changes noted  Musculoskeletal -  DJD changes noted  Skin - jaundice noted  Neurological - Cranial nerves with notation of any deficits. Motor and sensory exam is intact   Extremities - peripheral pulses normal, no pedal edema, no clubbing or cyanosis  Psychiatric - alert, oriented to person, place, and time. Anxious, nervous    LABS  Results for orders placed  in visit on 12/18/13   AMB POC URINALYSIS DIP STICK AUTO W/ MICRO       Result Value Ref Range    Color (UA POC)        Clarity (UA POC)        Glucose (UA POC)    Negative    Bilirubin (UA POC)    Negative    Ketones (UA POC)    Negative    Specific gravity (UA POC)    1.001 - 1.035    Blood (UA POC)    Negative    pH (UA POC)    4.6 - 8.0    Protein (UA POC)    Negative mg/dL    Urobilinogen (UA POC)    0.2 - 1    Nitrites (UA POC)    Negative    Leukocyte esterase (UA POC)    Negative             IMPRESSION:    ICD-9-CM   1. Jaundice 782.4   2. Nausea  787.02   3. Abdominal pain, unspecified abdominal location 789.00   4. COPD (chronic obstructive pulmonary disease) ([place]) 496          PLAN:  I discussed with patient and reviewed the finding best option.  I have discussed with daughter regarding her condition and the best option will be to send her to the emergency room and have further workup and decide admission.  Follow-up was recommended.  See orders.    Zenovia Jarred, MD          Dictated using voice recognition software. Proofread, but unrecognized voice recognition errors may exist.

## 2014-02-26 NOTE — ED Notes (Addendum)
C/o abdominal pain RLQ since last Friday, nausea and vomiting. No subjective fever reported. History of appendectomy

## 2014-02-26 NOTE — H&P (Signed)
[facility] H&P (Admission)        Date:  02/26/2014    Chief Complaint:  Nausea, Vomiting    Subjective:     History of Present Illness:  Patient is a 80 y.o. female patient of Dr Juanda Chance with PMH of gastric carcinoids, who is being admitted for nausea vomiting and biliary obstruction.  She has a several week history of intermittent episodes of post-prandial RUQ pain associated with nausea and vomiting.  Recently she has noted dark urine and jaundiced eues over the past week.  She denies fever or shaking chills, currently she has moderate eoigastric pain associated with nausea.  LFTs demonstrate a bili of 6 with AST/ALT in the 300 range.    PMH:  Past Medical History   Diagnosis Date   ??? Anemia    ??? Arthritis    ??? Cardiomegaly      d/t age;  asymptomatic   ??? HH (hiatus hernia)    ??? Osteoarthritis    ??? Gastritis    ??? Anemia, pernicious      since childhood   ??? Hiatal hernia    ??? Chronic obstructive pulmonary disease (HCC)      peer xray; asymptomatic; very mild/ no treatment   ??? GERD (gastroesophageal reflux disease)    ??? Thyroid disease      hypothyroid   ??? 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 Depression    ??? Dementia      with short term memory loss-needs assist with ADL's       PSH:  Past Surgical History   Procedure Laterality Date   ??? Hx endoscopy     ??? Hx tonsillectomy     ??? Hx appendectomy     ??? Hx orthopaedic       right hip replacement       Allergies:  No Known Allergies    Home Medications:  Prior to Admission medications    Medication Sig Start Date End Date Taking? Authorizing Provider   PARoxetine (PAXIL) 20 mg tablet Take 1 Tab by mouth daily. 02/25/14  Yes Zenovia Jarred, MD   diphenhydrAMINE (BENADRYL) 25 mg capsule Take 25 mg by mouth every six (6) hours as needed.   Yes Historical Provider   predniSONE (DELTASONE) 20 mg tablet 1 tab TID  for 3 days than 1 Tab BID for 3 days than 1 Tab daily for 3 days  than half  tab daily for 3 days than stop 01/12/14  Yes Zenovia Jarred, MD   oxazepam (SERAX) 15 mg capsule  10/27/13  Yes Historical Provider   BD LUER-LOK SYRINGE 3 mL 25 x 1" syrg  12/03/13  Yes Historical Provider   estradiol (ESTRACE) 0.01 % (0.1 mg/gram) vaginal cream 1 gram vaginally 3x/week 12/18/13  Yes Lucilla Edin, NP   cyanocobalamin (VITAMIN B12) 1,000 mcg/mL injection 1 mL by IntraMUSCular route every thirty (30) days. 12/03/13  Yes Zenovia Jarred, MD   oxazepam (SERAX) 10 mg capsule Take 1 Cap by mouth nightly as needed for Sleep or Anxiety. Max Daily Amount: 10 mg. 12/02/13  Yes Zenovia Jarred, MD   ergocalciferol (VITAMIN D2) 50,000 unit capsule Take 50,000 Units by mouth every thirty (30) days.   Yes Historical Provider   pantoprazole (PROTONIX) 40 mg tablet Take 1 Tab by mouth daily. 11/25/13  Yes [place],  MD   vitamin E (AQUA GEMS) 400 unit capsule Take  by mouth daily. Last dose 10/10/13   Yes Historical Provider   budesonide-formoterol (SYMBICORT) 160-4.5 mcg/actuation HFA inhaler Take 2 puffs by inhalation as needed. Does not use   Yes Historical Provider   levothyroxine (SYNTHROID) 50 mcg tablet Take 1 Tab by mouth Daily (before breakfast). 04/07/13  Yes Zenovia Jarred, MD   docusate sodium (COLACE) 100 mg capsule Take 100 mg by mouth every morning.   Yes Historical Provider   calcium carbonate (CALTREX) 600 mg (1,500 mg) tablet Take 600 mg by mouth daily.   Yes Historical Provider   MULTIVITAMIN WITH MINERALS (ONE-A-DAY 50 PLUS PO) Take  by mouth. Last dose 10/10/13   Yes Historical Provider       Hospital Medications:  Current Facility-Administered Medications   Medication Dose Route Frequency   ??? sodium chloride (NS) flush 5-10 mL  5-10 mL IntraVENous Q8H   ??? sodium chloride (NS) flush 5-10 mL  5-10 mL IntraVENous PRN     Current Outpatient Prescriptions   Medication Sig   ??? PARoxetine (PAXIL) 20 mg tablet Take 1 Tab by mouth daily.   ??? diphenhydrAMINE (BENADRYL) 25  mg capsule Take 25 mg by mouth every six (6) hours as needed.   ??? predniSONE (DELTASONE) 20 mg tablet 1 tab TID  for 3 days than 1 Tab BID for 3 days than 1 Tab daily for 3 days than half  tab daily for 3 days than stop   ??? oxazepam (SERAX) 15 mg capsule    ??? BD LUER-LOK SYRINGE 3 mL 25 x 1" syrg    ??? estradiol (ESTRACE) 0.01 % (0.1 mg/gram) vaginal cream 1 gram vaginally 3x/week   ??? cyanocobalamin (VITAMIN B12) 1,000 mcg/mL injection 1 mL by IntraMUSCular route every thirty (30) days.   ??? oxazepam (SERAX) 10 mg capsule Take 1 Cap by mouth nightly as needed for Sleep or Anxiety. Max Daily Amount: 10 mg.   ??? ergocalciferol (VITAMIN D2) 50,000 unit capsule Take 50,000 Units by mouth every thirty (30) days.   ??? pantoprazole (PROTONIX) 40 mg tablet Take 1 Tab by mouth daily.   ??? vitamin E (AQUA GEMS) 400 unit capsule Take  by mouth daily. Last dose 10/10/13   ??? budesonide-formoterol (SYMBICORT) 160-4.5 mcg/actuation HFA inhaler Take 2 puffs by inhalation as needed. Does not use   ??? levothyroxine (SYNTHROID) 50 mcg tablet Take 1 Tab by mouth Daily (before breakfast).   ??? docusate sodium (COLACE) 100 mg capsule Take 100 mg by mouth every morning.   ??? calcium carbonate (CALTREX) 600 mg (1,500 mg) tablet Take 600 mg by mouth daily.   ??? MULTIVITAMIN WITH MINERALS (ONE-A-DAY 50 PLUS PO) Take  by mouth. Last dose 10/10/13       Social History:  History   Substance Use Topics   ??? Smoking status: Never Smoker    ??? Smokeless tobacco: Not on file   ??? Alcohol Use: Yes      Comment: social     Pt denies any history of IV drug use, blood transfusions, tattoos, prophylactic antibiotics prior to dental work, cardiac stents, pacemaker placement, or vaccinations for Hep A or B.    Family History:  History reviewed. No pertinent family history.    Review of Systems:  A detailed 10 system ROS is obtained, with pertinent positives as listed above.  All others are negative.    Objective:     Physical Exam:  Vitals:  BP 204/93   Pulse 73    Temp(Src) 97.9 ??F (36.6 ??C)   Resp 18   Ht 5\' 7"  (1.702 m)   Wt 68.04 kg (150 lb)   BMI 23.49 kg/m2   SpO2 96%  Gen:  Pt is alert, cooperative, no acute distress  Skin:  Extremities and face reveal no rashes. No palmer erythema. No telangiectasias on the chest wall.  HEENT: Sclerae anicteric.  Extra-occular muscles are intact.  No oral ulcers.  No abnormal pigmentation of the lips.  The neck is supple.  Cardiovascular: Regular rate and rhythm. No murmurs, gallops, or rubs.  Respiratory:  Comfortable breathing with no accessory muscle use. Clear breath sounds with no wheezes, rales, or rhonchi.  GI:  Abdomen nondistended, soft, and nontender.  Normal active bowel sounds. No enlargement of the liver or spleen. No masses palpable.  Rectal:  Deferred  Musculoskeletal:  No pitting edema of the lower legs.  Extremities have good range of motion.  No costovertebral tenderness.  Neurological:  Gross memory appears intact.  Patient is alert and oriented.  Psychiatric:  Mood appears appropriate with judgement intact.  Lymphatic:  No cervical or supraclavicular adenopathy.    Laboratory:    Recent Labs      02/26/14   1734   WBC  4.6   HGB  14.8   HCT  42.1   PLT  258   MCV  80.5   NA  138   K  3.7   CL  107   CO2  16*   BUN  12   CREA  1.37*   CA  9.5   GLU  124*   AP  405*   SGOT  237*   ALT  311*   TBILI  6.4*   ALB  3.8   TP  7.5   LPSE  446*       Assessment:       Active Problems:    Biliary obstruction (02/26/2014)        Plan:       Biliary Obstruction-  Hx consistent with CBD stone, now with increased bili and biliary dilation on USD.  Plan for Admission to control nausea.  Plan for ERCP tomorrow with Dr Orpah Melter.  Will initiate empiric Cipro in the interim.  Moderate elevation in lipase may indicate early pancreatitis, we will hydrate judiciously with normal saline.     Assessment and plan as per Dr. Jarold Song.

## 2014-02-26 NOTE — ED Notes (Signed)
To ultrasound via stretcher.

## 2014-02-26 NOTE — ED Notes (Signed)
IV fluids begun.  Medicated as directed.  IV site without redness, edema or drainage.

## 2014-02-27 MED ORDER — SODIUM CHLORIDE 0.9 % IJ SYRG
Freq: Three times a day (TID) | INTRAMUSCULAR | Status: DC
Start: 2014-02-27 — End: 2014-02-28
  Administered 2014-02-27 – 2014-02-28 (×5): via INTRAVENOUS

## 2014-02-27 MED ORDER — ONDANSETRON (PF) 4 MG/2 ML INJECTION
4 mg/2 mL | INTRAMUSCULAR | Status: DC | PRN
Start: 2014-02-27 — End: 2014-02-28
  Administered 2014-02-27 – 2014-02-28 (×2): via INTRAVENOUS

## 2014-02-27 MED ORDER — SODIUM CHLORIDE 0.9 % IJ SYRG
INTRAMUSCULAR | Status: DC | PRN
Start: 2014-02-27 — End: 2014-02-28

## 2014-02-27 MED ORDER — DIPHENHYDRAMINE 25 MG CAP
25 mg | Freq: Four times a day (QID) | ORAL | Status: DC | PRN
Start: 2014-02-27 — End: 2014-02-28

## 2014-02-27 MED ORDER — DEXTROSE 5%-1/2 NORMAL SALINE IV
INTRAVENOUS | Status: DC
Start: 2014-02-27 — End: 2014-02-28
  Administered 2014-02-27 – 2014-02-28 (×2): via INTRAVENOUS

## 2014-02-27 MED ORDER — SODIUM CHLORIDE 0.9 % IV
INTRAVENOUS | Status: DC
Start: 2014-02-27 — End: 2014-02-28

## 2014-02-27 MED ORDER — INDOMETHACIN 50 MG RECTAL SUPPOSITORY
50 mg | RECTAL | Status: DC | PRN
Start: 2014-02-27 — End: 2014-02-28

## 2014-02-27 MED ORDER — IOTHALAMATE MEGLUMINE 60 % INJECTION
60 % | INTRAMUSCULAR | Status: DC | PRN
Start: 2014-02-27 — End: 2014-02-28
  Administered 2014-02-27: 20:00:00

## 2014-02-27 MED ORDER — HYDROMORPHONE (PF) 1 MG/ML IJ SOLN
1 mg/mL | INTRAMUSCULAR | Status: DC | PRN
Start: 2014-02-27 — End: 2014-02-28
  Administered 2014-02-27: 06:00:00 via INTRAVENOUS

## 2014-02-27 MED ORDER — CIPROFLOXACIN IN D5W 400 MG/200 ML IV PIGGY BACK
400 mg/200 mL | Freq: Two times a day (BID) | INTRAVENOUS | Status: DC
Start: 2014-02-27 — End: 2014-02-27

## 2014-02-27 MED ORDER — SODIUM CHLORIDE 0.9 % IJ SYRG
Freq: Three times a day (TID) | INTRAMUSCULAR | Status: DC
Start: 2014-02-27 — End: 2014-02-28
  Administered 2014-02-28 (×3): via INTRAVENOUS

## 2014-02-27 MED ORDER — PANTOPRAZOLE 40 MG TAB, DELAYED RELEASE
40 mg | Freq: Every day | ORAL | Status: DC
Start: 2014-02-27 — End: 2014-02-28
  Administered 2014-02-27 – 2014-02-28 (×2): via ORAL

## 2014-02-27 MED ORDER — GLUCAGON 1 MG INJECTION
1 mg | INTRAMUSCULAR | Status: DC | PRN
Start: 2014-02-27 — End: 2014-02-28

## 2014-02-27 MED ADMIN — lactated ringers infusion: INTRAVENOUS | @ 19:00:00 | NDC 00409795309

## 2014-02-27 MED ADMIN — multivitamin, stress formula (STRESS TAB) tablet 1 Tab: ORAL | @ 14:00:00

## 2014-02-27 MED ADMIN — ondansetron (ZOFRAN) injection: INTRAVENOUS | @ 20:00:00 | NDC 00781301072

## 2014-02-27 MED ADMIN — fentaNYL citrate (PF) injection: INTRAVENOUS | @ 20:00:00 | NDC 00409909422

## 2014-02-27 MED ADMIN — levothyroxine (SYNTHROID) tablet 50 mcg: ORAL | @ 10:00:00 | NDC 00074455211

## 2014-02-27 MED ADMIN — glycopyrrolate (ROBINUL) injection: INTRAVENOUS | @ 20:00:00 | NDC 00143968101

## 2014-02-27 MED ADMIN — neostigmine (PROSTIGMINE) injection: INTRAVENOUS | @ 20:00:00 | NDC 76014000310

## 2014-02-27 MED ADMIN — calcium carbonate (TUMS) chewable tablet 200 mg: ORAL | @ 14:00:00 | NDC 66553000401

## 2014-02-27 MED ADMIN — propofol (DIPRIVAN) 10 mg/mL injection: INTRAVENOUS | @ 20:00:00 | NDC 63323027025

## 2014-02-27 MED ADMIN — ciprofloxacin (CIPRO) 400 mg IVPB (premix): INTRAVENOUS | @ 22:00:00 | NDC 36000000924

## 2014-02-27 MED ADMIN — PARoxetine (PAXIL) tablet 20 mg: ORAL | @ 14:00:00 | NDC 68084004511

## 2014-02-27 MED ADMIN — levothyroxine (SYNTHROID) tablet 50 mcg: ORAL | @ 12:00:00 | NDC 72865023790

## 2014-02-27 MED ADMIN — dexamethasone (DECADRON) 4 mg/mL injection: INTRAVENOUS | @ 20:00:00 | NDC 63323016501

## 2014-02-27 MED ADMIN — rocuronium (ZEMURON) injection: INTRAVENOUS | @ 20:00:00 | NDC 67457022810

## 2014-02-27 MED ADMIN — ciprofloxacin (CIPRO) 400 mg IVPB (premix): INTRAVENOUS | @ 06:00:00 | NDC 36000000924

## 2014-02-27 MED FILL — HYDROMORPHONE (PF) 1 MG/ML IJ SOLN: 1 mg/mL | INTRAMUSCULAR | Qty: 1

## 2014-02-27 MED FILL — ROCURONIUM 10 MG/ML IV: 10 mg/mL | INTRAVENOUS | Qty: 20

## 2014-02-27 MED FILL — PAROXETINE 20 MG TAB: 20 mg | ORAL | Qty: 1

## 2014-02-27 MED FILL — INDOCIN 50 MG RECTAL SUPPOSITORY: 50 mg | RECTAL | Qty: 2

## 2014-02-27 MED FILL — ONDANSETRON (PF) 4 MG/2 ML INJECTION: 4 mg/2 mL | INTRAMUSCULAR | Qty: 2

## 2014-02-27 MED FILL — CONRAY 60 % INJECTION SOLUTION: 60 % | INTRAMUSCULAR | Qty: 30

## 2014-02-27 MED FILL — LACTATED RINGERS IV: INTRAVENOUS | Qty: 1000

## 2014-02-27 MED FILL — GLYCOPYRROLATE 0.2 MG/ML IJ SOLN: 0.2 mg/mL | INTRAMUSCULAR | Qty: 0.4

## 2014-02-27 MED FILL — BUDESONIDE 0.5 MG/2 ML NEB SUSPENSION: 0.5 mg/2 mL | RESPIRATORY_TRACT | Qty: 1

## 2014-02-27 MED FILL — ALBUTEROL SULFATE 0.083 % (0.83 MG/ML) SOLN FOR INHALATION: 2.5 mg /3 mL (0.083 %) | RESPIRATORY_TRACT | Qty: 1

## 2014-02-27 MED FILL — CIPROFLOXACIN IN D5W 400 MG/200 ML IV PIGGY BACK: 400 mg/200 mL | INTRAVENOUS | Qty: 200

## 2014-02-27 MED FILL — SODIUM CHLORIDE 0.9 % IV: INTRAVENOUS | Qty: 1000

## 2014-02-27 MED FILL — NEOSTIGMINE METHYLSULFATE 1 MG/ML INJECTION: 1 mg/mL | INTRAMUSCULAR | Qty: 3

## 2014-02-27 MED FILL — DEXTROSE 5%-1/2 NORMAL SALINE IV: INTRAVENOUS | Qty: 1000

## 2014-02-27 MED FILL — STRESS FORMULA TABLET: ORAL | Qty: 1

## 2014-02-27 MED FILL — CALCIUM CARBONATE 200 MG (500 MG) CHEWABLE TAB: 200 mg calcium (500 mg) | ORAL | Qty: 1

## 2014-02-27 MED FILL — PANTOPRAZOLE 40 MG TAB, DELAYED RELEASE: 40 mg | ORAL | Qty: 1

## 2014-02-27 MED FILL — LEVOTHROID 50 MCG TABLET: 50 mcg | ORAL | Qty: 1

## 2014-02-27 MED FILL — DEXAMETHASONE SODIUM PHOSPHATE 10 MG/ML IJ SOLN: 10 mg/mL | INTRAMUSCULAR | Qty: 5

## 2014-02-27 MED FILL — PROPOFOL 10 MG/ML IV EMUL: 10 mg/mL | INTRAVENOUS | Qty: 100

## 2014-02-27 NOTE — Other (Signed)
Dr Isaiah Blakes at bedside Exam of patients mouth done by  Dr Tonie Griffith states patient's jaw is now in place Patient states she feels much better now

## 2014-02-27 NOTE — Anesthesia Pre-Procedure Evaluation (Addendum)
Anesthetic History   No history of anesthetic complications       Comments: / h/o of jaw problem with intubation in the past.    Review of Systems / Medical History  Patient summary reviewed, nursing notes reviewed and pertinent labs reviewed    Pulmonary    COPD: mild             Neuro/Psych   Within defined limits           Cardiovascular                Exercise tolerance: <4 METS     GI/Hepatic/Renal     GERD: well controlled             Endo/Other      Hypothyroidism: well controlled  Arthritis     Other Findings            Physical Exam    Airway  Mallampati: II  TM Distance: 4 - 6 cm  Neck ROM: normal range of motion   Mouth opening: Normal     Cardiovascular    Rhythm: irregular  Rate: abnormal    Murmur: Grade 2, Aortic area     Dental    Dentition: Caps/crowns     Pulmonary  Breath sounds clear to auscultation               Abdominal         Other Findings            Anesthetic Plan    ASA: 3  Anesthesia type: general          Induction: Intravenous  Anesthetic plan and risks discussed with: Patient

## 2014-02-27 NOTE — Progress Notes (Addendum)
Pt admitted to room 601 from ER daughter at [facility]. Pt A/O x4. Oriented pt and family to room and call light system, to call with needs, verbalized understanding. Dual skin assessment with this RN and Reba, RN, no breakdown noted. Skin C/D/I. C/o pain and nausea, medicated per MAR. Side effects medication sheet given and reviewed with family. Aware of NPO status. Admission database and assessment completed. Extremely hypertensive upon arrival to floor. Will continue to monitor.

## 2014-02-27 NOTE — Progress Notes (Signed)
Call received from [place], [place] in PACU, requesting 1400 dose of Cipro be tubed to PACU. Pt awaiting arrival of ENT in PACU. Cipro tubed to PACU.

## 2014-02-27 NOTE — Progress Notes (Signed)
Problem: Mobility Impaired (Adult and Pediatric)  Goal: *Acute Goals and Plan of Care (Insert Text)  LTG:  (1.)[name] will move from supine to sit and sit to supine , scoot up and down and roll side to side INDEPENDENTLY with bed flat within 7 day(s).   (2.)[name] will transfer from bed to chair and chair to bed with SUPERVISION using the least restrictive device within 7 day(s).   (3.)[name] will ambulate with STAND BY ASSIST for 150+ feet with the least restrictive device within 7 day(s).   ________________________________________________________________________________________________   PHYSICAL THERAPY: INITIAL ASSESSMENT AND PM  INPATIENT: Medicare : Hospital Day: 2    NAME/AGE/GENDER: Jo Simmons is a 80 y.o. female  DATE: 02/27/2014  PRIMARY DIAGNOSIS: Biliary obstruction  cbd stone  <principal problem not specified>  <principal problem not specified>  Procedure(s) (LRB):  ENDOSCOPIC RETROGRADE CHOLANGIOPANCREATOGRAPHY   rm 601 (N/A)  ENDOSCOPIC SPHINCTEROTOMY (N/A)  ENDOSCOPIC STONE EXTRACTION/BALLOON SWEEP (N/A)  Day of Surgery     INTERDISCIPLINARY COLLABORATION: Physical Therapist and Registered Nurse  ASSESSMENT:   [name] admitted from home for biliary obstruction. She presents today in supine with daughters at bedside, agreeable to therapy evaluation. She is pleasant, slightly confused at times. Participates well with therapy. Transfers to sitting with supervision and cueing. Pt with 4/5 LE strength and sensation intact. Denies pain at this time. Sit-stand transfers and ambulation around room with CGA-min assist for safety using her home rolling walker. Requires close CGA with toilet transfers and self care activities. She is slightly impulsive at times and with decreased safety awareness. Returned to bed afterwards then transport present to take for procedure. Ambulates out to stretcher with handheld assist. Overall she seems to be functioning fairly close to her baseline with slightly  decreased balance, strength, and activity tolerance. Would benefit from PT during acute care stay to address below deficits and maximize safety/independence. Pt has HEP at home which daughters assist her with daily. May benefit from [facility] PT at discharge pending progress.       ????????This section established at most recent assessment??????????  PROBLEM LIST (Impairments causing functional limitations):  1. Decreased Strength affecting function  2. Decreased ADL/Functional Activities  3. Decreased Transfer Abilities  4. Decreased Ambulation Ability/Technique  5. Decreased Balance  6. Increased Pain affecting function  7. Decreased Activity Tolerance  REHABILITATION POTENTIAL FOR STATED GOALS: GOOD      PLAN OF CARE:   INTERVENTIONS PLANNED: (Benefits and precautions of physical therapy have been discussed with the patient.)  1. balance exercise  2. bed mobility  3. gait training  4. therapeutic activities  5. therapeutic exercise/strengthening  6. transfer training  FREQUENCY/DURATION: Follow patient 1-2 times per day/4-7 days per week until goals are met in order to address above goals.    RECOMMENDED REHABILITATION/EQUIPMENT: (at time of discharge pending progress):   continue acute PT- discharge needs TBD pending progress.  SUBJECTIVE:   "I can walk"    Present Symptoms: no complaints   Pain Intensity 1: 0  History of Present Injury/Illness: Per H&P, "Patient is a 80 y.o. female patient of Dr Juanda Chance with PMH of gastric carcinoids, who is being admitted for nausea vomiting and biliary obstruction. She has a several week history of intermittent episodes of post-prandial RUQ pain associated with nausea and vomiting. Recently she has noted dark urine and jaundiced eues over the past week. She denies fever or shaking chills, currently she has moderate eoigastric pain associated with nausea. LFTs demonstrate a  bili of 6 with AST/ALT in the 300 range"    Prior Level of Function/Home Situation: [name] lives alone in  single story, first floor apartment. She has caregiver assistance in mornings and evenings, then daughters fill in to assist her during the day. Sounds like she is rarely alone. Ambulates around house with her rolling walker. Has help with all bathing, ADLs, and meals. Limited by hx of dementia. She is typically able to make it to bathroom on her own during the day.    Home Environment: Apartment  # Steps to Enter: 0  One/Two Story Residence: One story  Living Alone: Yes  Support Systems: Child(ren), Family member(s), Home care staff  Patient Expects to be Discharged to:: Apartment  Current DME Used/Available at Home: Walker, rolling, Shower chair  OBJECTIVE/TREATMENT:   (In addition to Assessment/Re-Assessment sessions the following treatments were rendered)                                      [facility]??? ???6 Clicks???                                          Basic Mobility Inpatient Short Form  How much difficulty does the patient currently have... Unable A Lot A Little None   1.  Turning over in bed (including adjusting bedclothes, sheets and blankets)?   '[ ]'  1   '[ ]'  2   '[ ]'  3   '[X]'  4   2.  Sitting down on and standing up from a chair with arms ( e.g., wheelchair, bedside commode, etc.)   '[ ]'  1   '[ ]'  2   '[ ]'  3   '[X]'  4   3.  Moving from lying on back to sitting on the side of the bed?   '[ ]'  1   '[ ]'  2   '[ ]'  3   '[X]'  4               How much help from another person does the patient currently need... Total A Lot A Little None   4.  Moving to and from a bed to a chair (including a wheelchair)?   '[ ]'  1   '[ ]'  2   '[X]'  3   '[ ]'  4   5.  Need to walk in hospital room?   '[ ]'  1   '[ ]'  2   '[X]'  3   '[ ]'  4   6.  Climbing 3-5 steps with a railing?   '[ ]'  1   '[X]'  2   '[ ]'  3   '[ ]'  4   ?? 2007, [employer] [place], under license to [employer]. All rights reserved       Score:  Initial: 20 Most Recent: X (Date: -- )   Interpretation of Tool:  Represents activities that are increasingly more difficult (i.e. Bed  mobility, Transfers, Gait).  Score 24 23 22-20 19-15 14-10 9-7 6   Modifier CH CI CJ CK CL CM CN       ?? Mobility - Walking and Moving Around:              J5009 - CURRENT STATUS:        CJ - 20%-39% impaired, limited or restricted  G8979 - GOAL STATUS:                CI - 1%-19% impaired, limited or restricted              Q0347 - D/C STATUS:                    ---------------To be determined---------------  Payor: [employer] MEDICARE / Plan: [employer] HMO / Product Type: Managed Care Medicare /    Most Recent Physical Functioning:   Gross Assessment:  AROM: Within functional limits (B LEs in sitting)  Strength: Generally decreased, functional  Coordination: Generally decreased, functional  Sensation: Intact (to light touch B LEs)               Posture:  Posture (WDL): Exceptions to WDL  Posture Assessment: Forward head, Rounded shoulders  Balance:  Sitting: Intact  Standing: Impaired  Standing - Static: Fair  Standing - Dynamic : Fair  Bed Mobility:  Rolling: Supervision  Supine to Sit: Supervision;Verbal cues  Sit to Supine: Supervision;Verbal cues  Scooting: Supervision  Wheelchair Mobility:     Transfers:  Sit to Stand: CGA  Stand to Sit: CGA  Bed to Chair: CGA;Minimum assistance;Verbal cues  Interventions: Safety awareness training;Tactile cues;Verbal cues;Visual cues  Duration: 15 Minutes  Gait:     Base of Support: Center of gravity altered  Speed/Cadence: Fluctuations  Step Length: Left shortened;Right shortened  Gait Abnormalities: Trunk sway increased;Decreased step clearance;Path deviations  Distance (ft): 10 Feet (ft) (x2 to bathroom and back)  Assistive Device: Walker, rolling  Ambulation - Level of Assistance: CGA;Minimal assistance  Interventions: Verbal cues;Visual/Demos;Safety awareness training;Tactile cues      Therapeutic Activity: (  15 Minutes ):  Therapeutic activities including Bed transfers, Chair transfers, Toilet transfers, Ambulation on level ground and standing  activities at sink to improve mobility, strength, balance, coordination and activity tolerance.  Required moderate Verbal cues;Visual/Demos;Safety awareness training;Tactile cues to promote static and dynamic balance in standing and promote coordination of bilateral, upper extremity(s), lower extremity(s).     Braces/Orthotics/Lines/Etc:   ?? IV  ?? O2 Device: Room air  Safety:   After treatment position/precautions:  ?? pt left on stretcher with transport for ERCP  Progression/Medical Necessity:   ?? Patient demonstrates good rehab potential due to higher previous functional level.  Compliance with Program/Exercises: compliant all of the time.   Reason for Continuation of Services/Other Comments:  ?? Patient continues to demonstrate capacity to improve strength, balance, mobility, activity tolerance which will increase independence, decrease amount of assistance required from caregiver and increase safety.  Recommendations/Intent for next treatment session: Treatment next visit will focus on advancements to more challenging activities and reduction in assistance provided.  Total Treatment Duration:  Time In: 1310  Time Out: [place], DPT

## 2014-02-27 NOTE — Progress Notes (Signed)
A/O x4 when asking pt questions. After pt wakes from sleep, experiences confusion stating her home doesn't usually look like this.

## 2014-02-27 NOTE — Other (Signed)
Patient C/O not able to close mouth ;Lower jaw is further to the left She is able to open mouth wide but not close   Dr Carollee Leitz is aware He has called Dr Isaiah Blakes and is awaiting his return call   Daughter is at bedside

## 2014-02-27 NOTE — Interval H&P Note (Signed)
H&P Update:  Jo Simmons was seen and examined.  History and physical has been reviewed. There have been no significant clinical changes since the completion of the originally dated History and Physical.    Endoscopy and risks explained to the patient.  Risks including reaction to sedation, cardiopulmonary events, infection, bleeding, perforation, requirement for surgery if complications should occur, pancreatitis, death were explained to patient and daughter who expressed understanding and agreed to proceed with endoscopy.      Signed By: Buel Ream, MD     February 27, 2014 2:37 PM

## 2014-02-27 NOTE — Progress Notes (Signed)
Received patient in GI lab from room 601 for procedure.

## 2014-02-27 NOTE — Progress Notes (Signed)
TRANSFER - OUT REPORT:    Verbal report given to Katie, RN(name) on Jo Simmons  being transferred to GI Lab(unit) for ordered procedure       Report consisted of patient???s Situation, Background, Assessment and   Recommendations(SBAR).     Information from the following report(s) SBAR was reviewed with the receiving nurse.    Lines:   Peripheral IV 02/26/14 Right Antecubital (Active)   Site Assessment Clean, dry, & intact 02/27/2014  7:05 AM   Phlebitis Assessment 0 02/27/2014  7:05 AM   Infiltration Assessment 0 02/27/2014  7:05 AM   Dressing Status Clean, dry, & intact 02/27/2014  7:05 AM   Dressing Type Tape;Transparent 02/27/2014  7:05 AM   Hub Color/Line Status Pink;Infusing 02/27/2014  7:05 AM        Opportunity for questions and clarification was provided.        Patient transported with:   Tech    GI nurse Katie informed per pt's family on 2 previous endoscopy procedures pt's jaw dislocated. GI nurse will inform anesthesia.

## 2014-02-27 NOTE — Procedures (Addendum)
ERCP: Endoscopic Retrograde Cholangiopancreatogram    DATE of PROCEDURE: 02/27/2014      MEDICATIONS ADMINISTERED:  MAC    INSTRUMENT:    PROCEDURE:  After obtaining informed consent, the patient was placed in prone position on the fluoroscopy table.  The patient was then sedated.  The endoscope was passed under indirect technique to the oropharynx and gently passed into the esophagus.  The endoscope was passed to the ampulla.  Only partial views of the stomach and duodenum were obtained.  After the procedure, the patient was taken to the recovery area in stable condition.    FINDINGS:  AMPULLA: large, located in a diverticulum    BILE DUCT: bile duct cannulated easily with a 44 sphincterotome over a 0.035 wire. Distal cholangiogram revealed 3 large distal CBD stones and a 12 mm bile duct. A moderate sphincterotomy was performed, cutting along the intraduodenal segment of the ampulla. A balloon sweep removed the 3 stones as well as biliary sludge. Multiple sweeps were performed, removing additional sludge.     There was an area of angulation that appeared mildly stenotic on cholangiogram of the distal bile duct. The 12 mm balloon was able to pass with only mild resistance. This probably represented a change in angle of the bile duct where it entered the duodenal diverticulum, but liver enzymes should be followed in the future, and MRCP or EUS may be considered.    A cholangiogram was then obtained and demonstrated a clear duct.    PANCREATIC DUCT: not cannulated or injected    ASSESSMENT/PLAN:  1. Return to floor  2. Low fat diet  3. DC tomorrow if feels well  4. Follow LFTs as outpatient, may consider MRCP/EUS to look at distal duct in the future  5. Surgery consult      Susa Raring, MD  [facility] [place]

## 2014-02-27 NOTE — Progress Notes (Signed)
Paged Dr. Jarold Song about pt BP 222/101. To give dilaudid per [facility] and reassess, no new orders at this time. Will continue to monitor.

## 2014-02-27 NOTE — Anesthesia Post-Procedure Evaluation (Signed)
Post-Anesthesia Evaluation and Assessment    Patient: Jo Simmons MRN: [id number]  SSN: [id number]    Date of Birth: November 26, 1927  Age: 80 y.o.  Sex: female       Cardiovascular Function/Vital Signs  Visit Vitals   Item Reading   ??? BP 177/80   ??? Pulse 71   ??? Temp 36.8 ??C (98.2 ??F)   ??? Resp 22   ??? Ht 5\' 7"  (1.702 m)   ??? Wt 68.04 kg (150 lb)   ??? BMI 23.49 kg/m2   ??? SpO2 97%   ??? Breastfeeding No       Patient is status post general anesthesia for Procedure(s):  ENDOSCOPIC RETROGRADE CHOLANGIOPANCREATOGRAPHY   rm 601  ENDOSCOPIC SPHINCTEROTOMY  ENDOSCOPIC STONE EXTRACTION/BALLOON SWEEP.    Nausea/Vomiting: None    Postoperative hydration reviewed and adequate.    Pain:  Pain Scale 1: Numeric (0 - 10) (02/27/14 1759)  Pain Intensity 1: 0 (02/27/14 1759)   Managed    Neurological Status:   Neuro (WDL): Exceptions to WDL (02/27/14 1618)  Neuro  Neurologic State: Eyes open to voice (02/27/14 1618)  Orientation Level: Oriented to person;Disoriented to place;Disoriented to situation;Disoriented to time (02/27/14 1618)  Cognition: Follows commands (02/27/14 1618)  Speech: Clear (02/27/14 1618)  Assessment L Pupil: Round (02/27/14 0136)  Assessment R Pupil: Round (02/27/14 0136)  LUE Motor Response: Spontaneous ;Purposeful (02/27/14 1618)  LLE Motor Response: Spontaneous ;Purposeful (02/27/14 1618)  RUE Motor Response: Spontaneous ;Purposeful (02/27/14 1618)  RLE Motor Response: Spontaneous ;Purposeful (02/27/14 1618)   At baseline    Mental Status and Level of Consciousness: Alert and oriented     Pulmonary Status:   O2 Device: Room air (02/27/14 1713)   Adequate oxygenation and airway patent    Complications related to anesthesia: None    Post-anesthesia assessment completed. No concerns    Signed By: Delton Prairie, MD     February 27, 2014

## 2014-02-27 NOTE — Other (Signed)
Interdisciplinary team rounds were held 02/27/2014 with the following team members:Clinical Coordinator and Social Work and the patient and child(ren).    Plan of care discussed. See clinical pathway and/or care plan for interventions and desired outcomes.    Patient lives alone in a one-level apartment and uses walker to ambulate. Two daughters manage food preparation and visit regularly. [employer] ([employer]er, manufacturing) comes from 10a-12p and 7p-9p to assist with housekeeping, etc. No needs identified at this time. SW to follow should discharge needs arise.

## 2014-02-27 NOTE — Other (Signed)
15 minutes given for chart review.  Transport requested via connect care.

## 2014-02-27 NOTE — Progress Notes (Signed)
Patient taken to PACU following procedure. No apparent distress. Handoff report given. J.A. Sabra Heck, RN

## 2014-02-27 NOTE — Progress Notes (Addendum)
Met with patient at the bedside. She is alert and vague. Obvious memory issues and her daughter was aslo there and filled in the blanks.     Patient lives alone and does well with the assistance of private duty from [place] and two daughters.     She has a care taker Monday thru Friday 10am  - 12 pm and 7-pm - 9pm. The teo sisters provide meals and weekend coverage. The split the week during the midday hours.  Patient is well cared for.     She ambulates with a rolling walker. One level apartment. PT consulted.     Planned for an ERCP today. Hoping no major issues and patient and family want her to go home and resume her normal schedule when stable.   Cooper Render Vertell Limber, LISW

## 2014-02-27 NOTE — Progress Notes (Signed)
BP down 120/71 after Dilaudid adm

## 2014-02-27 NOTE — Progress Notes (Signed)
TRANSFER - IN REPORT:    Verbal report received from [place], RN(name) on Jo Simmons  being received from [employer]) for routine progression of care      Report consisted of patient???s Situation, Background, Assessment and   Recommendations(SBAR).     Information from the following report(s) SBAR was reviewed with the receiving nurse.    Opportunity for questions and clarification was provided.      Assessment completed upon patient???s arrival to unit and care assumed.

## 2014-02-27 NOTE — Procedures (Signed)
ERCP: Endoscopic Retrograde Cholangiopancreatogram    DATE of PROCEDURE: 02/27/2014      MEDICATIONS ADMINISTERED:  MAC    INSTRUMENT:    PROCEDURE:  After obtaining informed consent, the patient was placed in prone position on the fluoroscopy table.  The patient was then sedated.  The endoscope was passed under indirect technique to the oropharynx and gently passed into the esophagus.  The endoscope was passed to the ampulla.  Only partial views of the stomach and duodenum were obtained.  After the procedure, the patient was taken to the recovery area in stable condition.    FINDINGS:  AMPULLA: large, located in a diverticulum    BILE DUCT: bile duct cannulated easily with a 44 sphincterotome over a 0.035 wire. Distal cholangiogram revealed 3 large distal CBD stones and a 12 mm bile duct. A moderate sphincterotomy was performed, cutting along the intraduodenal segment of the ampulla. A balloon sweep removed the 3 stones as well as biliary sludge. Multiple sweeps were performed, removing additional sludge.     There was an area of angulation that appeared mildly stenotic on cholangiogram of the distal bile duct. The 12 mm balloon was able to pass with only mild resistance. This probably represented a change in angle of the bile duct where it entered the duodenal diverticulum, but liver enzymes should be followed in the future, and MRCP or EUS may be considered.    A cholangiogram was then obtained and demonstrated a clear duct.    PANCREATIC DUCT: not cannulated or injected    ASSESSMENT/PLAN:  1. Return to floor  2. Low fat diet  3. DC tomorrow if feels well  4. Follow LFTs as outpatient, may consider MRCP/EUS to look at distal duct in the future  5. Surgery consult      Susa Raring, MD  [facility] [place]

## 2014-02-28 LAB — METABOLIC PANEL, COMPREHENSIVE
A-G Ratio: 1 — ABNORMAL LOW (ref 1.2–3.5)
ALT (SGPT): 287 U/L — ABNORMAL HIGH (ref 12–65)
AST (SGOT): 223 U/L — ABNORMAL HIGH (ref 15–37)
Albumin: 2.6 g/dL — ABNORMAL LOW (ref 3.2–4.6)
Alk. phosphatase: 317 U/L — ABNORMAL HIGH (ref 50–136)
Anion gap: 11 mmol/L (ref 7–16)
BUN: 6 MG/DL — ABNORMAL LOW (ref 8–23)
Bilirubin, total: 6.4 MG/DL — ABNORMAL HIGH (ref 0.2–1.1)
CO2: 21 mmol/L (ref 21–32)
Calcium: 7.9 MG/DL — ABNORMAL LOW (ref 8.3–10.4)
Chloride: 109 mmol/L — ABNORMAL HIGH (ref 98–107)
Creatinine: 0.89 MG/DL (ref 0.6–1.0)
GFR est AA: >60 mL/min/{1.73_m2} (ref >60)
GFR est non-AA: >60 mL/min/{1.73_m2} (ref >60)
Globulin: 2.7 g/dL (ref 2.3–3.5)
Glucose: 94 mg/dL (ref 65–100)
Potassium: 3.2 mmol/L — ABNORMAL LOW (ref 3.5–5.1)
Protein, total: 5.3 g/dL — ABNORMAL LOW (ref 6.3–8.2)
Sodium: 141 mmol/L (ref 136–145)

## 2014-02-28 MED ORDER — CIPROFLOXACIN 250 MG TAB
250 mg | ORAL_TABLET | Freq: Two times a day (BID) | ORAL | Status: AC
Start: 2014-02-28 — End: 2014-03-05

## 2014-02-28 MED ADMIN — ciprofloxacin (CIPRO) 400 mg IVPB (premix): INTRAVENOUS | @ 18:00:00 | NDC 00409477702

## 2014-02-28 MED ADMIN — PARoxetine (PAXIL) tablet 20 mg: ORAL | @ 14:00:00 | NDC 68084004511

## 2014-02-28 MED ADMIN — mineral oil (FLEET) enema: RECTAL | @ 17:00:00 | NDC 00132030140

## 2014-02-28 MED ADMIN — calcium carbonate (TUMS) chewable tablet 200 mg: ORAL | @ 14:00:00 | NDC 66553000401

## 2014-02-28 MED ADMIN — ciprofloxacin (CIPRO) 400 mg IVPB (premix): INTRAVENOUS | @ 06:00:00 | NDC 36000000924

## 2014-02-28 MED ADMIN — levothyroxine (SYNTHROID) tablet 50 mcg: ORAL | @ 12:00:00 | NDC 72865023790

## 2014-02-28 MED ADMIN — levothyroxine (SYNTHROID) tablet 50 mcg: ORAL | @ 10:00:00 | NDC 00074455211

## 2014-02-28 MED ADMIN — multivitamin, stress formula (STRESS TAB) tablet 1 Tab: ORAL | @ 14:00:00

## 2014-02-28 MED FILL — STRESS FORMULA TABLET: ORAL | Qty: 1

## 2014-02-28 MED FILL — PANTOPRAZOLE 40 MG TAB, DELAYED RELEASE: 40 mg | ORAL | Qty: 1

## 2014-02-28 MED FILL — CALCIUM CARBONATE 200 MG (500 MG) CHEWABLE TAB: 200 mg calcium (500 mg) | ORAL | Qty: 1

## 2014-02-28 MED FILL — BUDESONIDE 0.5 MG/2 ML NEB SUSPENSION: 0.5 mg/2 mL | RESPIRATORY_TRACT | Qty: 1

## 2014-02-28 MED FILL — PAROXETINE 20 MG TAB: 20 mg | ORAL | Qty: 1

## 2014-02-28 MED FILL — ALBUTEROL SULFATE 0.083 % (0.83 MG/ML) SOLN FOR INHALATION: 2.5 mg /3 mL (0.083 %) | RESPIRATORY_TRACT | Qty: 1

## 2014-02-28 MED FILL — FLEET MINERAL OIL ENEMA: RECTAL | Qty: 133

## 2014-02-28 MED FILL — LEVOTHROID 50 MCG TABLET: 50 mcg | ORAL | Qty: 1

## 2014-02-28 MED FILL — ONDANSETRON (PF) 4 MG/2 ML INJECTION: 4 mg/2 mL | INTRAMUSCULAR | Qty: 2

## 2014-02-28 MED FILL — CIPROFLOXACIN IN D5W 400 MG/200 ML IV PIGGY BACK: 400 mg/200 mL | INTRAVENOUS | Qty: 200

## 2014-02-28 NOTE — Progress Notes (Signed)
Reports relief in nausea

## 2014-02-28 NOTE — Progress Notes (Signed)
Gastroenterology Progress Note           Date of admission:  02/26/2014    Today's date:  02/28/2014    CC:     Choledocholithiasis    HPI:   No events or new complaints. The patient denies any significant abdominal pain or nausea.    ROS:    Gen: No fevers, no chills   CV:   No chest pain, no SOB    Current Medications:     Current Facility-Administered Medications   Medication Dose Route Frequency   ??? calcium carbonate (TUMS) chewable tablet 200 mg [elemental]  200 mg Oral DAILY WITH BREAKFAST   ??? diphenhydrAMINE (BENADRYL) capsule 25 mg  25 mg Oral Q6H PRN   ??? levothyroxine (SYNTHROID) tablet 50 mcg  50 mcg Oral ACB   ??? multivitamin, stress formula (STRESS TAB) tablet 1 Tab  1 Tab Oral DAILY   ??? oxazepam (SERAX) capsule 10 mg  10 mg Oral QHS PRN   ??? pantoprazole (PROTONIX) tablet 40 mg  40 mg Oral DAILY   ??? PARoxetine (PAXIL) tablet 20 mg  20 mg Oral DAILY   ??? sodium chloride (NS) flush 5-10 mL  5-10 mL IntraVENous Q8H   ??? sodium chloride (NS) flush 5-10 mL  5-10 mL IntraVENous PRN   ??? dextrose 5 % - 0.45% NaCl infusion  100 mL/hr IntraVENous CONTINUOUS   ??? ondansetron (ZOFRAN) injection 4 mg  4 mg IntraVENous Q4H PRN   ??? HYDROmorphone (PF) (DILAUDID) injection 0.2 mg  0.2 mg IntraVENous Q4H PRN   ??? budesonide (PULMICORT) 500 mcg/2 ml nebulizer suspension  500 mcg Nebulization BID RT   ??? albuterol (PROVENTIL VENTOLIN) nebulizer solution 2.5 mg  2.5 mg Nebulization Q6H RT   ??? ciprofloxacin (CIPRO) 400 mg IVPB (premix)  400 mg IntraVENous Q12H   ??? lactated ringers infusion  100 mL/hr IntraVENous CONTINUOUS   ??? sodium chloride (NS) flush 5-10 mL  5-10 mL IntraVENous Q8H   ??? sodium chloride (NS) flush 5-10 mL  5-10 mL IntraVENous PRN   ??? famotidine (PF) (PEPCID) injection 20 mg  20 mg IntraVENous ONCE PRN   ??? famotidine (PEPCID) tablet 20 mg  20 mg Oral PRN   ??? lactated ringers infusion  100 mL/hr IntraVENous CONTINUOUS   ??? sodium chloride (NS) flush 5-10 mL  5-10 mL IntraVENous PRN   ??? iothalamate meglumine (CONRAY 60)  60 % contrast solution 30 mL  30 mL InterCATHeter Multiple   ??? glucagon (GLUCAGEN) injection 0.5-1 mg  0.5-1 mg IntraVENous Multiple   ??? indomethacin (INDOCIN) rectal suppository 100 mg  100 mg Rectal PRN   ??? sodium chloride (NS) flush 5-10 mL  5-10 mL IntraVENous Q8H   ??? 0.9% sodium chloride infusion  125 mL/hr IntraVENous CONTINUOUS       Physical Exam:   Vitals:  BP 137/65   Pulse 80   Temp(Src) 99 ??F (37.2 ??C)   Resp 18   Ht 5\' 7"  (1.702 m)   Wt 68.04 kg (150 lb)   BMI 23.49 kg/m2   SpO2 97%   Breastfeeding? No  Intake/Output:     06/04 1900 - 06/06 0659  In: 1466 [I.V.:1466]  Out: 2     HEENT:  No scleral icterus, no oral ulcers  Abdomen: Soft, nontender, normoactive bowel sounds  Extremities:  No cyanosis, no leg edema  Neuro:  Alert and oriented to person, place, and time    Data:     No results found  for this or any previous visit (from the past 24 hour(s)).    Impression/Plan:       80 year old female admitted with abdominal pain and elevated LFTs, found with choledocholithiasis, status post ERCP. She is clinically improving and approaching discharge.    1. Diet as tolerated.  2. Follow up morning LFTs.  3. Probable discharge later this morning.    Signed:  Jackquline Berlin, MD  02/28/2014  8:19 AM

## 2014-02-28 NOTE — Progress Notes (Signed)
C/O nausea, no emesis. Zofran 4 mg IV slow push given

## 2014-02-28 NOTE — Progress Notes (Signed)
Discharge teaching done with pt and care giver. Instructed on all care, medications, and appointments. Prescriptions and written instructions given. Verbalizes understanding, denies questions.

## 2014-02-28 NOTE — Progress Notes (Signed)
Fleets enema rectally given per order. Tolerated without complaints.

## 2014-02-28 NOTE — Progress Notes (Signed)
Call placed to Dr Tito Dine per daughters request since having nausea

## 2014-02-28 NOTE — Discharge Summary (Addendum)
[facility] Discharge Summary      Patient ID:  Jo Simmons  [id number]  80 y.o.  22-Sep-1928    Admit date:  02/26/2014    Discharge date and time:  02/28/2014     Admitting Physician:  Jo Haddock, MD     Discharge Physician: Jo. 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 Distal cholangiogram revealed 3 large distal CBD stones and a 12 mm bile duct. A moderate sphincterotomy was performed, cutting along the intraduodenal segment of the ampulla. A balloon sweep removed the 3 stones as well as biliary sludge. Multiple sweeps were performed, removing additional sludge.     There was an area of angulation that appeared mildly stenotic on cholangiogram of the distal bile duct. The 12 mm balloon was able to pass with only mild resistance. This probably represented a change in angle of the bile duct where it entered the duodenal diverticulum, but liver enzymes should be followed in the future, and MRCP or EUS may be considered.    A cholangiogram was then obtained and demonstrated a clear duct.    PANCREATIC DUCT: not cannulated or injected    ASSESSMENT/PLAN:  Consider MRCP/EUS to look at distal duct in the future  Surgery consult recommended but wants to discuss with Jo Simmons Course:  Patient is a 80 y.o. female patient of Jo Jo Simmons with PMH of gastric carcinoids, who was admitted for nausea vomiting and biliary obstruction. She had a several week history of intermittent episodes of post-prandial RUQ pain associated with nausea and vomiting as well as dark urine and jaundiced eyes for about a week prior to admission with LFTs demonstrate a bili of 6 with AST/ALT in the 300  range on admit. She is now s/p ERCP tolerating a low fat diet without complaint of nausea or abd pain. Daughter Jo Simmons) at bedside reports that pt lives at home in an apartment with health aids that come in to assist with her care.  LFTs 02/28/14:  Results for Jo, Simmons (MRN [id number]) as of 02/28/2014 10:14   Ref. Range 02/28/2014 07:45   Bilirubin, total Latest Range: 0.2-1.1 MG/DL 6.4 (H)   Protein, total Latest Range: 6.3-8.2 g/dL 5.3 (L)   Albumin Latest Range: 3.2-4.6 g/dL 2.6 (L)   Globulin Latest Range: 2.3-3.5 g/dL 2.7   A-G Ratio Latest Range: 1.2-3.5   1.0 (L)   ALT Latest Range: 12-65 U/L 287 (H)   AST Latest Range: 15-37 U/L 223 (H)   Alk. phosphatase Latest Range: 50-136 U/L 317 (H)           Objective:   Vitals:  BP 137/65   Pulse 80   Temp(Src) 99 ??F (37.2 ??C)   Resp 18   Ht '5\' 7"'  (1.702 m)   Wt 68.04 kg (150 lb)   BMI 23.49 kg/m2   SpO2 97%   Breastfeeding? No  Intake/Output:     06/04 1900 - 06/06 0659  In: 1466 [I.V.:1466]  Out: 2   Exam:  General appearance: sleepy  Lungs: clear to auscultation bilaterally anteriorly  Heart: regular rate  and rhythm  Abdomen: soft, non-tender. Bowel sounds normal. No masses, no organomegaly  Extremities: extremities normal, atraumatic, no cyanosis or edema    Disposition:  To home in care of daughter and health aids. She will have LFTs rechecked the middle of next week at Jo Simmons office with results faxed to [place]. Follow up in 2 weeks with Jo Simmons, will need OV with Jo Simmons to discuss possible surgical referral.    Diet:  Low fat GI soft, advance as tolerated to low fat regular    Patient Instructions:   Current Discharge Medication List      START taking these medications    Details   ciprofloxacin HCl (CIPRO) 250 mg tablet Take 2 Tabs by mouth every twelve (12) hours for 5 days.  Qty: 20 Tab, Refills: 0         CONTINUE these medications which have NOT CHANGED    Details   PARoxetine (PAXIL) 20 mg tablet Take 1 Tab by mouth daily.  Qty: 90 Tab,  Refills: 3      diphenhydrAMINE (BENADRYL) 25 mg capsule Take 25 mg by mouth every six (6) hours as needed.      !! oxazepam (SERAX) 15 mg capsule       estradiol (ESTRACE) 0.01 % (0.1 mg/gram) vaginal cream 1 gram vaginally 3x/week  Qty: 42.5 g, Refills: 6    Associated Diagnoses: Prolapse urethral mucosa      cyanocobalamin (VITAMIN B12) 1,000 mcg/mL injection 1 mL by IntraMUSCular route every thirty (30) days.  Qty: 3 Vial, Refills: 5      !! oxazepam (SERAX) 10 mg capsule Take 1 Cap by mouth nightly as needed for Sleep or Anxiety. Max Daily Amount: 10 mg.  Qty: 30 Cap, Refills: 4      ergocalciferol (VITAMIN D2) 50,000 unit capsule Take 50,000 Units by mouth every thirty (30) days.      pantoprazole (PROTONIX) 40 mg tablet Take 1 Tab by mouth daily.  Qty: 90 Tab, Refills: 4      vitamin Detrice Cales (AQUA GEMS) 400 unit capsule Take  by mouth daily. Last dose 10/10/13      budesonide-formoterol (SYMBICORT) 160-4.5 mcg/actuation HFA inhaler Take 2 puffs by inhalation as needed. Does not use      levothyroxine (SYNTHROID) 50 mcg tablet Take 1 Tab by mouth Daily (before breakfast).  Qty: 90 Tab, Refills: 3      docusate sodium (COLACE) 100 mg capsule Take 100 mg by mouth every morning.      calcium carbonate (CALTREX) 600 mg (1,500 mg) tablet Take 600 mg by mouth daily.      MULTIVITAMIN WITH MINERALS (ONE-A-DAY 50 PLUS PO) Take  by mouth. Last dose 10/10/13      BD LUER-LOK SYRINGE 3 mL 25 x 1" syrg        !! - Potential duplicate medications found. Please discuss with provider.      STOP taking these medications       predniSONE (DELTASONE) 20 mg tablet Comments:   Reason for Stopping:               Follow up:    Follow-up Appointments   Procedures   ??? FOLLOW UP VISIT Appointment in: One Month Our office will call her with follow up in one month.     Our office will call her with follow up in one month.     Standing Status: Standing      Number of Occurrences: 1  Standing Expiration Date:      Order Specific Question:   Appointment in     Answer:  One Month       Total time discharging patient took greater than 30 minutes.    Signed:  Alvina Filbert, NP  02/28/2014  10:32 AM

## 2014-02-28 NOTE — Progress Notes (Signed)
Reports good results from enema

## 2014-03-02 MED ORDER — LEVOTHYROXINE 50 MCG TAB
50 mcg | ORAL_TABLET | Freq: Every day | ORAL | Status: DC
Start: 2014-03-02 — End: 2015-01-28

## 2014-03-04 LAB — HEPATIC FUNCTION PANEL
ALT (SGPT): 291 IU/L — ABNORMAL HIGH (ref 0–32)
AST (SGOT): 245 IU/L — ABNORMAL HIGH (ref 0–40)
Albumin: 3.7 g/dL (ref 3.5–4.7)
Alk. phosphatase: 358 IU/L — ABNORMAL HIGH (ref 39–117)
Bilirubin, direct: 2.69 mg/dL — ABNORMAL HIGH (ref 0.00–0.40)
Bilirubin, total: 4 mg/dL — ABNORMAL HIGH (ref 0.0–1.2)
Protein, total: 5.7 g/dL — ABNORMAL LOW (ref 6.0–8.5)

## 2014-03-12 ENCOUNTER — Inpatient Hospital Stay
Admit: 2014-03-12 | Discharge: 2014-03-14 | Disposition: A | Discharge status: Home / Self Care | Payer: MEDICARE | Attending: Surgery | Admitting: Surgery

## 2014-03-12 DIAGNOSIS — K8000 Calculus of gallbladder with acute cholecystitis without obstruction: Secondary | ICD-10-CM

## 2014-03-12 LAB — METABOLIC PANEL, COMPREHENSIVE
A-G Ratio: 1.1 — ABNORMAL LOW (ref 1.2–3.5)
ALT (SGPT): 175 U/L — ABNORMAL HIGH (ref 12–65)
AST (SGOT): 104 U/L — ABNORMAL HIGH (ref 15–37)
Albumin: 3.4 g/dL (ref 3.2–4.6)
Alk. phosphatase: 299 U/L — ABNORMAL HIGH (ref 50–136)
Anion gap: 6 mmol/L — ABNORMAL LOW (ref 7–16)
BUN: 14 MG/DL (ref 8–23)
Bilirubin, total: 2.2 MG/DL — ABNORMAL HIGH (ref 0.2–1.1)
CO2: 25 mmol/L (ref 21–32)
Calcium: 9.2 MG/DL (ref 8.3–10.4)
Chloride: 109 mmol/L — ABNORMAL HIGH (ref 98–107)
Creatinine: 0.93 MG/DL (ref 0.6–1.0)
GFR est AA: >60 mL/min/{1.73_m2} (ref >60)
GFR est non-AA: >60 mL/min/{1.73_m2} (ref >60)
Globulin: 3.1 g/dL (ref 2.3–3.5)
Glucose: 95 mg/dL (ref 65–100)
Potassium: 4.1 mmol/L (ref 3.5–5.1)
Protein, total: 6.5 g/dL (ref 6.3–8.2)
Sodium: 140 mmol/L (ref 136–145)

## 2014-03-12 LAB — MAGNESIUM: Magnesium: 2.1 mg/dL (ref 1.8–2.4)

## 2014-03-12 LAB — CBC WITH AUTOMATED DIFF
ABS. BASOPHILS: 0 10*3/uL (ref 0.0–0.2)
ABS. EOSINOPHILS: 0 10*3/uL (ref 0.0–0.8)
ABS. IMM. GRANS.: 0 10*3/uL (ref 0.0–0.5)
ABS. LYMPHOCYTES: 0.8 10*3/uL (ref 0.5–4.6)
ABS. MONOCYTES: 0.4 10*3/uL (ref 0.1–1.3)
ABS. NEUTROPHILS: 2.2 10*3/uL (ref 1.7–8.2)
BASOPHILS: 0 % (ref 0.0–2.0)
EOSINOPHILS: 0 % — ABNORMAL LOW (ref 0.5–7.8)
HCT: 40.5 % (ref 35.8–46.3)
HGB: 13.8 g/dL (ref 11.7–15.4)
IMMATURE GRANULOCYTES: 0.3 % (ref 0.0–5.0)
LYMPHOCYTES: 24 % (ref 13–44)
MCH: 28.6 PG (ref 26.1–32.9)
MCHC: 34.1 g/dL (ref 31.4–35.0)
MCV: 84 FL (ref 79.6–97.8)
MONOCYTES: 12 % (ref 4.0–12.0)
MPV: 9.3 FL — ABNORMAL LOW (ref 10.8–14.1)
NEUTROPHILS: 64 % (ref 43–78)
PLATELET: 289 10*3/uL (ref 150–450)
RBC: 4.82 M/uL (ref 4.05–5.25)
RDW: 14.4 % (ref 11.9–14.6)
WBC: 3.4 10*3/uL — ABNORMAL LOW (ref 4.3–11.1)

## 2014-03-12 LAB — URINE MICROSCOPIC
Casts: 0 /lpf
Crystals, urine: 0 /LPF
RBC: 0 /hpf

## 2014-03-12 LAB — LIPASE: Lipase: 256 U/L (ref 73–393)

## 2014-03-12 LAB — POC TROPONIN: Troponin-I (POC): 0 ng/ml (ref 0.0–0.08)

## 2014-03-12 LAB — AMYLASE: Amylase: 98 U/L (ref 25–115)

## 2014-03-12 LAB — AMMONIA: Ammonia, plasma: <10 umol/L — ABNORMAL LOW (ref 11–32)

## 2014-03-12 MED ORDER — SODIUM CHLORIDE 0.9 % IJ SYRG
Freq: Three times a day (TID) | INTRAMUSCULAR | Status: DC
Start: 2014-03-12 — End: 2014-03-14
  Administered 2014-03-12 – 2014-03-14 (×4): via INTRAVENOUS

## 2014-03-12 MED ORDER — SODIUM CHLORIDE 0.9 % IV
INTRAVENOUS | Status: DC
Start: 2014-03-12 — End: 2014-03-12
  Administered 2014-03-12: 18:00:00 via INTRAVENOUS

## 2014-03-12 MED ORDER — SODIUM CHLORIDE 0.9 % IJ SYRG
INTRAMUSCULAR | Status: DC | PRN
Start: 2014-03-12 — End: 2014-03-14

## 2014-03-12 MED ADMIN — levothyroxine (SYNTHROID) tablet 50 mcg: ORAL | @ 23:00:00 | NDC 00074455211

## 2014-03-12 MED FILL — BUDESONIDE 0.5 MG/2 ML NEB SUSPENSION: 0.5 mg/2 mL | RESPIRATORY_TRACT | Qty: 1

## 2014-03-12 MED FILL — SODIUM CHLORIDE 0.9 % IV: INTRAVENOUS | Qty: 1000

## 2014-03-12 MED FILL — ALBUTEROL SULFATE 0.083 % (0.83 MG/ML) SOLN FOR INHALATION: 2.5 mg /3 mL (0.083 %) | RESPIRATORY_TRACT | Qty: 1

## 2014-03-12 MED FILL — 1/2 NS WITH POTASSIUM CHLORIDE 20 MEQ/L IV: 20 mEq/L | INTRAVENOUS | Qty: 1000

## 2014-03-12 MED FILL — LEVOTHROID 50 MCG TABLET: 50 mcg | ORAL | Qty: 1

## 2014-03-12 NOTE — Consults (Addendum)
[facility] Consult Note       Referring Physician:  Dr. Pascal Lux    Consult Date:  03/12/2014    Admit Date:  03/12/2014    Chief Complaint:  Increasing fatigue, nausea    Subjective:     History of Present Illness:  Patient is a 80 y.o. female with PMH of anemia, CM, HH, gastritis, COPD, GERD, dementia, hypothyroidism, neuroendocrine tumors of stomach, pernicious anemia, who is seen in consultation at the request of Dr. Pascal Lux for increasing fatigue, nausea.  She was seen in the office on 11 March 2014 for abdominal pain, jaundice, and weakness.  She had recently been seen a [facility] from 4-28 February 2014 for elevated LFTs with CBD stones.  She underwent ERCP with sphincterotomy, stone extraction, and ampullary diverticulum noted.  Since discharge, she has had increased fatigue and vomited on Monday.  She had tolerated a clear liquid diet, but has increased vomiting and abdominal pain after increasing to solid food.  She did have constipation last week, passing only small amounts of hard stool.  LFTs has been trending down since the ERCP, but given continued elevation and persistent symptoms, repeat US was obtained to assess for any retained stones (MRCP was initially considered, but her family stated the pt was claustrophobic).      ERCP 27 February 2014 with Dr. Orpah Melter with FINDINGS: AMPULLA: large, located in a diverticulum BILE DUCT: bile duct cannulated easily with a 44 sphincterotome over a 0.035 wire. Distal cholangiogram revealed 3 large distal CBD stones and a 12 mm bile duct. A moderate sphincterotomy was performed, cutting along the intraduodenal segment of the ampulla. A balloon sweep removed the 3 stones as well as biliary sludge. Multiple sweeps were performed, removing additional sludge. There was an area of angulation that appeared mildly stenotic on cholangiogram of the distal bile duct. The 12 mm balloon was able to pass with only mild resistance. This probably represented a change in angle of the bile  duct where it entered the duodenal diverticulum, but liver enzymes should be followed in the future, and MRCP or EUS may be considered. A cholangiogram was then obtained and demonstrated a clear duct.   PANCREATIC DUCT: not cannulated or injected ASSESSMENT/PLAN: 1. Return to floor 2. Low fat diet 3. DC tomorrow if feels well 4. Follow LFTs as outpatient, may consider MRCP/EUS to look at distal duct in the future 5. Surgery consult    Labs 09 March 2014: WBC 3.8, hgb 14.4, hct 43.2, MCV 84.8, plts 370.  CMP normal except Cl 19, albumin 3.3, alk phos 315, ALT 301, AST 241, total bili 3.1.    Labs 11 March 2014: WBC 4.1, hgb 14.8, hct 46.1, MCV 87.5, plts 401.  CMP normal except glucose 113, alk phos 302, ALT 166, AST 109, total bili 2.8.  Lipase 44.    [place] RUQ 12 March 2014 at our office: mobile stones/sludge balls in GB, GB is thick wall at 3.9 mm, also tender over GB when scanning suggesting positive Murphy's sign, findings suggest cholecystitis which can be correlated clinically.  Small pericardiac effusion was also noted.  CBD was 6 mm.    [place] was reviewed by the office today and she was sent to the ER for further evaluation.  Per her son and daughter who are at the bedside, she had persistent weakness and decreased po intake.  She had 2 episodes of nausea and 1 episode of vomiting since her discharge.  She has noticed mild RUQ pain,  but not much.  She denies any heartburn, chest pain, chronic cough, hoarseness, or difficulty swallowing.  She has had constipation recently with a BM daily of only small harder stools.  She has not had any bloody or black stools or emesis.  She has not had any fever or chills.  Her eyes have appeared more clear and her urine is getting lighter in color.    From prior hospitalization:   Abdominal ultrasound, 02/26/2014   History: Right upper quadrant pain, nausea, vomiting, and jaundice.   Comparison: None.   Findings: The liver is normal in size measuring 13.8 cm. Intrahepatic biliary  dilation is seen. Liver echogenicity is normal. The gallbladder contains no shadowing gallstones although echogenic sludge is seen. The gallbladder has a hypoechoic appearance. The wall is not thickened. The common bile duct is dilated measuring 15 mm. A sonographic Percell Miller sign is present.  The pancreas is partially visualized. The partially visualized pancreatic head, and body demonstrates no gross abnormality. The remainder of the pancreas is non-visualized and therefore cannot be evaluated.  The right kidney measures 10.3 cm in length. No shadowing stones, or hydronephrosis is seen of the right kidney.  The IVC is patent. The distal abdominal aorta is normal in caliber measuring 1.8 cm. The non-visualized portions of the aorta cannot be evaluated.  IMPRESSION:   1. Hydropic appearance of the gallbladder, and positive sonographic Murphy sign. Cholecystitis is not excluded.  2. Intra-and extra hepatic biliary ductal dilation of uncertain etiology given the lack of visualized stones within the gallbladder. The pancreatic head is only partially visualized the most distal aspect of the common bile duct is not visualized.     PMH:  Past Medical History   Diagnosis Date   ??? Anemia    ??? Arthritis    ??? Cardiomegaly      d/t age;  asymptomatic   ??? HH (hiatus hernia)    ??? Osteoarthritis    ??? Gastritis    ??? Anemia, pernicious      since childhood   ??? Hiatal hernia    ??? Chronic obstructive pulmonary disease (HCC)      peer xray; asymptomatic; very mild/ no treatment   ??? GERD (gastroesophageal reflux disease)    ??? Thyroid disease      hypothyroid   ??? 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 Depression    ??? Dementia      with short term memory loss-needs assist with ADL's     EGD 16 Oct 2013 with Dr. Mare Ferrari with POSTOPERATIVE DIAGNOSES: 1. Small hiatal hernia. 2. Diminutive gastric polyps. 3. Otherwise unremarkable appearing stomach. Biopsies  obtained. RECOMMENDATIONS:1. Follow up pathology results. 2. Continue Zantac and avoid PPI therapy if possible. 3. Follow up in the office with Dr. Imelda Pillow for further recommendations. Based on the patient's age and comorbidities, consider repeating endoscopy on an as needed basis.    PSH:  Past Surgical History   Procedure Laterality Date   ??? Hx endoscopy     ??? Hx tonsillectomy     ??? Hx appendectomy     ??? Hx orthopaedic       right hip replacement     Allergies:  Allergies   Allergen Reactions   ??? Codeine Nausea Only     Home Medications:  Prior to Admission medications    Medication Sig Start Date End Date Taking?  Authorizing Provider   hydrOXYzine (ATARAX) 25 mg tablet Take  by mouth three (3) times daily as needed for Itching.   Yes Phys Other, MD   levothyroxine (SYNTHROID) 50 mcg tablet Take 1 Tab by mouth Daily (before breakfast). 03/02/14  Yes Zenovia Jarred, MD   PARoxetine (PAXIL) 20 mg tablet Take 1 Tab by mouth daily. 02/25/14  Yes Zenovia Jarred, MD   estradiol (ESTRACE) 0.01 % (0.1 mg/gram) vaginal cream 1 gram vaginally 3x/week 12/18/13  Yes Lucilla Edin, NP   oxazepam [facility]) 10 mg capsule Take 1 Cap by mouth nightly as needed for Sleep or Anxiety. Max Daily Amount: 10 mg. 12/02/13  Yes Zenovia Jarred, MD   pantoprazole (PROTONIX) 40 mg tablet Take 1 Tab by mouth daily. 11/25/13  Yes Zenovia Jarred, MD   docusate sodium (COLACE) 100 mg capsule Take 100 mg by mouth every morning.   Yes Historical Provider   calcium carbonate (CALTREX) 600 mg (1,500 mg) tablet Take 600 mg by mouth daily.   Yes Historical Provider   MULTIVITAMIN WITH MINERALS (ONE-A-DAY 50 PLUS PO) Take  by mouth. Last dose 10/10/13   Yes Historical Provider   diphenhydrAMINE (BENADRYL) 25 mg capsule Take 25 mg by mouth every six (6) hours as needed.    Historical Provider   oxazepam (SERAX) 15 mg capsule  10/27/13   Historical Provider   BD LUER-LOK SYRINGE 3 mL 25 x 1" syrg  12/03/13   Historical Provider   cyanocobalamin  (VITAMIN B12) 1,000 mcg/mL injection 1 mL by IntraMUSCular route every thirty (30) days. 12/03/13   Zenovia Jarred, MD   ergocalciferol (VITAMIN D2) 50,000 unit capsule Take 50,000 Units by mouth every thirty (30) days.    Historical Provider   vitamin E (AQUA GEMS) 400 unit capsule Take  by mouth daily. Last dose 10/10/13    Historical Provider   budesonide-formoterol (SYMBICORT) 160-4.5 mcg/actuation HFA inhaler Take 2 puffs by inhalation as needed. Does not use    Historical Provider     Hospital Medications:  Current Facility-Administered Medications   Medication Dose Route Frequency   ??? sodium chloride (NS) flush 5-10 mL  5-10 mL IntraVENous Q8H   ??? sodium chloride (NS) flush 5-10 mL  5-10 mL IntraVENous PRN   ??? 0.9% sodium chloride infusion  150 mL/hr IntraVENous CONTINUOUS     Current Outpatient Prescriptions   Medication Sig   ??? hydrOXYzine (ATARAX) 25 mg tablet Take  by mouth three (3) times daily as needed for Itching.   ??? levothyroxine (SYNTHROID) 50 mcg tablet Take 1 Tab by mouth Daily (before breakfast).   ??? PARoxetine (PAXIL) 20 mg tablet Take 1 Tab by mouth daily.   ??? estradiol (ESTRACE) 0.01 % (0.1 mg/gram) vaginal cream 1 gram vaginally 3x/week   ??? oxazepam (SERAX) 10 mg capsule Take 1 Cap by mouth nightly as needed for Sleep or Anxiety. Max Daily Amount: 10 mg.   ??? pantoprazole (PROTONIX) 40 mg tablet Take 1 Tab by mouth daily.   ??? docusate sodium (COLACE) 100 mg capsule Take 100 mg by mouth every morning.   ??? calcium carbonate (CALTREX) 600 mg (1,500 mg) tablet Take 600 mg by mouth daily.   ??? MULTIVITAMIN WITH MINERALS (ONE-A-DAY 50 PLUS PO) Take  by mouth. Last dose 10/10/13   ??? diphenhydrAMINE (BENADRYL) 25 mg capsule Take 25 mg by mouth every six (6) hours as needed.   ??? oxazepam (SERAX) 15 mg capsule    ???  BD LUER-LOK SYRINGE 3 mL 25 x 1" syrg    ??? cyanocobalamin (VITAMIN B12) 1,000 mcg/mL injection 1 mL by IntraMUSCular route every thirty (30) days.   ??? ergocalciferol (VITAMIN D2) 50,000  unit capsule Take 50,000 Units by mouth every thirty (30) days.   ??? vitamin E (AQUA GEMS) 400 unit capsule Take  by mouth daily. Last dose 10/10/13   ??? budesonide-formoterol (SYMBICORT) 160-4.5 mcg/actuation HFA inhaler Take 2 puffs by inhalation as needed. Does not use     Social History:  History   Substance Use Topics   ??? Smoking status: Never Smoker    ??? Smokeless tobacco: Not on file   ??? Alcohol Use: Yes      Comment: social     Pt denies any history of IV drug use.    Family History:  No pertinent family history.    Review of Systems:  A detailed 10 system ROS is obtained, with pertinent positives as listed above.  All others are negative.    Objective:     Physical Exam:  Vitals:  BP 101/60 mmHg   Pulse 88   Temp(Src) 97.8 ??F (36.6 ??Palmina Clodfelter)   Resp 18   Ht '5\' 7"'  (1.702 m)   Wt 68.04 kg (150 lb)   BMI 23.49 kg/m2   SpO2 95%  Gen:  Pt is alert, cooperative, no acute distress, lying in bed  Skin:  Extremities and face reveal no rashes.   HEENT: Sclerae anicteric.  Extra-occular muscles are intact.  No oral ulcers.  No abnormal pigmentation of the lips.  The neck is supple.  Cardiovascular: Regular rate and rhythm. No murmurs, gallops, or rubs.  Respiratory:  Comfortable breathing with no accessory muscle use. Clear breath sounds anteriorly with no wheezes, rales, or rhonchi.  GI:  Abdomen nondistended, soft, and nontender.  Normal active bowel sounds. No enlargement of the liver or spleen. No masses palpable.  Rectal:  Deferred  Musculoskeletal:  No pitting edema of the lower legs.    Neurological:  Patient is alert, but has dementia (gave her maiden name and was unable to recall the location and date, but knew she was at the hospital.  Psychiatric:  Mood appears appropriate with judgement intact.  Lymphatic:  No cervical or supraclavicular adenopathy.    Laboratory:    Recent Labs      03/12/14   1343   WBC  3.4*   HGB  13.8   HCT  40.5   PLT  289   MCV  84.0   NA  140   K  4.1   CL  109*   CO2  25   BUN  14   CREA   0.93   CA  9.2   MG  2.1   GLU  95   AP  299*   SGOT  104*   ALT  175*   TBILI  2.2*   ALB  3.4   TP  6.5   AML  98   LPSE  256      Ref. Range 03/12/2014 13:42   Troponin-I (POC) Latest Range: 0.0-0.08 ng/ml 0      Ref. Range 03/12/2014 13:43   Ammonia Latest Range: 11-32 UMOL/L <10 (L)     Assessment:     Active Problems:  Weakness  Cholelithiasis  Cholecystitis on [place] RUQ  S/p biliary obstruction  Elevated LFTs    80 yo female with PMH of anemia, CM, HH, gastritis, COPD, GERD, dementia, hypothyroidism,  neuroendocrine tumors of stomach, pernicious anemia, who is seen in consultation at the request of Dr. Pascal Lux for increasing fatigue, nausea. She was admitted 4-28 February 2014 for biliary obstruction and underwent an ERCP with stones removed and sphincterotomy performed.  She has had improving total bilirubin, but LFTs have remained elevated, though decreased overall, and she continues to have weakness, decreased po intake, and off and on nausea and vomiting.  She has also had some abdominal pain, though not severe.  With total bilirubin improving, no leukocytosis, and CBD now 6 mm per [place] Abd today, she does not appear to need a repeat ERCP at this time.  [place] was significant for gallstones and sludge, as well as cholecystitis.      Plan:     Further surgery evaluation recommended.      Patient is seen and examined in collaboration with Dr. Noel Gerold.  Assessment and plan as per Dr. Cari Caraway.    Sabrina S. Raja, PA-Mickenzie Stolar    Bile duct has been cleared  Discussed with Dr. Domenica Fail- further plans per surgery- if lap chole is performed would appreciate IOC for confirmation of cleared duct    PT SEEN AND EXAMINED AND PLAN DISCUSSED AND IMPLEMENTED.  Llana Aliment, MD

## 2014-03-12 NOTE — Progress Notes (Signed)
TRANSFER - IN REPORT:    No report received, per hospital policy, on Jo Simmons  being received from ED for routine progression of care      Report consisted of patient???s Situation, Background, Assessment and   Recommendations(SBAR).     Information from the following report(s) SBAR, Kardex, ED Summary, Intake/Output, MAR and Recent Results was reviewed with the receiving nurse.    Opportunity for questions and clarification was provided.      Assessment completed upon patient???s arrival to unit and care assumed.

## 2014-03-12 NOTE — ED Notes (Signed)
Secretary spoke with Dr. Domenica Fail.  Dr. Domenica Fail states he will be in.

## 2014-03-12 NOTE — H&P (Signed)
[place] [address]  [phone]     History and Physical/Surgical Consult   [place] date: 03/12/2014    MRN: [id number]     DOB: [birth year]     Age: 80 y.o.          03/12/2014 5:42 PM    Subjective/HPI:   This patient is a 80 y.o. seen and evaluated at the request of Dr. Diamantina Providence.  Pt recently had ercp with clearance of 3 large gallstones from the CBD.  She was discharged home.  Her family reports she has not done well and has no energy, feels poorly, and does not have any appetitie.  She comes in today due to some pain in RUQ.  Family had not noted any fevers, chills, or jaundice since the ercp.  Pt has baseline dementia and most history was given by daughter.     Review of Systems  Review of systems not obtained due to patient factors.  Past Medical History   Diagnosis Date   ??? Anemia    ??? Arthritis    ??? Cardiomegaly      d/t age;  asymptomatic   ??? HH (hiatus hernia)    ??? Osteoarthritis    ??? Gastritis    ??? Anemia, pernicious      since childhood   ??? Hiatal hernia    ??? Chronic obstructive pulmonary disease (HCC)      peer xray; asymptomatic; very mild/ no treatment   ??? GERD (gastroesophageal reflux disease)    ??? Thyroid disease      hypothyroid   ??? 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 Depression    ??? Dementia      with short term memory loss-needs assist with ADL's      Past Surgical History   Procedure Laterality Date   ??? Hx endoscopy     ??? Hx tonsillectomy     ??? Hx appendectomy     ??? Hx orthopaedic       right hip replacement      Allergies   Allergen Reactions   ??? Codeine Nausea Only      History   Substance Use Topics   ??? Smoking status: Never Smoker    ??? Smokeless tobacco: Not on file   ??? Alcohol Use: Yes      Comment: social      History     Social History Narrative     History reviewed. No pertinent family history.   Prior to Admission Medications    Prescriptions Last Dose Informant Patient Reported? Taking?   BD LUER-LOK SYRINGE 3 mL 25 x 1" syrg Not Taking at Unknown time  Yes No   MULTIVITAMIN WITH MINERALS (ONE-A-DAY 50 PLUS PO)   Yes Yes   Sig: Take  by mouth. Last dose 10/10/13   PARoxetine (PAXIL) 20 mg tablet   No Yes   Sig: Take 1 Tab by mouth daily.   budesonide-formoterol (SYMBICORT) 160-4.5 mcg/actuation HFA inhaler Not Taking at Unknown time  Yes No   Sig: Take 2 puffs by inhalation as needed. Does not use   calcium carbonate (CALTREX) 600 mg (1,500 mg) tablet   Yes Yes   Sig: Take 600 mg by mouth daily.   cyanocobalamin (VITAMIN B12) 1,000 mcg/mL injection Not Taking at  Unknown time  No No   Sig: 1 mL by IntraMUSCular route every thirty (30) days.   diphenhydrAMINE (BENADRYL) 25 mg capsule Not Taking at Unknown time  Yes No   Sig: Take 25 mg by mouth every six (6) hours as needed.   docusate sodium (COLACE) 100 mg capsule   Yes Yes   Sig: Take 100 mg by mouth every morning.   ergocalciferol (VITAMIN D2) 50,000 unit capsule Not Taking at Unknown time  Yes No   Sig: Take 50,000 Units by mouth every thirty (30) days.   estradiol (ESTRACE) 0.01 % (0.1 mg/gram) vaginal cream   No Yes   Sig: 1 gram vaginally 3x/week   hydrOXYzine (ATARAX) 25 mg tablet   Yes Yes   Sig: Take  by mouth three (3) times daily as needed for Itching.   levothyroxine (SYNTHROID) 50 mcg tablet   No Yes   Sig: Take 1 Tab by mouth Daily (before breakfast).   oxazepam (SERAX) 10 mg capsule   No Yes   Sig: Take 1 Cap by mouth nightly as needed for Sleep or Anxiety. Max Daily Amount: 10 mg.   oxazepam (SERAX) 15 mg capsule Not Taking at Unknown time  Yes No   pantoprazole (PROTONIX) 40 mg tablet   No Yes   Sig: Take 1 Tab by mouth daily.   vitamin E (AQUA GEMS) 400 unit capsule Not Taking at Unknown time  Yes No   Sig: Take  by mouth daily. Last dose 10/10/13      Facility-Administered Medications: None     Current Facility-Administered Medications   Medication Dose Route Frequency    ??? sodium chloride (NS) flush 5-10 mL  5-10 mL IntraVENous Q8H   ??? sodium chloride (NS) flush 5-10 mL  5-10 mL IntraVENous PRN   ??? 0.9% sodium chloride infusion  150 mL/hr IntraVENous CONTINUOUS     Current Outpatient Prescriptions   Medication Sig   ??? hydrOXYzine (ATARAX) 25 mg tablet Take  by mouth three (3) times daily as needed for Itching.   ??? levothyroxine (SYNTHROID) 50 mcg tablet Take 1 Tab by mouth Daily (before breakfast).   ??? PARoxetine (PAXIL) 20 mg tablet Take 1 Tab by mouth daily.   ??? estradiol (ESTRACE) 0.01 % (0.1 mg/gram) vaginal cream 1 gram vaginally 3x/week   ??? oxazepam (SERAX) 10 mg capsule Take 1 Cap by mouth nightly as needed for Sleep or Anxiety. Max Daily Amount: 10 mg.   ??? pantoprazole (PROTONIX) 40 mg tablet Take 1 Tab by mouth daily.   ??? docusate sodium (COLACE) 100 mg capsule Take 100 mg by mouth every morning.   ??? calcium carbonate (CALTREX) 600 mg (1,500 mg) tablet Take 600 mg by mouth daily.   ??? MULTIVITAMIN WITH MINERALS (ONE-A-DAY 50 PLUS PO) Take  by mouth. Last dose 10/10/13   ??? diphenhydrAMINE (BENADRYL) 25 mg capsule Take 25 mg by mouth every six (6) hours as needed.   ??? oxazepam (SERAX) 15 mg capsule    ??? BD LUER-LOK SYRINGE 3 mL 25 x 1" syrg    ??? cyanocobalamin (VITAMIN B12) 1,000 mcg/mL injection 1 mL by IntraMUSCular route every thirty (30) days.   ??? ergocalciferol (VITAMIN D2) 50,000 unit capsule Take 50,000 Units by mouth every thirty (30) days.   ??? vitamin E (AQUA GEMS) 400 unit capsule Take  by mouth daily. Last dose 10/10/13   ??? budesonide-formoterol (SYMBICORT) 160-4.5 mcg/actuation HFA inhaler Take 2 puffs by inhalation as needed. Does not use  Objective:     Filed Vitals:    03/12/14 1613 03/12/14 1628 03/12/14 1643 03/12/14 1658   BP: 149/67 145/70 179/85 170/83   Pulse: 59 65 64 65   Temp:       Resp: 25 26 26 28    Height:       Weight:       SpO2: 96% 97% 98% 97%           Physical Exam:   Gen- the patient is well developed and in no acute distress  HEENT- PERRL,  EOMI, no scleral icterus       nose without alar flaring or epistaxis                  oral muscosa moist without cyanosis  Neck- no JVD or retractions  Lungs- resp even/unlab   Heart- RRR   Abd- soft, tender to palpation in ruq without rebound or guarding. No murphy's sign  Ext- warm without cyanosis. There is no lower leg edema.  Skin- no jaundice or rashes  Neuro- alert and oriented x 3. No gross sensorimotor deficits are present.     Data Review   Recent Labs      03/12/14   1343   WBC  3.4*   HGB  13.8   HCT  40.5   PLT  289     Recent Labs      03/12/14   1343   NA  140   K  4.1   CL  109*   CO2  25   GLU  95   BUN  14   CREA  0.93   MG  2.1       Assessment:     Hospital Problems Date Reviewed: 02/27/2014        ICD-9-CM Class Noted POA    Cholecystitis 575.10  03/12/2014 Unknown            Plan:     Recommend cholecystectomy, pt most likely has cholecystitis based on LFT's and [place] results  Admit for IVF hydration and abx.  Surgery to be scheduled     Con Memos, MD

## 2014-03-12 NOTE — ED Notes (Signed)
Patient transferred to 2nd floor, receiving RN given opportunity to review chart and call with questions.

## 2014-03-12 NOTE — ED Notes (Signed)
Had ultrasound this AM.  States a thickening to the gallbladder.  Reports discolored urine. States increasing weakness.

## 2014-03-12 NOTE — Progress Notes (Signed)
Primary Nurse Fraser Din. Roda Shutters, RN and Freddi Che, RN performed a dual skin assessment on this patient. No impairment noted.  Braden score is 22.

## 2014-03-12 NOTE — Anesthesia Pre-Procedure Evaluation (Addendum)
Anesthetic History   No history of anesthetic complications           Review of Systems / Medical History  Patient summary reviewed and pertinent labs reviewed    Pulmonary                 Neuro/Psych         Psychiatric history and dementia (moderate, oriented to person only)     Cardiovascular                Exercise tolerance: <4 METS     GI/Hepatic/Renal                  Endo/Other      Hypothyroidism: well controlled       Other Findings   Comments: Family denies COPD, mild dementia, TMJ--mandible disarticulation  c each of last anesthetics... 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

## 2014-03-12 NOTE — ED Provider Notes (Signed)
HPI Comments: Pt has Hx of dementia making Hx and ROS difficult.  Hx obtained from family. Pt has had elevated liver enzymes and inflamed gall bladder.  She had an ERCP with removal of common duct stones last week.  Family reports pt is increasingly fatigued with nausea and eyes are turning yellow. She was seen in the gastroenterology office yesterday and had an [place] today which showed in "inflammed gallbladder" per Pt's family.  Lab results and [place] reports are being faxed for review.  Pt denies pain. Family also report decreased PO intake.    Patient is a 80 y.o. female presenting with fatigue. The history is provided by the patient and a relative.   Fatigue  This is a new problem. The current episode started more than 2 days ago. The problem has been gradually worsening. There was no focality noted. Primary symptoms include mental status change.Pertinent negatives include no focal weakness, no loss of sensation, no loss of balance, no slurred speech, no speech difficulty, no memory loss, no movement disorder, no agitation, no visual change, no auditory change, no unresponsiveness and no disorientation. There has been no fever. Associated symptoms include nausea. Pertinent negatives include no shortness of breath, no chest pain, no vomiting, no altered mental status, no confusion, no headaches, no choking, no bowel incontinence and no bladder incontinence. There were no medications administered prior to arrival.        Past Medical History   Diagnosis Date   ??? Anemia    ??? Arthritis    ??? Cardiomegaly      d/t age;  asymptomatic   ??? HH (hiatus hernia)    ??? Osteoarthritis    ??? Gastritis    ??? Anemia, pernicious      since childhood   ??? Hiatal hernia    ??? Chronic obstructive pulmonary disease (HCC)      peer xray; asymptomatic; very mild/ no treatment   ??? GERD (gastroesophageal reflux disease)    ??? Thyroid disease      hypothyroid   ??? 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 Prolapsed urethral  mucosa(599.5)      occasional UTI   ??? Depression    ??? Dementia      with short term memory loss-needs assist with ADL's        Past Surgical History   Procedure Laterality Date   ??? Hx endoscopy     ??? Hx tonsillectomy     ??? Hx appendectomy     ??? Hx orthopaedic       right hip replacement         History reviewed. No pertinent family history.     History     Social History   ??? Marital Status: WIDOWED     Spouse Name: N/A     Number of Children: N/A   ??? Years of Education: N/A     Occupational History   ??? Not on file.     Social History Main Topics   ??? Smoking status: Never Smoker    ??? Smokeless tobacco: Not on file   ??? Alcohol Use: Yes      Comment: social   ??? Drug Use: No   ??? Sexual Activity: No     Other Topics Concern   ??? Not on file     Social History Narrative                  ALLERGIES:  Codeine      Review of Systems   Constitutional: Positive for fatigue.   Respiratory: Negative for choking and shortness of breath.    Cardiovascular: Negative for chest pain.   Gastrointestinal: Positive for nausea. Negative for vomiting and bowel incontinence.   Genitourinary: Negative for bladder incontinence.   Neurological: Negative for focal weakness, speech difficulty, headaches and loss of balance.   Psychiatric/Behavioral: Negative for memory loss, confusion and agitation.   All other systems reviewed and are negative.      Filed Vitals:    03/12/14 1212   BP: 101/60   Pulse: 88   Temp: 97.8 ??F (36.6 ??C)   Resp: 18   Height: 5\' 7"  (1.702 m)   Weight: 68.04 kg (150 lb)   SpO2: 95%            Physical Exam   Constitutional: She is oriented to person, place, and time. She appears well-developed and well-nourished. No distress.   HENT:   Head: Normocephalic and atraumatic.   Eyes: Conjunctivae are normal. Pupils are equal, round, and reactive to light. Scleral icterus is present.   Neck: Normal range of motion. Neck supple.   Cardiovascular: Normal rate, regular rhythm and normal heart sounds.    Pulmonary/Chest: Effort  normal and breath sounds normal.   Abdominal: Soft. Bowel sounds are normal.   Musculoskeletal: Normal range of motion. She exhibits no edema or tenderness.   Lymphadenopathy:     She has no cervical adenopathy.   Neurological: She is alert and oriented to person, place, and time.   Skin: Skin is warm and dry.   Psychiatric: She has a normal mood and affect. Her behavior is normal.   Nursing note and vitals reviewed.       MDM  Number of Diagnoses or Management Options     Amount and/or Complexity of Data Reviewed  Clinical lab tests: ordered and reviewed  Tests in the radiology section of CPT??: reviewed  Review and summarize past medical records: yes  Discuss the patient with other providers: yes  Independent visualization of images, tracings, or specimens: yes    Risk of Complications, Morbidity, and/or Mortality  Presenting problems: high  Diagnostic procedures: high  Management options: moderate    Patient Progress  Patient progress: stable      Procedures

## 2014-03-13 LAB — EKG, 12 LEAD, INITIAL
Atrial Rate: 60 {beats}/min
Calculated P Axis: 49 degrees
Calculated R Axis: 77 degrees
Calculated T Axis: 62 degrees
P-R Interval: 166 ms
Q-T Interval: 444 ms
QRS Duration: 88 ms
QTC Calculation (Bezet): 444 ms
Ventricular Rate: 60 {beats}/min

## 2014-03-13 MED ORDER — LACTATED RINGERS IV
INTRAVENOUS | Status: DC
Start: 2014-03-13 — End: 2014-03-13
  Administered 2014-03-13: 13:00:00 via INTRAVENOUS

## 2014-03-13 MED ORDER — HYDROMORPHONE (PF) 2 MG/ML IJ SOLN
2 mg/mL | INTRAMUSCULAR | Status: DC | PRN
Start: 2014-03-13 — End: 2014-03-13
  Administered 2014-03-13: 13:00:00 via INTRAVENOUS

## 2014-03-13 MED ORDER — PROMETHAZINE 25 MG/ML INJECTION
25 mg/mL | INTRAMUSCULAR | Status: DC | PRN
Start: 2014-03-13 — End: 2014-03-13

## 2014-03-13 MED ORDER — OXYCODONE 5 MG TAB
5 mg | ORAL | Status: DC | PRN
Start: 2014-03-13 — End: 2014-03-13

## 2014-03-13 MED ADMIN — ondansetron (ZOFRAN) injection: INTRAVENOUS | @ 12:00:00 | NDC 00781301072

## 2014-03-13 MED ADMIN — fentaNYL citrate (PF) injection: INTRAVENOUS | @ 11:00:00 | NDC 00409909422

## 2014-03-13 MED ADMIN — PARoxetine (PAXIL) tablet 20 mg: ORAL | @ 17:00:00 | NDC 68084004511

## 2014-03-13 MED ADMIN — succinylcholine (ANECTINE) injection: INTRAVENOUS | @ 11:00:00 | NDC 00409662902

## 2014-03-13 MED ADMIN — docusate sodium (COLACE) capsule 100 mg: ORAL | @ 17:00:00 | NDC 62584068311

## 2014-03-13 MED ADMIN — bupivacaine (PF) (MARCAINE) 0.5 % (5 mg/mL) injection: SUBCUTANEOUS | @ 12:00:00 | NDC 63323046637

## 2014-03-13 MED ADMIN — fentaNYL citrate (PF) injection: INTRAVENOUS | @ 12:00:00 | NDC 00409909422

## 2014-03-13 MED ADMIN — levothyroxine (SYNTHROID) tablet 50 mcg: ORAL | @ 22:00:00 | NDC 00074455211

## 2014-03-13 MED ADMIN — propofol (DIPRIVAN) 10 mg/mL injection: INTRAVENOUS | @ 12:00:00 | NDC 63323027025

## 2014-03-13 MED ADMIN — piperacillin-tazobactam (ZOSYN) 4.5 g in 0.9% sodium chloride (MBP/ADV) 100 mL ADV: INTRAVENOUS | @ 17:00:00 | NDC 00409710167

## 2014-03-13 MED ADMIN — lactated ringers infusion: INTRAVENOUS | @ 11:00:00 | NDC 00409795309

## 2014-03-13 MED ADMIN — ePHEDrine (MISTOLE) 50 mg/mL injection: INTRAVENOUS | @ 12:00:00 | NDC 17478051500

## 2014-03-13 MED ADMIN — glycopyrrolate (ROBINUL) injection: INTRAVENOUS | @ 12:00:00 | NDC 00143968101

## 2014-03-13 MED ADMIN — rocuronium (ZEMURON) injection: INTRAVENOUS | @ 12:00:00 | NDC 67457022810

## 2014-03-13 MED ADMIN — midazolam (VERSED) injection 2 mg: INTRAVENOUS | @ 10:00:00 | NDC 66758001802

## 2014-03-13 MED ADMIN — pantoprazole (PROTONIX) tablet 40 mg: ORAL | @ 17:00:00 | NDC 51079005101

## 2014-03-13 MED ADMIN — lactated ringers infusion: INTRAVENOUS | @ 12:00:00 | NDC 00409795309

## 2014-03-13 MED ADMIN — lidocaine (PF) (XYLOCAINE) 20 mg/mL (2 %) injection: INTRAVENOUS | @ 11:00:00 | NDC 00409428202

## 2014-03-13 MED ADMIN — rocuronium (ZEMURON) injection: INTRAVENOUS | @ 11:00:00 | NDC 67457022810

## 2014-03-13 MED ADMIN — budesonide (PULMICORT) 500 mcg/2 ml nebulizer suspension: RESPIRATORY_TRACT | @ 12:00:00 | NDC 76282064138

## 2014-03-13 MED ADMIN — propofol (DIPRIVAN) 10 mg/mL injection: INTRAVENOUS | @ 11:00:00 | NDC 63323027025

## 2014-03-13 MED ADMIN — piperacillin-tazobactam (ZOSYN) 4.5 g in 0.9% sodium chloride (MBP/ADV) 100 mL ADV: INTRAVENOUS | @ 02:00:00 | NDC 00409710167

## 2014-03-13 MED ADMIN — neostigmine (PROSTIGMINE) injection: INTRAVENOUS | @ 12:00:00 | NDC 76014000310

## 2014-03-13 MED ADMIN — piperacillin-tazobactam (ZOSYN) 4.5 g in 0.9% sodium chloride (MBP/ADV) 100 mL ADV: INTRAVENOUS | @ 12:00:00 | NDC 00409710167

## 2014-03-13 MED FILL — LACTATED RINGERS IV: INTRAVENOUS | Qty: 1000

## 2014-03-13 MED FILL — PROPOFOL 10 MG/ML IV EMUL: 10 mg/mL | INTRAVENOUS | Qty: 20

## 2014-03-13 MED FILL — PIPERACILLIN-TAZOBACTAM 4.5 GRAM IV SOLR: 4.5 gram | INTRAVENOUS | Qty: 4.5

## 2014-03-13 MED FILL — NEOSTIGMINE METHYLSULFATE 1 MG/ML INJECTION: 1 mg/mL | INTRAMUSCULAR | Qty: 4

## 2014-03-13 MED FILL — LIDOCAINE (PF) 20 MG/ML (2 %) IV SYRINGE: 100 mg/5 mL (2 %) | INTRAVENOUS | Qty: 5

## 2014-03-13 MED FILL — LEVOTHROID 50 MCG TABLET: 50 mcg | ORAL | Qty: 1

## 2014-03-13 MED FILL — ZOSYN 4.5 GRAM INTRAVENOUS SOLUTION: 4.5 gram | INTRAVENOUS | Qty: 4.5

## 2014-03-13 MED FILL — LIDOCAINE (PF) 20 MG/ML (2 %) IJ SOLN: 20 mg/mL (2 %) | INTRAMUSCULAR | Qty: 80

## 2014-03-13 MED FILL — EPHEDRINE SULFATE 50 MG/ML IJ SOLN: 50 mg/mL | INTRAMUSCULAR | Qty: 1

## 2014-03-13 MED FILL — ONDANSETRON (PF) 4 MG/2 ML INJECTION: 4 mg/2 mL | INTRAMUSCULAR | Qty: 2

## 2014-03-13 MED FILL — ROCURONIUM 10 MG/ML IV: 10 mg/mL | INTRAVENOUS | Qty: 25

## 2014-03-13 MED FILL — EPHEDRINE SULFATE 50 MG/ML IJ SOLN: 50 mg/mL | INTRAMUSCULAR | Qty: 5

## 2014-03-13 MED FILL — HYDROMORPHONE (PF) 2 MG/ML IJ SOLN: 2 mg/mL | INTRAMUSCULAR | Qty: 1

## 2014-03-13 MED FILL — 1/2 NS WITH POTASSIUM CHLORIDE 20 MEQ/L IV: 20 mEq/L | INTRAVENOUS | Qty: 1000

## 2014-03-13 MED FILL — PANTOPRAZOLE 40 MG TAB, DELAYED RELEASE: 40 mg | ORAL | Qty: 1

## 2014-03-13 MED FILL — ROCURONIUM 10 MG/ML IV: 10 mg/mL | INTRAVENOUS | Qty: 5

## 2014-03-13 MED FILL — QUELICIN 20 MG/ML INJECTION SOLUTION: 20 mg/mL | INTRAMUSCULAR | Qty: 120

## 2014-03-13 MED FILL — SODIUM CHLORIDE 0.9 % INJECTION: INTRAMUSCULAR | Qty: 10

## 2014-03-13 MED FILL — GLYCOPYRROLATE 0.2 MG/ML IJ SOLN: 0.2 mg/mL | INTRAMUSCULAR | Qty: 0.5

## 2014-03-13 MED FILL — FENTANYL CITRATE (PF) 50 MCG/ML IJ SOLN: 50 mcg/mL | INTRAMUSCULAR | Qty: 2

## 2014-03-13 MED FILL — PAROXETINE 20 MG TAB: 20 mg | ORAL | Qty: 1

## 2014-03-13 MED FILL — SENSORCAINE-MPF 0.5 % (5 MG/ML) INJECTION SOLUTION: 0.5 % (5 mg/mL) | INTRAMUSCULAR | Qty: 30

## 2014-03-13 MED FILL — PROPOFOL 10 MG/ML IV EMUL: 10 mg/mL | INTRAVENOUS | Qty: 180

## 2014-03-13 MED FILL — GLYCOPYRROLATE 0.2 MG/ML IJ SOLN: 0.2 mg/mL | INTRAMUSCULAR | Qty: 2

## 2014-03-13 MED FILL — QUELICIN 20 MG/ML INJECTION SOLUTION: 20 mg/mL | INTRAMUSCULAR | Qty: 10

## 2014-03-13 MED FILL — NEOSTIGMINE METHYLSULFATE 1 MG/ML INJECTION: 1 mg/mL | INTRAMUSCULAR | Qty: 10

## 2014-03-13 MED FILL — DOCUSATE SODIUM 100 MG CAP: 100 mg | ORAL | Qty: 1

## 2014-03-13 NOTE — Other (Signed)
TRANSFER - IN REPORT:    Verbal report received from RN(name) on Jo Simmons  being received from 215(unit) for routine progression of care      Report consisted of patient???s Situation, Background, Assessment and   Recommendations(SBAR).     Information from the following report(s) Kardex, MAR and Recent Results was reviewed with the receiving nurse.    Opportunity for questions and clarification was provided.      Assessment completed upon patient???s arrival to unit and care assumed.

## 2014-03-13 NOTE — Progress Notes (Signed)
END OF SHIFT NOTE:    INTAKE/OUTPUT  06/18 0700 - 06/19 0659  In: 824 [I.V.:873]  Out: -   2 urine occurences  Voiding: YES  Catheter: NO  Drain:              Flatus: Patient does have flatus present.    Stool:  0 occurrences.    Characteristics:       Emesis: 0 occurrences.    Characteristics:        VITAL SIGNS  Patient Vitals for the past 12 hrs:   Temp Pulse Resp BP SpO2   03/13/14 0611 98.1 ??F (36.7 ??C) 73 18 141/72 mmHg 96 %   03/13/14 0345 98.6 ??F (37 ??C) 65 16 123/72 mmHg 95 %   03/12/14 2319 98.2 ??F (36.8 ??C) 68 16 111/58 mmHg 95 %   03/12/14 1950 98.6 ??F (37 ??C) 74 16 139/69 mmHg 97 %       Pain Assessment  Pain Intensity 1: 0 (03/13/14 2353)        Patient Stated Pain Goal: 0    Ambulating  YES    Shift report given to oncoming nurse at the bedside.    Brent Bulla, RN

## 2014-03-13 NOTE — Progress Notes (Signed)
TRANSFER - OUT REPORT:    Verbal report given to Jeannie Fend, RN(name) on Iza  being transferred to Pre-Op(unit) for ordered procedure       Report consisted of patient???s Situation, Background, Assessment and   Recommendations(SBAR).     Information from the following report(s) SBAR was reviewed with the receiving nurse.    Lines:   Peripheral IV 03/12/14 Left Antecubital (Active)   Site Assessment Clean, dry, & intact 03/13/2014  1:54 AM   Phlebitis Assessment 0 03/13/2014  1:54 AM   Infiltration Assessment 0 03/13/2014  1:54 AM   Dressing Status Clean, dry, & intact 03/13/2014  1:54 AM   Dressing Type Transparent 03/13/2014  1:54 AM   Hub Color/Line Status Infusing 03/13/2014  1:54 AM        Opportunity for questions and clarification was provided.      Patient transported with:   [employer]

## 2014-03-13 NOTE — Anesthesia Post-Procedure Evaluation (Signed)
Post-Anesthesia Evaluation and Assessment    Patient: Jo Simmons MRN: [id number]  SSN: [id number]    Date of Birth: [birth year]  Age: 80 y.o.  Sex: female       Cardiovascular Function/Vital Signs  Visit Vitals   Item Reading   ??? BP 150/72 mmHg   ??? Pulse 62   ??? Temp 36.4 ??C (97.6 ??F)   ??? Resp 16   ??? Ht 5\' 7"  (1.702 m)   ??? Wt 68.04 kg (150 lb)   ??? BMI 23.49 kg/m2   ??? SpO2 99%       Patient is status post general anesthesia for Procedure(s):  CHOLECYSTECTOMY LAPAROSCOPIC Possible Open.    Nausea/Vomiting: None    Postoperative hydration reviewed and adequate.    Pain:  Pain Scale 1: Visual (03/13/14 0854)  Pain Intensity 1: 6 (03/13/14 0854)   Managed    Neurological Status:   Neuro  Neurologic State: Drowsy (03/13/14 5621)  Orientation Level: Oriented to person;Oriented to place;Oriented to situation (03/12/14 1949)  Cognition: Follows commands (03/12/14 1949)  Speech: Clear (03/12/14 1949)  LUE Motor Response: Purposeful (03/12/14 1949)  LLE Motor Response: Purposeful (03/12/14 1949)  RUE Motor Response: Purposeful (03/12/14 1949)  RLE Motor Response: Purposeful (03/12/14 1949)   At baseline    Mental Status and Level of Consciousness: Alert and oriented     Pulmonary Status:   O2 Device: Nasal cannula (03/13/14 0839)   Adequate oxygenation and airway patent    Complications related to anesthesia: TMJ unchanged from preop.    Post-anesthesia assessment completed. No concerns    Signed By: Marcy Panning, MD     March 13, 2014

## 2014-03-13 NOTE — Other (Signed)
TRANSFER - OUT REPORT:    Verbal report given to [place] RN(name) on Jo Simmons  being transferred to 215(unit) for routine post - op       Report consisted of patient???s Situation, Background, Assessment and   Recommendations(SBAR).     Information from the following report(s) Kardex, OR Summary, Intake/Output and MAR was reviewed with the receiving nurse.    Lines:   Peripheral IV 03/12/14 Left Antecubital (Active)   Site Assessment Clean, dry, & intact 03/13/2014  8:39 AM   Phlebitis Assessment 0 03/13/2014  8:39 AM   Infiltration Assessment 0 03/13/2014  8:39 AM   Dressing Status Clean, dry, & intact 03/13/2014  8:39 AM   Dressing Type Tape;Transparent 03/13/2014  8:39 AM   Hub Color/Line Status Pink;Infusing 03/13/2014  8:39 AM        Opportunity for questions and clarification was provided.      Patient transported with:   O2 @ 2 liters     VTE prophylaxis orders have been written for Jo Simmons.    Family updated.

## 2014-03-13 NOTE — Progress Notes (Signed)
Up to BR, gait slow and unsteady. Voided aprox 500 cc clear yellow urine

## 2014-03-14 LAB — METABOLIC PANEL, COMPREHENSIVE
A-G Ratio: 1 — ABNORMAL LOW (ref 1.2–3.5)
ALT (SGPT): 111 U/L — ABNORMAL HIGH (ref 12–65)
AST (SGOT): 69 U/L — ABNORMAL HIGH (ref 15–37)
Albumin: 2.7 g/dL — ABNORMAL LOW (ref 3.2–4.6)
Alk. phosphatase: 223 U/L — ABNORMAL HIGH (ref 50–136)
Anion gap: 8 mmol/L (ref 7–16)
BUN: 4 MG/DL — ABNORMAL LOW (ref 8–23)
Bilirubin, total: 1.6 MG/DL — ABNORMAL HIGH (ref 0.2–1.1)
CO2: 25 mmol/L (ref 21–32)
Calcium: 8.3 MG/DL (ref 8.3–10.4)
Chloride: 109 mmol/L — ABNORMAL HIGH (ref 98–107)
Creatinine: 0.96 MG/DL (ref 0.6–1.0)
GFR est AA: >60 mL/min/{1.73_m2} (ref >60)
GFR est non-AA: 59 mL/min/{1.73_m2} — ABNORMAL LOW (ref >60)
Globulin: 2.8 g/dL (ref 2.3–3.5)
Glucose: 107 mg/dL — ABNORMAL HIGH (ref 65–100)
Potassium: 4 mmol/L (ref 3.5–5.1)
Protein, total: 5.5 g/dL — ABNORMAL LOW (ref 6.3–8.2)
Sodium: 142 mmol/L (ref 136–145)

## 2014-03-14 MED ORDER — OXYCODONE-ACETAMINOPHEN 5 MG-325 MG TAB
5-325 mg | ORAL_TABLET | ORAL | Status: DC | PRN
Start: 2014-03-14 — End: 2014-03-16

## 2014-03-14 MED ADMIN — budesonide (PULMICORT) 500 mcg/2 ml nebulizer suspension: RESPIRATORY_TRACT | @ 12:00:00 | NDC 76282064138

## 2014-03-14 MED ADMIN — docusate sodium (COLACE) capsule 100 mg: ORAL | @ 10:00:00 | NDC 62584068311

## 2014-03-14 MED ADMIN — piperacillin-tazobactam (ZOSYN) 4.5 g in 0.9% sodium chloride (MBP/ADV) 100 mL ADV: INTRAVENOUS | @ 10:00:00 | NDC 00409710167

## 2014-03-14 MED ADMIN — oxyCODONE-acetaminophen (PERCOCET) 5-325 mg per tablet 1 Tab: ORAL | @ 10:00:00 | NDC 00406051223

## 2014-03-14 MED ADMIN — budesonide (PULMICORT) 500 mcg/2 ml nebulizer suspension: RESPIRATORY_TRACT | NDC 76282064138

## 2014-03-14 MED ADMIN — piperacillin-tazobactam (ZOSYN) 4.5 g in 0.9% sodium chloride (MBP/ADV) 100 mL ADV: INTRAVENOUS | @ 02:00:00 | NDC 00409710167

## 2014-03-14 MED ADMIN — pantoprazole (PROTONIX) tablet 40 mg: ORAL | @ 15:00:00 | NDC 51079005101

## 2014-03-14 MED ADMIN — PARoxetine (PAXIL) tablet 20 mg: ORAL | @ 15:00:00 | NDC 68084004511

## 2014-03-14 MED FILL — DOCUSATE SODIUM 100 MG CAP: 100 mg | ORAL | Qty: 1

## 2014-03-14 MED FILL — PIPERACILLIN-TAZOBACTAM 4.5 GRAM IV SOLR: 4.5 gram | INTRAVENOUS | Qty: 4.5

## 2014-03-14 MED FILL — PANTOPRAZOLE 40 MG TAB, DELAYED RELEASE: 40 mg | ORAL | Qty: 1

## 2014-03-14 MED FILL — PAROXETINE 20 MG TAB: 20 mg | ORAL | Qty: 1

## 2014-03-14 MED FILL — OXYCODONE-ACETAMINOPHEN 5 MG-325 MG TAB: 5-325 mg | ORAL | Qty: 1

## 2014-03-14 NOTE — Progress Notes (Signed)
Robinette Haines, M.D.   Colon and Rectal Surgeon  [facility]  [address]  [place] [address]  Phone: [phone] Fax: [phone]      Jo Simmons  [id number]    SUBJECTIVE:  80 year old female had ERCP and stone extraction, but continued to feel poorly and have abdominal pain after the procedure.  Taken for Lap chole 03/13/14.  Sleeping well.  Pain mild/controlled.  No n/v.  OOB a little walking    PHYSICAL EXAM:   Patient Vitals for the past 24 hrs:   BP Temp Pulse Resp SpO2   03/14/14 0710 136/76 mmHg 97.8 ??F (36.6 ??C) 73 16 94 %   03/14/14 0305 145/80 mmHg 98.3 ??F (36.8 ??C) 70 18 93 %   03/13/14 2315 153/78 mmHg 97.6 ??F (36.4 ??C) 76 18 92 %   03/13/14 1900 135/73 mmHg 98.2 ??F (36.8 ??C) 70 20 97 %   03/13/14 1507 119/72 mmHg 98.2 ??F (36.8 ??C) 69 16 97 %   03/13/14 1010 154/72 mmHg 97.6 ??F (36.4 ??C) 62 16 100 %   03/13/14 0934 151/76 mmHg 97.6 ??F (36.4 ??C) 62 16 96 %   03/13/14 0919 149/73 mmHg - 62 16 97 %   03/13/14 0906 150/72 mmHg - 62 16 99 %   03/13/14 0851 152/77 mmHg - 64 16 98 %   03/13/14 0846 163/85 mmHg - 67 16 99 %   03/13/14 0841 165/84 mmHg - 69 14 97 %   03/13/14 0839 180/84 mmHg 97.6 ??F (36.4 ??C) 73 14 100 %   03/13/14 0836 180/84 mmHg - 80 - 99 %       General--AAO, NAD, answers all questions, appears comfortable  Abdomen--soft, mildly tender, nondistended.  No peritoneal signs, no rebound, no guarding.  Incisions clean, dry, and approximated.  Some bruising    UOP: 500/2  BM: nr    Recent Labs      03/12/14   1343   WBC  3.4*   HGB  13.8   HCT  40.5   PLT  289       Recent Labs      03/12/14   1343   NA  140   K  4.1   CL  109*   CO2  25   BUN  14   CREA  0.93   TBILI  2.2*   SGOT  104*   ALT  175*   AP  299*   MG  2.1       Recent Labs      03/12/14   1343   AML  98   LPSE  256       No results for input(s): PTP, INR, APTT in the last 72 hours.  Above labs reviewed by me.    ASSESSMENT:   80 year old female had ERCP and stone extraction, but  continued to feel poorly and have abdominal pain after the procedure.  Taken for Lap chole 03/13/14.    Elevated LFTs    PLAN:  --recheck LFTs this AM  --diet as tolerated  --mobilize  --home later today or tomorrow?    Robinette Haines, MD

## 2014-03-14 NOTE — Progress Notes (Signed)
Discharge instructions given to pt's dtr, verbalizes understanding of post surgical care for cholecystectomy. Saline lock dc'd, tip intact

## 2014-03-14 NOTE — Progress Notes (Signed)
END OF SHIFT NOTE:    INTAKE/OUTPUT  06/19 0700 - 06/20 0659  In: 0354 [I.V.:1714]  Out: 530 [Urine:500]  Voiding: YES  Catheter: NO  Drain:              Flatus: Patient does not have flatus present.    Stool:  0 occurrences.    Characteristics:       Emesis: 0 occurrences.    Characteristics:        VITAL SIGNS  Patient Vitals for the past 12 hrs:   Temp Pulse Resp BP SpO2   03/14/14 0305 98.3 ??F (36.8 ??C) 70 18 145/80 mmHg 93 %   03/13/14 2315 97.6 ??F (36.4 ??C) 76 18 153/78 mmHg 92 %   03/13/14 1900 98.2 ??F (36.8 ??C) 70 20 135/73 mmHg 97 %       Pain Assessment  Pain Intensity 1: 0 (03/14/14 0108)  Pain Location 1: Abdomen  Pain Intervention(s) 1: Emotional support  Patient Stated Pain Goal: 0    Ambulating  YES with help to BR.   Pleasantly confused. No drainage at puncture sites. Pt is not aware of any flatus.    Shift report given to oncoming nurse at the bedside.    Latricia Heft, RN

## 2014-03-16 MED ORDER — PANTOPRAZOLE 40 MG TAB, DELAYED RELEASE
40 mg | ORAL_TABLET | Freq: Every day | ORAL | Status: DC
Start: 2014-03-16 — End: 2015-02-05

## 2014-03-16 NOTE — Progress Notes (Signed)
[facility]  Audi Conover C. Posey Pronto, M.D.  Campbell Riches, M.D.  [place], [place]  Ph No:  [phone]  Fax:  [phone]      CHIEF COMPLAINT: follow-up visit         HISTORY OF PRESENT ILLNESS: Ms. Cogliano is a 80 y.o. female with COPD, GERD, hypothyroidism, recent cholecystectomy.  She is seen in office today come in by daughter stating that she is doing better.  Her appetite has been picking up her jaundice has been improving.  Her wound has been healing very well.  Her nausea has improved.  She has pruritus that has improved.  No chest pain.  No swelling.  No cough.  No congestion.      HISTORY:  Allergies   Allergen Reactions   ??? Codeine Nausea Only     Past Medical History   Diagnosis Date   ??? Anemia    ??? Arthritis    ??? Cardiomegaly      d/t age;  asymptomatic   ??? HH (hiatus hernia)    ??? Osteoarthritis    ??? Gastritis    ??? Anemia, pernicious      since childhood   ??? Hiatal hernia    ??? Chronic obstructive pulmonary disease (HCC)      peer xray; asymptomatic; very mild/ no treatment   ??? GERD (gastroesophageal reflux disease)    ??? Thyroid disease      hypothyroid   ??? 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 Depression    ??? Dementia      with short term memory loss-needs assist with ADL's     Past Surgical History   Procedure Laterality Date   ??? Hx endoscopy     ??? Hx tonsillectomy     ??? Hx appendectomy     ??? Hx orthopaedic       right hip replacement   ??? Hx cholecystectomy       No family history on file.  History     Social History   ??? Marital Status: WIDOWED     Spouse Name: N/A     Number of Children: N/A   ??? Years of Education: N/A     Occupational History   ??? Not on file.     Social History Main Topics   ??? Smoking status: Never Smoker    ??? Smokeless tobacco: Not on file   ??? Alcohol Use: Yes      Comment: social   ??? Drug Use: No   ??? Sexual Activity: No      Other Topics Concern   ??? Not on file     Social History Narrative     Current Outpatient Prescriptions   Medication Sig Dispense Refill   ??? hydrOXYzine (ATARAX) 25 mg tablet Take  by mouth three (3) times daily as needed for Itching.     ??? levothyroxine (SYNTHROID) 50 mcg tablet Take 1 Tab by mouth Daily (before breakfast). 90 Tab 3   ??? PARoxetine (PAXIL) 20 mg tablet Take 1 Tab by mouth daily. 90 Tab 3   ??? diphenhydrAMINE (BENADRYL) 25 mg capsule Take 25 mg by mouth every six (6) hours as needed.     ??? oxazepam (SERAX) 15 mg capsule      ??? BD LUER-LOK SYRINGE 3 mL 25 x  1" syrg      ??? estradiol (ESTRACE) 0.01 % (0.1 mg/gram) vaginal cream 1 gram vaginally 3x/week 42.5 g 6   ??? cyanocobalamin (VITAMIN B12) 1,000 mcg/mL injection 1 mL by IntraMUSCular route every thirty (30) days. 3 Vial 5   ??? oxazepam (SERAX) 10 mg capsule Take 1 Cap by mouth nightly as needed for Sleep or Anxiety. Max Daily Amount: 10 mg. 30 Cap 4   ??? ergocalciferol (VITAMIN D2) 50,000 unit capsule Take 50,000 Units by mouth every thirty (30) days.     ??? pantoprazole (PROTONIX) 40 mg tablet Take 1 Tab by mouth daily. 90 Tab 4   ??? vitamin E (AQUA GEMS) 400 unit capsule Take  by mouth daily. Last dose 10/10/13     ??? docusate sodium (COLACE) 100 mg capsule Take 100 mg by mouth every morning.     ??? calcium carbonate (CALTREX) 600 mg (1,500 mg) tablet Take 600 mg by mouth daily.     ??? MULTIVITAMIN WITH MINERALS (ONE-A-DAY 50 PLUS PO) Take  by mouth. Last dose 10/10/13         MEDICATIONS:  Current outpatient prescriptions: hydrOXYzine (ATARAX) 25 mg tablet, Take  by mouth three (3) times daily as needed for Itching., Disp: , Rfl: ;  levothyroxine (SYNTHROID) 50 mcg tablet, Take 1 Tab by mouth Daily (before breakfast)., Disp: 90 Tab, Rfl: 3;  PARoxetine (PAXIL) 20 mg tablet, Take 1 Tab by mouth daily., Disp: 90 Tab, Rfl: 3;  diphenhydrAMINE (BENADRYL) 25 mg capsule, Take 25 mg by mouth every six (6) hours as needed., Disp: , Rfl:    oxazepam (SERAX) 15 mg capsule, , Disp: , Rfl: ;  BD LUER-LOK SYRINGE 3 mL 25 x 1" syrg, , Disp: , Rfl: ;  estradiol (ESTRACE) 0.01 % (0.1 mg/gram) vaginal cream, 1 gram vaginally 3x/week, Disp: 42.5 g, Rfl: 6;  cyanocobalamin (VITAMIN B12) 1,000 mcg/mL injection, 1 mL by IntraMUSCular route every thirty (30) days., Disp: 3 Vial, Rfl: 5  oxazepam (SERAX) 10 mg capsule, Take 1 Cap by mouth nightly as needed for Sleep or Anxiety. Max Daily Amount: 10 mg., Disp: 30 Cap, Rfl: 4;  ergocalciferol (VITAMIN D2) 50,000 unit capsule, Take 50,000 Units by mouth every thirty (30) days., Disp: , Rfl: ;  pantoprazole (PROTONIX) 40 mg tablet, Take 1 Tab by mouth daily., Disp: 90 Tab, Rfl: 4;  vitamin E (AQUA GEMS) 400 unit capsule, Take  by mouth daily. Last dose 10/10/13, Disp: , Rfl:   docusate sodium (COLACE) 100 mg capsule, Take 100 mg by mouth every morning., Disp: , Rfl: ;  calcium carbonate (CALTREX) 600 mg (1,500 mg) tablet, Take 600 mg by mouth daily., Disp: , Rfl: ;  MULTIVITAMIN WITH MINERALS (ONE-A-DAY 50 PLUS PO), Take  by mouth. Last dose 10/10/13, Disp: , Rfl:       REVIEW OF SYSTEMS:  GENERAL/CONSTITUTIONAL: negative  HEAD, EYES, EARS, NOSE AND THROAT: negative  CARDIOVASCULAR: negative  RESPIRATORY:  negative   GASTROINTESTINAL: obstructive jaundice, recent cholecystectomy and ERCP  GENITOURINARY: negative  MUSCULOSKELETAL: DJD  BREASTS: negative  SKIN:  negative  NEUROLOGIC: negative  PSYCHIATRIC: negative  ENDOCRINE:  negative  HEMATOLOGIC/LYMPHATIC:  negative  ALLERGIC/IMMUNOLOGIC:  negative        PHYSICAL EXAM  Vital Signs - BP 118/82 mmHg   Pulse 99   Ht '5\' 7"'  (1.702 m)   Wt 143 lb (64.864 kg)   BMI 22.39 kg/m2   Constitutional - alert, well appearing, and in no distress.  Eyes -  pupils equal and reactive, extraocular eye movements intact.  Ear, Nose, Mouth, Throat - normal  Examination of oropharynx: oral mucosa moist  Neck - supple, no significant adenopathy.    Respiratory - clear to auscultation, no wheezes, rales or bronchi, symmetric air entry.  Cardiovascular - normal rate, regular rhythm, normal S1, S2, systolic     Gastrointestinal - Abdomen soft, nontender, nondistended, no masses.  Back exam - range of motion limited  Musculoskeletal - DJD changes noted.  Skin - normal coloration and turgor, no rashes, no suspicious skin lesions noted.  Neurological - Cranial nerves with notation of any deficits. Motor and sensory exam is intact   Extremities - peripheral pulses normal, no pedal edema, no clubbing or cyanosis  Psychiatric - alert, oriented to person, place, and time.  anxious    LABS  Results for orders placed or performed during the hospital encounter of 03/12/14   CBC WITH AUTOMATED DIFF   Result Value Ref Range    WBC 3.4 (L) 4.3 - 11.1 K/uL    RBC 4.82 4.05 - 5.25 M/uL    HGB 13.8 11.7 - 15.4 g/dL    HCT 40.5 35.8 - 46.3 %    MCV 84.0 79.6 - 97.8 FL    MCH 28.6 26.1 - 32.9 PG    MCHC 34.1 31.4 - 35.0 g/dL    RDW 14.4 11.9 - 14.6 %    PLATELET 289 150 - 450 K/uL    MPV 9.3 (L) 10.8 - 14.1 FL    DF AUTOMATED      NEUTROPHILS 64 43 - 78 %    LYMPHOCYTES 24 13 - 44 %    MONOCYTES 12 4.0 - 12.0 %    EOSINOPHILS 0 (L) 0.5 - 7.8 %    BASOPHILS 0 0.0 - 2.0 %    IMMATURE GRANULOCYTES 0.3 0.0 - 5.0 %    ABS. NEUTROPHILS 2.2 1.7 - 8.2 K/UL    ABS. LYMPHOCYTES 0.8 0.5 - 4.6 K/UL    ABS. MONOCYTES 0.4 0.1 - 1.3 K/UL    ABS. EOSINOPHILS 0.0 0.0 - 0.8 K/UL    ABS. BASOPHILS 0.0 0.0 - 0.2 K/UL    ABS. IMM. GRANS. 0.0 0.0 - 0.5 K/UL   MAGNESIUM   Result Value Ref Range    Magnesium 2.1 1.8 - 2.4 mg/dL   METABOLIC PANEL, COMPREHENSIVE   Result Value Ref Range    Sodium 140 136 - 145 mmol/L    Potassium 4.1 3.5 - 5.1 mmol/L    Chloride 109 (H) 98 - 107 mmol/L    CO2 25 21 - 32 mmol/L    Anion gap 6 (L) 7 - 16 mmol/L    Glucose 95 65 - 100 mg/dL    BUN 14 8 - 23 MG/DL    Creatinine 0.93 0.6 - 1.0 MG/DL    GFR est AA >60 >60 ml/min/1.57m    GFR est non-AA >60 >60 ml/min/1.730m    Calcium 9.2 8.3 - 10.4 MG/DL    Bilirubin, total 2.2 (H) 0.2 - 1.1 MG/DL    ALT 175 (H) 12 - 65 U/L    AST 104 (H) 15 - 37 U/L    Alk. phosphatase 299 (H) 50 - 136 U/L    Protein, total 6.5 6.3 - 8.2 g/dL    Albumin 3.4 3.2 - 4.6 g/dL    Globulin 3.1 2.3 - 3.5 g/dL    A-G Ratio 1.1 (L) 1.2 - 3.5     LIPASE  Result Value Ref Range    Lipase 256 73 - 393 U/L   AMYLASE   Result Value Ref Range    Amylase 98 25 - 115 U/L   AMMONIA   Result Value Ref Range    Ammonia <10 (L) 11 - 32 UMOL/L   URINE MICROSCOPIC   Result Value Ref Range    WBC 10-20 0 /hpf    RBC 0 0 /hpf    Epithelial cells 10-20 0 /hpf    Bacteria TRACE 0 /hpf    Casts 0 0 /lpf    Crystals 0 0 /LPF    Mucus 2+ (H) 0 /lpf   METABOLIC PANEL, COMPREHENSIVE   Result Value Ref Range    Sodium 142 136 - 145 mmol/L    Potassium 4.0 3.5 - 5.1 mmol/L    Chloride 109 (H) 98 - 107 mmol/L    CO2 25 21 - 32 mmol/L    Anion gap 8 7 - 16 mmol/L    Glucose 107 (H) 65 - 100 mg/dL    BUN 4 (L) 8 - 23 MG/DL    Creatinine 0.96 0.6 - 1.0 MG/DL    GFR est AA >60 >60 ml/min/1.66m    GFR est non-AA 59 (L) >60 ml/min/1.781m   Calcium 8.3 8.3 - 10.4 MG/DL    Bilirubin, total 1.6 (H) 0.2 - 1.1 MG/DL    ALT 111 (H) 12 - 65 U/L    AST 69 (H) 15 - 37 U/L    Alk. phosphatase 223 (H) 50 - 136 U/L    Protein, total 5.5 (L) 6.3 - 8.2 g/dL    Albumin 2.7 (L) 3.2 - 4.6 g/dL    Globulin 2.8 2.3 - 3.5 g/dL    A-G Ratio 1.0 (L) 1.2 - 3.5     POC TROPONIN-I   Result Value Ref Range    Troponin-I (POC) 0 0.0 - 0.08 ng/ml   EKG, 12 LEAD, INITIAL   Result Value Ref Range    Systolic BP  mmHg    Diastolic BP  mmHg    Ventricular Rate 60 BPM    Atrial Rate 60 BPM    P-R Interval 166 ms    QRS Duration 88 ms    Q-T Interval 444 ms    QTC Calculation (Bezet) 444 ms    Calculated P Axis 49 degrees    Calculated R Axis 77 degrees    Calculated T Axis 62 degrees    Diagnosis       !! AGE AND GENDER SPECIFIC ECG ANALYSIS !!  Normal sinus rhythm  Normal ECG  No previous ECGs available   Confirmed by [facility] ([facility]), MATTHEW G ([id number]) on 03/13/2014 7:19:57 AM               IMPRESSION:    ICD-9-CM    1. Benign essential HTN 401.1    2. COPD (chronic obstructive pulmonary disease) ([id number]    3. Gastroesophageal reflux disease without esophagitis 530.81    4. Obstructive jaundice 576.8    5. History of cholecystectomy V45.79           PLAN:  I discussed with patient and reviewed the finding.  Overall, she is nicely recuperating after her cholecystectomy.  Her liver panel has been improving.  Her jaundice appeared to be improving clinically, she is feeling better.  We will repeat the lab work in a few weeks.  She will continue with her dietary regimen.  Low-salt,  low-cholesterol diet.  Follow-up is recommended.  See orders.  Medication list.  She is advised to stop her Atarax.    Zenovia Jarred, MD          Dictated using voice recognition software. Proofread, but unrecognized voice recognition errors may exist.

## 2014-03-16 NOTE — Addendum Note (Signed)
Addendum created 03/16/14 1441 by Vear Clock, CRNA    Modules edited: Anesthesia Flowsheet

## 2014-03-18 NOTE — Progress Notes (Signed)
[facility]  [address] [place] [address]  [phone]    Jo Simmons  DOB: November 17, 1927    Chief Complaint   Patient presents with   ??? Other     f/u rom uti when she was out of town          HPI     Jo Simmons is a 80 y.o. female who presents to office with her daughter for f/u on UTI.  Patient treated with course of Bactrim for UTI diagnosed on 02/21/14 while she was out of town. Patient is asymptomatic today.   Patient had ERCP and stone extraction in 02/2014, but continued to feel poorly and have abdominal pain after the procedure. Taken for Lap chole 03/13/14.     Patient is followed for urethral mucosal prolapse. Last seen on 11/2013. 03/2013 cysto by Dr Tamala Julian was normal other than urethral prolapse. Patient is using vaginal estrogen cream regularly.  Patient reports 2-3 UTIs in the past 20months. 07/01/13 urine culture grew >100,000 colonies and antibiotic Rxd by Dr Posey Pronto. With the 2 other UTIs, no UA was checked and antibiotics were just called in over the phone.     Patient initially presented to office on 02/10/13 for evaluation of urethral prolapse referred by Dr Laurann Montana (oncology). Patient saw a Dealer in [place] approximately 1 year ago for UTIs and urge incontinence. Was started on Estrace vaginal cream and Gelnique at that time however fell and has been in rehab for several months after Right THR. She had been off Estrace cream 3 months and noted to have urethral prolapse when she was catheterized in rehab. Patient was referred to Dr Laurann Montana and was told culture/biopsy was benign. 01/03/13 cytology was negative.  Patient had UTIs in rehab however no UTIs since returned home.   Patient denies any bladder or urethral pain. Denies any gross hematuria. Daytime frequency is q3hours and no nocturia. She admits to occasional urge incontinence which is not bothersome to her.     The patient's COPD is stable.The patient's hypothyroidism is stable       Past Medical History   Diagnosis Date    ??? Anemia    ??? Arthritis    ??? Cardiomegaly      d/t age;  asymptomatic   ??? HH (hiatus hernia)    ??? Osteoarthritis    ??? Gastritis    ??? Anemia, pernicious      since childhood   ??? Hiatal hernia    ??? Chronic obstructive pulmonary disease (HCC)      peer xray; asymptomatic; very mild/ no treatment   ??? GERD (gastroesophageal reflux disease)    ??? Thyroid disease      hypothyroid   ??? 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 Depression    ??? Dementia      with short term memory loss-needs assist with ADL's     Past Surgical History   Procedure Laterality Date   ??? Hx endoscopy     ??? Hx tonsillectomy     ??? Hx appendectomy     ??? Hx orthopaedic       right hip replacement   ??? Hx cholecystectomy       Current Outpatient Prescriptions   Medication Sig Dispense Refill   ??? pantoprazole (PROTONIX) 40 mg tablet Take 1 Tab by mouth daily. 90 Tab 4   ???  levothyroxine (SYNTHROID) 50 mcg tablet Take 1 Tab by mouth Daily (before breakfast). 90 Tab 3   ??? PARoxetine (PAXIL) 20 mg tablet Take 1 Tab by mouth daily. 90 Tab 3   ??? BD LUER-LOK SYRINGE 3 mL 25 x 1" syrg      ??? estradiol (ESTRACE) 0.01 % (0.1 mg/gram) vaginal cream 1 gram vaginally 3x/week 42.5 g 6   ??? cyanocobalamin (VITAMIN B12) 1,000 mcg/mL injection 1 mL by IntraMUSCular route every thirty (30) days. 3 Vial 5   ??? ergocalciferol (VITAMIN D2) 50,000 unit capsule Take 50,000 Units by mouth every thirty (30) days.     ??? vitamin E (AQUA GEMS) 400 unit capsule Take  by mouth daily. Last dose 10/10/13     ??? calcium carbonate (CALTREX) 600 mg (1,500 mg) tablet Take 600 mg by mouth daily.     ??? MULTIVITAMIN WITH MINERALS (ONE-A-DAY 50 PLUS PO) Take  by mouth. Last dose 10/10/13       Allergies   Allergen Reactions   ??? Codeine Nausea Only     History     Social History   ??? Marital Status: WIDOWED     Spouse Name: N/A     Number of Children: N/A   ??? Years of Education: N/A     Occupational History   ??? Not on file.      Social History Main Topics   ??? Smoking status: Never Smoker    ??? Smokeless tobacco: Not on file   ??? Alcohol Use: Yes      Comment: social   ??? Drug Use: No   ??? Sexual Activity: No     Other Topics Concern   ??? Not on file     Social History Narrative     History reviewed. No pertinent family history.    Review of Systems  Constitutional:   Negative for fever and chills.  Genitourinary:  Negative for nocturia, urgency and frequent urination.  Musculoskeletal:  Negative for back pain and arthralgias.      Urinalysis  UA - Dipstick  No results found for this or any previous visit.    UA - Micro  WBC - 0  RBC - 0  Bacteria - 0  Epith - 0  Patient placed in supine position and the urethral meatus was cleansed with antiseptic.  82F non-indwelling catheter was placed without incident.  Urine returned from the catheter.  No bleeding or resistance noted.  Catheter was removed.  Patient tolerated the procedure well. sg    Catheterized for <3cc. Unable to perform UA dipstick.  Micro only    PHYSICAL EXAM    Filed Vitals:    03/18/14 1616   BP: 100/50   Pulse: 144   Temp: 99.3 ??F (37.4 ??C)   TempSrc: Tympanic   Height: 5\' 7"  (1.702 m)   Weight: 143 lb 3.2 oz (64.955 kg)       General appearance - alert, well appearing, and in no distress  Mental status - alert, oriented to person, place, and time  Eyes - Bilaterally normal & regular  Nose - normal and patent, no erythema, discharge   Neck - supple, no significant adenopathy  Chest/Lung-  Quiet, even and easy respiratory effort without use of accessory muscles  Abdomen - soft, nontender, nondistended, no masses or organomegaly  Back exam - full range of motion, no tenderness  Neurological - alert, oriented, normal speech, no focal findings or movement disorder noted on gross visual exam  Musculoskeletal - normal gait and station  Extremities - normal full range of motion of all extremities  Skin - normal coloration and turgor, no rashes     Urethra: soft & nontender urethral meatus mucosal prolapse. No induration noted. Unchanged from 11/2013 exam.     PLAN  UA normal today  Continue Estrace vaginal cream 3x/week--urethral mucosal prolapse is unchanged from 11/2013  RTO in 54months or sooner if UTI or hematuria occurs          Assessment and Plan    ICD-9-CM    1. UTI (lower urinary tract infection) 599.0 CATHETERIZE FOR URINE SPEC     CANCELED: AMB POC URINALYSIS DIP STICK AUTO W/ MICRO (PGU)   2. Prolapse urethral mucosa 599.5          Lita Mains, WHNP-BC  Time Spent with patient:  15 Minutes (>50% of this time was spent on counseling this patient about UTI, urethral prolapse)  Supervising Physician: Dr Queen Slough

## 2014-03-19 NOTE — Patient Instructions (Signed)
[facility]  [address], [address]  [place] [address]  [phone]    CARE INSTRUCTIONS  Your surgeon has given you verbal instructions regarding your plan of care today.    FOLLOW-UP AFTER A TEST OR STUDY  If you are having labs drawn or a radiologic study, there will be a follow-up appt needed after that, which either has already been made for you today or will need to be made by you to review the results of your study or test (unless special arrangements were made today for you by your surgeon).      We are unable to review results over the phone because those results invariably lead to questions that you and your surgeon need to make together during an appt.  Therefore, please do not call the office for test results as they should be discussed at your next appt, and if you don't have a next appt, please call and get one after your test is completed, or as you were instructed.    AFTER-HOURS CALLS  We have a surgeon-call for emergencies 24 hrs a day.  After the office is closed, which is usually at 5PM and on weekends and holidays, the on-call surgeon can be reached by dialing our office number ([phone]).    Please do not call unless you are having an urgent or emergent issue.  The on-call surgeon cannot make appt changes or call in refills for pain medications after the office is closed.

## 2014-03-21 NOTE — Op Note (Signed)
[place], [place]                                [phone]                                OPERATIVE REPORT    NAME:  Jo Simmons, Jo Simmons                             MR:  [id number]  LOC:  [id number]            SEX:  F               ACCT:  [id number]  DOB:  Mar 02, 1928            AGE:  80              PT:  I  ADMIT:  03/12/2014          DSCH:  03/14/2014     MSV:        DATE OF SURGERY: 03/13/2014    PREPROCEDURE DIAGNOSIS: Cholecystitis.    POSTPROCEDURE DIAGNOSES: Cholecystitis.    NAME OF PROCEDURE: Laparoscopic cholecystectomy.    SURGEON: Marjory Lies, MD    ASSISTANT: None.    ANESTHESIA: General.    ESTIMATED BLOOD LOSS: 20 mL.    BLOOD REPLACED: None.    DRAINS: None.    COMPLICATIONS: None.    CONDITION AT COMPLETION: Stable.    INDICATIONS: This patient is an 80 year old white female who developed  acute cholecystitis. The risks, benefits, and alternatives to surgical  cholecystectomy were discussed in detail with the patient. She understood  the risks and wished to proceed and appropriate consent was given.    PROCEDURE IN DETAIL: The patient was taken to the operating room, placed  on the table in supine position. General endotracheal anesthesia was  performed. The abdomen was prepped and draped in routine sterile fashion.  Entrance into the peritoneal cavity was gained with Hasson technique at  the umbilicus through a 1-inch incision. The abdomen was insufflated with  air and the camera introduced. A 12 port was placed in the superior  midline, and two 5's were placed in the right lateral abdomen.  Gallbladder graspers were introduced and used to grasp the gallbladder.  The dome and infundibulum retracted laterally and to the right in order  to gain the critical view. Dissection was begun at the infundibulum of   the gallbladder, and the cyst duct was ________ identified as a singular  structure going directly into the gallbladder. It was carefully  skeletonized. Two clips were placed distal and one proximal and the duct  divided sharply with scissors. The cystic artery was then dissected out  in its entirety within the cystic triangle where it was likewise clipped  and divided. The gallbladder was then taken off the liver bed with  electrocautery hook and no gross spillage. The ________ within an  EndoCatch bag and removed through  the superior port site. The liver bed  was irrigated. Hemostasis was assured. The ports were removed under  direct vision. The fascia at the umbilicus and the 12 port was closed  with a figure-of-eight 0 Vicryl suture. All skin incisions were closed  with subcuticular 4-0 Monocryl. A Dermabond dressing was applied to all  incisions. The patient tolerated the procedure well with no  complications. She was taken to recovery in stable condition.                Lorenda Cahill, MD         A                This is an unverified document unless signed by physician.    TID:  [id number]                                      DT:  03/21/2014 01:33 A  JOB:  [id number]           DOC#:  [id number]           DD:  03/20/2014    cc:   Lorenda Cahill, MD

## 2014-03-21 NOTE — Op Note (Signed)
[place], [place]                                [phone]                                OPERATIVE REPORT    NAME:  Jo Simmons, Jo Simmons                             MR:  [id number]  LOC:  [id number]            SEX:  F               ACCT:  [id number]  DOB:  1928-06-16            AGE:  80              PT:  I  ADMIT:  03/12/2014          DSCH:  03/14/2014     MSV:        DATE OF SURGERY: 03/13/2014    PREPROCEDURE DIAGNOSIS: Cholecystitis.    POSTPROCEDURE DIAGNOSES: Cholecystitis.    NAME OF PROCEDURE: Laparoscopic cholecystectomy.    SURGEON: Marjory Lies, MD    ASSISTANT: None.    ANESTHESIA: General.    ESTIMATED BLOOD LOSS: 20 mL.    BLOOD REPLACED: None.    DRAINS: None.    COMPLICATIONS: None.    CONDITION AT COMPLETION: Stable.    INDICATIONS: This patient is an 80 year old white female who developed  acute cholecystitis. The risks, benefits, and alternatives to surgical  cholecystectomy were discussed in detail with the patient. She understood  the risks and wished to proceed and appropriate consent was given.    PROCEDURE IN DETAIL: The patient was taken to the operating room, placed  on the table in supine position. General endotracheal anesthesia was  performed. The abdomen was prepped and draped in routine sterile fashion.  Entrance into the peritoneal cavity was gained with Hasson technique at  the umbilicus through a 1-inch incision. The abdomen was insufflated with  air and the camera introduced. A 12 port was placed in the superior  midline, and two 5's were placed in the right lateral abdomen.  Gallbladder graspers were introduced and used to grasp the gallbladder.  The dome and infundibulum retracted laterally and to the right in order  to gain the critical view. Dissection was begun at the infundibulum of  the gallbladder, and the cyst duct was ________  identified as a singular  structure going directly into the gallbladder. It was carefully  skeletonized. Two clips were placed distal and one proximal and the duct  divided sharply with scissors. The cystic artery was then dissected out  in its entirety within the cystic triangle where it was likewise clipped  and divided. The gallbladder was then taken off the liver bed with  electrocautery hook and no gross spillage. The ________ within an  EndoCatch bag and removed through  the superior port site. The liver bed  was irrigated. Hemostasis was assured. The ports were removed under  direct vision. The fascia at the umbilicus and the 12 port was closed  with a figure-of-eight 0 Vicryl suture. All skin incisions were closed  with subcuticular 4-0 Monocryl. A Dermabond dressing was applied to all  incisions. The patient tolerated the procedure well with no  complications. She was taken to recovery in stable condition.                Lorenda Cahill, MD         A                This is an unverified document unless signed by physician.    TID:  [id number]                                      DT:  03/21/2014 01:33 A  JOB:  [id number]           DOC#:  [id number]           DD:  03/20/2014    cc:   Lorenda Cahill, MD

## 2014-04-05 NOTE — Progress Notes (Signed)
Doing well after laparoscopic cholecystectomy.  Tolerating normal diet and having regular bowel movements.  Incisions are well healed.    May return to normal activity and diet as tolerated

## 2014-04-07 LAB — AMB POC URINALYSIS DIP STICK AUTO W/O MICRO
Creatinine: 300 mg/dL
Glucose (UA POC): NEGATIVE mg/dL
Ketones (UA POC): 15
Nitrites (UA POC): NEGATIVE
Protein (UA POC): 100
Protein Total Urine: 30
Protein-Creat Ratio: 150 mg/g
Specific gravity (UA POC): 1.02 (ref 1.001–1.035)
Urobilinogen (UA POC): 1 (ref 0.2–1)
pH (UA POC): 5.5 (ref 4.6–8.0)

## 2014-04-07 LAB — AMB POC COMPLETE CBC,AUTOMATED ENTER
ABS. GRANS (POC): 3.3 10*3/uL (ref 1.4–6.5)
ABS. LYMPHS (POC): 1.1 10*3/uL — AB (ref 1.2–3.4)
ABS. MONOS (POC): 0.3 10*3/uL (ref 0.1–0.6)
GRANULOCYTES (POC): 70 % (ref 42.2–75.2)
HCT (POC): 46.2 % (ref 35–60)
HGB (POC): 15 g/dL (ref 11–18)
LYMPHOCYTES (POC): 24.2 % (ref 20.5–51.1)
MCH (POC): 27.3 pg (ref 27–31)
MCHC (POC): 32.5 g/dL — AB (ref 33–37)
MCV (POC): 83.8 fL (ref 80–99.9)
MONOCYTES (POC): 5.8 % (ref 1.7–9.3)
MPV (POC): 6.8 fL — AB (ref 7.8–11)
PLATELET (POC): 293 10*3/uL (ref 150–450)
RBC (POC): 5.51 10*6/uL (ref 4–6)
RDW (POC): 15 % — AB (ref 11.6–13.7)
WBC (POC): 4.7 10*3/uL (ref 4.5–10.5)

## 2014-04-07 MED ORDER — TRIAMCINOLONE ACETONIDE 0.025 % OINTMENT
0.025 % | Freq: Two times a day (BID) | CUTANEOUS | Status: AC
Start: 2014-04-07 — End: 2014-05-07

## 2014-04-07 NOTE — Progress Notes (Signed)
[facility]  Meilin Brosh C. Posey Pronto, M.D.  Campbell Riches, M.D.  [place], [place]  Ph No:  [phone]  Fax:  [phone]      CHIEF COMPLAINT: follow-up visit         HISTORY OF PRESENT ILLNESS: Ms. Centola is a 80 y.o. female with past medical history positive for obstructive jaundice.  She is feeling better.   Her ERCP was not successful.  Her liver enzyme has shown improvement.  During last visit with me.  Overall she is feeling better except her left eye still shows some jaundice.  No fever, no chills, no nausea, no vomiting.  Her appetite has been better.      HISTORY:  Allergies   Allergen Reactions   ??? Codeine Nausea Only     Past Medical History   Diagnosis Date   ??? Anemia    ??? Arthritis    ??? Cardiomegaly      d/t age;  asymptomatic   ??? HH (hiatus hernia)    ??? Osteoarthritis    ??? Gastritis    ??? Anemia, pernicious      since childhood   ??? Hiatal hernia    ??? Chronic obstructive pulmonary disease (HCC)      peer xray; asymptomatic; very mild/ no treatment   ??? GERD (gastroesophageal reflux disease)    ??? Thyroid disease      hypothyroid   ??? 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 Depression    ??? Dementia      with short term memory loss-needs assist with ADL's     Past Surgical History   Procedure Laterality Date   ??? Hx endoscopy     ??? Hx tonsillectomy     ??? Hx appendectomy     ??? Hx orthopaedic       right hip replacement   ??? Hx cholecystectomy       No family history on file.  History     Social History   ??? Marital Status: WIDOWED     Spouse Name: N/A     Number of Children: N/A   ??? Years of Education: N/A     Occupational History   ??? Not on file.     Social History Main Topics   ??? Smoking status: Never Smoker    ??? Smokeless tobacco: Not on file   ??? Alcohol Use: Yes      Comment: social   ??? Drug Use: No   ??? Sexual Activity: No     Other Topics Concern   ??? Not on file      Social History Narrative     Current Outpatient Prescriptions   Medication Sig Dispense Refill   ??? triamcinolone acetonide (KENALOG) 0.025 % ointment Apply  to affected area two (2) times a day for 30 days. use thin layer 30 g 0   ??? pantoprazole (PROTONIX) 40 mg tablet Take 1 Tab by mouth daily. 90 Tab 4   ??? levothyroxine (SYNTHROID) 50 mcg tablet Take 1 Tab by mouth Daily (before breakfast). 90 Tab 3   ??? PARoxetine (PAXIL) 20 mg tablet Take 1 Tab by mouth daily. 90 Tab 3   ??? BD LUER-LOK SYRINGE 3 mL 25 x 1" syrg      ??? estradiol (ESTRACE) 0.01 % (0.1 mg/gram) vaginal  cream 1 gram vaginally 3x/week 42.5 g 6   ??? cyanocobalamin (VITAMIN B12) 1,000 mcg/mL injection 1 mL by IntraMUSCular route every thirty (30) days. 3 Vial 5   ??? ergocalciferol (VITAMIN D2) 50,000 unit capsule Take 50,000 Units by mouth every thirty (30) days.     ??? vitamin E (AQUA GEMS) 400 unit capsule Take  by mouth daily. Last dose 10/10/13     ??? calcium carbonate (CALTREX) 600 mg (1,500 mg) tablet Take 600 mg by mouth daily.     ??? MULTIVITAMIN WITH MINERALS (ONE-A-DAY 50 PLUS PO) Take  by mouth. Last dose 10/10/13         MEDICATIONS:  Current outpatient prescriptions: triamcinolone acetonide (KENALOG) 0.025 % ointment, Apply  to affected area two (2) times a day for 30 days. use thin layer, Disp: 30 g, Rfl: 0;  pantoprazole (PROTONIX) 40 mg tablet, Take 1 Tab by mouth daily., Disp: 90 Tab, Rfl: 4;  levothyroxine (SYNTHROID) 50 mcg tablet, Take 1 Tab by mouth Daily (before breakfast)., Disp: 90 Tab, Rfl: 3  PARoxetine (PAXIL) 20 mg tablet, Take 1 Tab by mouth daily., Disp: 90 Tab, Rfl: 3;  BD LUER-LOK SYRINGE 3 mL 25 x 1" syrg, , Disp: , Rfl: ;  estradiol (ESTRACE) 0.01 % (0.1 mg/gram) vaginal cream, 1 gram vaginally 3x/week, Disp: 42.5 g, Rfl: 6;  cyanocobalamin (VITAMIN B12) 1,000 mcg/mL injection, 1 mL by IntraMUSCular route every thirty (30) days., Disp: 3 Vial, Rfl: 5  ergocalciferol (VITAMIN D2) 50,000 unit capsule, Take 50,000 Units by  mouth every thirty (30) days., Disp: , Rfl: ;  vitamin E (AQUA GEMS) 400 unit capsule, Take  by mouth daily. Last dose 10/10/13, Disp: , Rfl: ;  calcium carbonate (CALTREX) 600 mg (1,500 mg) tablet, Take 600 mg by mouth daily., Disp: , Rfl: ;  MULTIVITAMIN WITH MINERALS (ONE-A-DAY 50 PLUS PO), Take  by mouth. Last dose 10/10/13, Disp: , Rfl:       REVIEW OF SYSTEMS:  GENERAL/CONSTITUTIONAL: negative  HEAD, EYES, EARS, NOSE AND THROAT: jaundice, left eye  CARDIOVASCULAR: negative  RESPIRATORY:  negative   GASTROINTESTINAL: negative  GENITOURINARY: negative  MUSCULOSKELETAL: negative  BREASTS: negative  SKIN:  negative  NEUROLOGIC: negative  PSYCHIATRIC: negative  ENDOCRINE:  negative  HEMATOLOGIC/LYMPHATIC:  negative  ALLERGIC/IMMUNOLOGIC:  negative        PHYSICAL EXAM  Vital Signs - BP 102/68 mmHg   Pulse 90   Ht 5' 7" (1.702 m)   Wt 143 lb (64.864 kg)   BMI 22.39 kg/m2   Constitutional - alert, well appearing, and in no distress.  Eyes - pupils equal and reactive,sclerae are icteric..  Ear, Nose, Mouth, Throat - normal  Examination of oropharynx: oral mucosa moist  Neck - supple, no significant adenopathy.   Respiratory - clear to auscultation, no wheezes, rales or bronchi, symmetric air entry.  Cardiovascular - normal rate, regular rhythm, normal S1, S2, systolic murmur     Gastrointestinal - Abdomen soft, liver and spleen not palpable.  Bowel sounds are present  Back exam - range of motion limited  Musculoskeletal -  DJD changes noted.  Using walker for ambulation  Skin - normal coloration and turgor, no rashes, no suspicious skin lesions noted.  Neurological - Cranial nerves with notation of any deficits. Motor and sensory exam is intact   Extremities - peripheral pulses normal, no pedal edema, no clubbing or cyanosis  Psychiatric - alert, oriented to person, place, and time.      LABS  Results for orders placed or performed during the hospital encounter of 03/12/14   CBC WITH AUTOMATED DIFF    Result Value Ref Range    WBC 3.4 (L) 4.3 - 11.1 K/uL    RBC 4.82 4.05 - 5.25 M/uL    HGB 13.8 11.7 - 15.4 g/dL    HCT 40.5 35.8 - 46.3 %    MCV 84.0 79.6 - 97.8 FL    MCH 28.6 26.1 - 32.9 PG    MCHC 34.1 31.4 - 35.0 g/dL    RDW 14.4 11.9 - 14.6 %    PLATELET 289 150 - 450 K/uL    MPV 9.3 (L) 10.8 - 14.1 FL    DF AUTOMATED      NEUTROPHILS 64 43 - 78 %    LYMPHOCYTES 24 13 - 44 %    MONOCYTES 12 4.0 - 12.0 %    EOSINOPHILS 0 (L) 0.5 - 7.8 %    BASOPHILS 0 0.0 - 2.0 %    IMMATURE GRANULOCYTES 0.3 0.0 - 5.0 %    ABS. NEUTROPHILS 2.2 1.7 - 8.2 K/UL    ABS. LYMPHOCYTES 0.8 0.5 - 4.6 K/UL    ABS. MONOCYTES 0.4 0.1 - 1.3 K/UL    ABS. EOSINOPHILS 0.0 0.0 - 0.8 K/UL    ABS. BASOPHILS 0.0 0.0 - 0.2 K/UL    ABS. IMM. GRANS. 0.0 0.0 - 0.5 K/UL   MAGNESIUM   Result Value Ref Range    Magnesium 2.1 1.8 - 2.4 mg/dL   METABOLIC PANEL, COMPREHENSIVE   Result Value Ref Range    Sodium 140 136 - 145 mmol/L    Potassium 4.1 3.5 - 5.1 mmol/L    Chloride 109 (H) 98 - 107 mmol/L    CO2 25 21 - 32 mmol/L    Anion gap 6 (L) 7 - 16 mmol/L    Glucose 95 65 - 100 mg/dL    BUN 14 8 - 23 MG/DL    Creatinine 0.93 0.6 - 1.0 MG/DL    GFR est AA >60 >60 ml/min/1.67m    GFR est non-AA >60 >60 ml/min/1.781m   Calcium 9.2 8.3 - 10.4 MG/DL    Bilirubin, total 2.2 (H) 0.2 - 1.1 MG/DL    ALT 175 (H) 12 - 65 U/L    AST 104 (H) 15 - 37 U/L    Alk. phosphatase 299 (H) 50 - 136 U/L    Protein, total 6.5 6.3 - 8.2 g/dL    Albumin 3.4 3.2 - 4.6 g/dL    Globulin 3.1 2.3 - 3.5 g/dL    A-G Ratio 1.1 (L) 1.2 - 3.5     LIPASE   Result Value Ref Range    Lipase 256 73 - 393 U/L   AMYLASE   Result Value Ref Range    Amylase 98 25 - 115 U/L   AMMONIA   Result Value Ref Range    Ammonia <10 (L) 11 - 32 UMOL/L   URINE MICROSCOPIC   Result Value Ref Range    WBC 10-20 0 /hpf    RBC 0 0 /hpf    Epithelial cells 10-20 0 /hpf    Bacteria TRACE 0 /hpf    Casts 0 0 /lpf    Crystals 0 0 /LPF    Mucus 2+ (H) 0 /lpf   METABOLIC PANEL, COMPREHENSIVE   Result Value Ref Range     Sodium 142 136 - 145 mmol/L    Potassium 4.0 3.5 - 5.1 mmol/L  Chloride 109 (H) 98 - 107 mmol/L    CO2 25 21 - 32 mmol/L    Anion gap 8 7 - 16 mmol/L    Glucose 107 (H) 65 - 100 mg/dL    BUN 4 (L) 8 - 23 MG/DL    Creatinine 0.96 0.6 - 1.0 MG/DL    GFR est AA >60 >60 ml/min/1.26m    GFR est non-AA 59 (L) >60 ml/min/1.759m   Calcium 8.3 8.3 - 10.4 MG/DL    Bilirubin, total 1.6 (H) 0.2 - 1.1 MG/DL    ALT 111 (H) 12 - 65 U/L    AST 69 (H) 15 - 37 U/L    Alk. phosphatase 223 (H) 50 - 136 U/L    Protein, total 5.5 (L) 6.3 - 8.2 g/dL    Albumin 2.7 (L) 3.2 - 4.6 g/dL    Globulin 2.8 2.3 - 3.5 g/dL    A-G Ratio 1.0 (L) 1.2 - 3.5     POC TROPONIN-I   Result Value Ref Range    Troponin-I (POC) 0 0.0 - 0.08 ng/ml   EKG, 12 LEAD, INITIAL   Result Value Ref Range    Systolic BP  mmHg    Diastolic BP  mmHg    Ventricular Rate 60 BPM    Atrial Rate 60 BPM    P-R Interval 166 ms    QRS Duration 88 ms    Q-T Interval 444 ms    QTC Calculation (Bezet) 444 ms    Calculated P Axis 49 degrees    Calculated R Axis 77 degrees    Calculated T Axis 62 degrees    Diagnosis       !! AGE AND GENDER SPECIFIC ECG ANALYSIS !!  Normal sinus rhythm  Normal ECG  No previous ECGs available  Confirmed by [facility] MATTHEW G ([id number]) on 03/13/2014 7:19:57 AM               IMPRESSION:    ICD-9-CM    1. Jaundice 782.4 AMB POC COMPLETE CBC,AUTOMATED ENTER     AMB POC URINALYSIS DIP STICK AUTO W/O MICRO (CIM)     COLLECTION VENOUS BLOOD,VENIPUNCTURE     METABOLIC PANEL, COMPREHENSIVE   2. COPD (chronic obstructive pulmonary disease) ([place]496    3. Skin rash 782.1 AMB POC COMPLETE CBC,AUTOMATED ENTER          PLAN:  I discussed with patient.  Overall she is feeling better.  Her appetite has improved.  No skin rashes more or less related to residual itching from obstructive jaundice and hyperbilirubinemia.  Advised her that we will continue current medication.  Will have blood drawn.   Comprehensive panel.  Follow-up was recommended.  Fall precaution discussed.  See orders medication.    SUZenovia JarredMD          Dictated using voice recognition software. Proofread, but unrecognized voice recognition errors may exist.

## 2014-04-08 LAB — METABOLIC PANEL, COMPREHENSIVE
A-G Ratio: 2.3 (ref 1.1–2.5)
ALT (SGPT): 12 IU/L (ref 0–32)
AST (SGOT): 22 IU/L (ref 0–40)
Albumin: 4.3 g/dL (ref 3.5–4.7)
Alk. phosphatase: 133 IU/L — ABNORMAL HIGH (ref 39–117)
BUN/Creatinine ratio: 15 (ref 11–26)
BUN: 12 mg/dL (ref 8–27)
Bilirubin, total: 1.3 mg/dL — ABNORMAL HIGH (ref 0.0–1.2)
CO2: 22 mmol/L (ref 18–29)
Calcium: 9.6 mg/dL (ref 8.7–10.3)
Chloride: 103 mmol/L (ref 97–108)
Creatinine: 0.81 mg/dL (ref 0.57–1.00)
GFR est AA: 78 mL/min/{1.73_m2} (ref >59)
GFR est non-AA: 67 mL/min/{1.73_m2} (ref >59)
GLOBULIN, TOTAL: 1.9 g/dL (ref 1.5–4.5)
Glucose: 101 mg/dL — ABNORMAL HIGH (ref 65–99)
Potassium: 4.4 mmol/L (ref 3.5–5.2)
Protein, total: 6.2 g/dL (ref 6.0–8.5)
Sodium: 143 mmol/L (ref 134–144)

## 2014-04-21 NOTE — Progress Notes (Signed)
[facility]  Jaedyn Lard C. Posey Pronto, M.D.  Campbell Riches, M.D.  [place], [place]  Ph No:  [phone]  Fax:  [phone]      CHIEF COMPLAINT: follow-up visit         HISTORY OF PRESENT ILLNESS: Ms. Trego is a 80 y.o. female with past medical history COPD, obstructive jaundice, history of cholecystectomy.  She is in office today, but daughter stating that she is feeling better.  He denies any nausea, vomiting, no cough, congestion.  Her appetite has been better.  He had a blood drawn recently which revealed normalization of her bilirubin.  Urine has been clear      HISTORY:  Allergies   Allergen Reactions   ??? Codeine Nausea Only     Past Medical History   Diagnosis Date   ??? Anemia    ??? Arthritis    ??? Cardiomegaly      d/t age;  asymptomatic   ??? HH (hiatus hernia)    ??? Osteoarthritis    ??? Gastritis    ??? Anemia, pernicious      since childhood   ??? Hiatal hernia    ??? Chronic obstructive pulmonary disease (HCC)      peer xray; asymptomatic; very mild/ no treatment   ??? GERD (gastroesophageal reflux disease)    ??? Thyroid disease      hypothyroid   ??? 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 Depression    ??? Dementia      with short term memory loss-needs assist with ADL's     Past Surgical History   Procedure Laterality Date   ??? Hx endoscopy     ??? Hx tonsillectomy     ??? Hx appendectomy     ??? Hx orthopaedic       right hip replacement   ??? Hx cholecystectomy       No family history on file.  History     Social History   ??? Marital Status: WIDOWED     Spouse Name: N/A     Number of Children: N/A   ??? Years of Education: N/A     Occupational History   ??? Not on file.     Social History Main Topics   ??? Smoking status: Never Smoker    ??? Smokeless tobacco: Not on file   ??? Alcohol Use: Yes      Comment: social   ??? Drug Use: No   ??? Sexual Activity: No     Other Topics Concern   ??? Not on file      Social History Narrative     Current Outpatient Prescriptions   Medication Sig Dispense Refill   ??? triamcinolone acetonide (KENALOG) 0.025 % ointment Apply  to affected area two (2) times a day for 30 days. use thin layer 30 g 0   ??? pantoprazole (PROTONIX) 40 mg tablet Take 1 Tab by mouth daily. 90 Tab 4   ??? levothyroxine (SYNTHROID) 50 mcg tablet Take 1 Tab by mouth Daily (before breakfast). 90 Tab 3   ??? PARoxetine (PAXIL) 20 mg tablet Take 1 Tab by mouth daily. 90 Tab 3   ??? BD LUER-LOK SYRINGE 3 mL 25 x 1" syrg      ??? estradiol (ESTRACE) 0.01 % (0.1 mg/gram) vaginal cream 1 gram vaginally 3x/week  42.5 g 6   ??? cyanocobalamin (VITAMIN B12) 1,000 mcg/mL injection 1 mL by IntraMUSCular route every thirty (30) days. 3 Vial 5   ??? ergocalciferol (VITAMIN D2) 50,000 unit capsule Take 50,000 Units by mouth every thirty (30) days.     ??? vitamin E (AQUA GEMS) 400 unit capsule Take  by mouth daily. Last dose 10/10/13     ??? calcium carbonate (CALTREX) 600 mg (1,500 mg) tablet Take 600 mg by mouth daily.     ??? MULTIVITAMIN WITH MINERALS (ONE-A-DAY 50 PLUS PO) Take  by mouth. Last dose 10/10/13         MEDICATIONS:  Current outpatient prescriptions: triamcinolone acetonide (KENALOG) 0.025 % ointment, Apply  to affected area two (2) times a day for 30 days. use thin layer, Disp: 30 g, Rfl: 0;  pantoprazole (PROTONIX) 40 mg tablet, Take 1 Tab by mouth daily., Disp: 90 Tab, Rfl: 4;  levothyroxine (SYNTHROID) 50 mcg tablet, Take 1 Tab by mouth Daily (before breakfast)., Disp: 90 Tab, Rfl: 3  PARoxetine (PAXIL) 20 mg tablet, Take 1 Tab by mouth daily., Disp: 90 Tab, Rfl: 3;  BD LUER-LOK SYRINGE 3 mL 25 x 1" syrg, , Disp: , Rfl: ;  estradiol (ESTRACE) 0.01 % (0.1 mg/gram) vaginal cream, 1 gram vaginally 3x/week, Disp: 42.5 g, Rfl: 6;  cyanocobalamin (VITAMIN B12) 1,000 mcg/mL injection, 1 mL by IntraMUSCular route every thirty (30) days., Disp: 3 Vial, Rfl: 5  ergocalciferol (VITAMIN D2) 50,000 unit capsule, Take 50,000 Units by  mouth every thirty (30) days., Disp: , Rfl: ;  vitamin E (AQUA GEMS) 400 unit capsule, Take  by mouth daily. Last dose 10/10/13, Disp: , Rfl: ;  calcium carbonate (CALTREX) 600 mg (1,500 mg) tablet, Take 600 mg by mouth daily., Disp: , Rfl: ;  MULTIVITAMIN WITH MINERALS (ONE-A-DAY 50 PLUS PO), Take  by mouth. Last dose 10/10/13, Disp: , Rfl:       REVIEW OF SYSTEMS:  GENERAL/CONSTITUTIONAL: negative  HEAD, EYES, EARS, NOSE AND THROAT: negative  CARDIOVASCULAR: negative  RESPIRATORY:  negative   GASTROINTESTINAL: negative  GENITOURINARY: negative  MUSCULOSKELETAL: negative  BREASTS: negative  SKIN:  negative  NEUROLOGIC: negative  PSYCHIATRIC: negative  ENDOCRINE:  negative  HEMATOLOGIC/LYMPHATIC:  negative  ALLERGIC/IMMUNOLOGIC:  negative        PHYSICAL EXAM  Vital Signs - BP 90/62 mmHg   Pulse 112   Ht '5\' 7"'  (1.702 m)   Wt 140 lb (63.504 kg)   BMI 21.92 kg/m2   Constitutional - alert, well appearing, and in no distress.  Eyes - pupils equal and reactive, extraocular eye movements intact.  Ear, Nose, Mouth, Throat - normal  Examination of oropharynx: oral mucosa moist  Neck - supple, no significant adenopathy.   Respiratory - clear to auscultation, no wheezes, rales or bronchi, symmetric air entry.  Cardiovascular - normal rate, regular rhythm, normal S1, S2,  systolic murmur     Gastrointestinal - Abdomen soft, nontender, nondistended, no masses.  Back exam -range of motion limited  Musculoskeletal -  Degenerative changes noted  Skin - normal coloration and turgor, no rashes, no suspicious skin lesions noted.  Neurological - Cranial nerves with notation of any deficits. Motor and sensory exam is intact   Extremities - peripheral pulses normal, no pedal edema, no clubbing or cyanosis  Psychiatric - alert, oriented to person, place, and time.      LABS  Results for orders placed or performed in visit on 24/40/10   METABOLIC PANEL, COMPREHENSIVE  Result Value Ref Range    Glucose 101 (H) 65 - 99 mg/dL     BUN 12 8 - 27 mg/dL    Creatinine 0.81 0.57 - 1.00 mg/dL    GFR est non-AA 67 >59 mL/min/1.73    GFR est AA 78 >59 mL/min/1.73    BUN/Creatinine ratio 15 11 - 26    Sodium 143 134 - 144 mmol/L    Potassium 4.4 3.5 - 5.2 mmol/L    Chloride 103 97 - 108 mmol/L    CO2 22 18 - 29 mmol/L    Calcium 9.6 8.7 - 10.3 mg/dL    Protein, total 6.2 6.0 - 8.5 g/dL    Albumin 4.3 3.5 - 4.7 g/dL    GLOBULIN, TOTAL 1.9 1.5 - 4.5 g/dL    A-G Ratio 2.3 1.1 - 2.5    Bilirubin, total 1.3 (H) 0.0 - 1.2 mg/dL    Alk. phosphatase 133 (H) 39 - 117 IU/L    AST 22 0 - 40 IU/L    ALT 12 0 - 32 IU/L   AMB POC COMPLETE CBC,AUTOMATED ENTER   Result Value Ref Range    WBC (POC) 4.7 4.5 - 10.5 10^3/ul    LYMPHOCYTES (POC) 24.2 20.5 - 51.1 %    MONOCYTES (POC) 5.8 1.7 - 9.3 %    GRANULOCYTES (POC) 70.0 42.2 - 75.2 %    ABS. LYMPHS (POC) 1.1 (A) 1.2 - 3.4 10^3/ul    ABS. MONOS (POC) 0.3 0.1 - 0.6 10^3/ul    ABS. GRANS (POC) 3.3 1.4 - 6.5 10^3/ul    RBC (POC) 5.51 4 - 6 10^6/ul    HGB (POC) 15.0 11 - 18 g/dL    HCT (POC) 46.2 35 - 60 %    MCV (POC) 83.8 80 - 99.9 fL    MCH (POC) 27.3 27 - 31 pg    MCHC (POC) 32.5 (A) 33 - 37 g/dL    RDW (POC) 15.0 (A) 11.6 - 13.7 %    PLATELET (POC) 293 150 - 450 10^3/ul    MPV (POC) 6.8 (A) 7.8 - 11 fL   AMB POC URINALYSIS DIP STICK AUTO W/O MICRO (CIM)   Result Value Ref Range    Color (UA POC) Dark Amber     Clarity (UA POC) Slightly Cloudy     Glucose (UA POC) Negative Negative mg/dL    Bilirubin (UA POC) Moderate Negative    Ketones (UA POC) 15  Negative    Specific gravity (UA POC) 1.020 1.001 - 1.035    Blood (UA POC) Moderate Negative    pH (UA POC) 5.5 4.6 - 8.0    Protein (UA POC) 100  Negative    Urobilinogen (UA POC) 1 mg/dL 0.2 - 1    Nitrites (UA POC) Negative Negative    Leukocyte esterase (UA POC) Small Negative    Protein Total Urine 30     Creatinine 300 mg/dL    Protein-Creat Ratio 150 mg/g             IMPRESSION:    ICD-9-CM    1. Gastroesophageal reflux disease without esophagitis 530.81     2. COPD (chronic obstructive pulmonary disease) ([place]) 496    3. Jaundice 782.4    4. Anxiety 300.00    5. Abnormal liver function 573.9           PLAN:     Discussed with patient, reviewed the lab work done recently.  Continue current medication.  Her  jaundice has improved.  Her COPD has been stable.  He is advised to continue diet.  Follow-up is recommended. We also discussed regarding her follow-up with pulmonologist.   Continue follow-up with pediatrician.  Given copy of her lab work.    Zenovia Jarred, MD          Dictated using voice recognition software. Proofread, but unrecognized voice recognition errors may exist.

## 2014-07-10 MED ORDER — OXAZEPAM 10 MG CAP
10 mg | ORAL_CAPSULE | ORAL | Status: DC
Start: 2014-07-10 — End: 2014-09-12

## 2014-07-16 ENCOUNTER — Inpatient Hospital Stay
Admit: 2014-07-16 | Discharge: 2014-07-16 | Disposition: A | Discharge status: Home / Self Care | Payer: MEDICARE | Attending: Emergency Medicine

## 2014-07-16 ENCOUNTER — Emergency Department: Admit: 2014-07-16 | Payer: MEDICARE | Primary: Family Medicine

## 2014-07-16 DIAGNOSIS — R200 Anesthesia of skin: Secondary | ICD-10-CM

## 2014-07-16 LAB — PROTHROMBIN TIME + INR
INR: 1 (ref 0.9–1.2)
Prothrombin time: 10.7 s (ref 9.6–12.0)

## 2014-07-16 LAB — METABOLIC PANEL, COMPREHENSIVE
A-G Ratio: 1 — ABNORMAL LOW (ref 1.2–3.5)
ALT (SGPT): 25 U/L (ref 12–65)
AST (SGOT): 29 U/L (ref 15–37)
Albumin: 3.4 g/dL (ref 3.2–4.6)
Alk. phosphatase: 115 U/L (ref 50–136)
Anion gap: 7 mmol/L (ref 7–16)
BUN: 13 MG/DL (ref 8–23)
Bilirubin, total: 0.7 MG/DL (ref 0.2–1.1)
CO2: 23 mmol/L (ref 21–32)
Calcium: 8.3 MG/DL (ref 8.3–10.4)
Chloride: 111 mmol/L — ABNORMAL HIGH (ref 98–107)
Creatinine: 0.88 MG/DL (ref 0.6–1.0)
GFR est AA: >60 mL/min/{1.73_m2} (ref >60)
GFR est non-AA: >60 mL/min/{1.73_m2} (ref >60)
Globulin: 3.3 g/dL (ref 2.3–3.5)
Glucose: 104 mg/dL — ABNORMAL HIGH (ref 65–100)
Potassium: 3.8 mmol/L (ref 3.5–5.1)
Protein, total: 6.7 g/dL (ref 6.3–8.2)
Sodium: 141 mmol/L (ref 136–145)

## 2014-07-16 LAB — CBC WITH AUTOMATED DIFF
ABS. BASOPHILS: 0 10*3/uL (ref 0.0–0.2)
ABS. EOSINOPHILS: 0.1 10*3/uL (ref 0.0–0.8)
ABS. IMM. GRANS.: 0 10*3/uL (ref 0.0–0.5)
ABS. LYMPHOCYTES: 0.9 10*3/uL (ref 0.5–4.6)
ABS. MONOCYTES: 0.3 10*3/uL (ref 0.1–1.3)
ABS. NEUTROPHILS: 2.6 10*3/uL (ref 1.7–8.2)
BASOPHILS: 0 % (ref 0.0–2.0)
EOSINOPHILS: 1 % (ref 0.5–7.8)
HCT: 40 % (ref 35.8–46.3)
HGB: 13.6 g/dL (ref 11.7–15.4)
IMMATURE GRANULOCYTES: 0.3 % (ref 0.0–5.0)
LYMPHOCYTES: 24 % (ref 13–44)
MCH: 28.6 PG (ref 26.1–32.9)
MCHC: 34 g/dL (ref 31.4–35.0)
MCV: 84 FL (ref 79.6–97.8)
MONOCYTES: 8 % (ref 4.0–12.0)
MPV: 9.5 FL — ABNORMAL LOW (ref 10.8–14.1)
NEUTROPHILS: 67 % (ref 43–78)
PLATELET: 205 10*3/uL (ref 150–450)
RBC: 4.76 M/uL (ref 4.05–5.25)
RDW: 12.7 % (ref 11.9–14.6)
WBC: 3.9 10*3/uL — ABNORMAL LOW (ref 4.3–11.1)

## 2014-07-16 LAB — POC TROPONIN: Troponin-I (POC): 0 ng/ml (ref 0.0–0.08)

## 2014-07-16 NOTE — Progress Notes (Signed)
Met with patient to assess.  Patient's daughter at bedside.  Per daughter, patient lives at home, she has an aide that assist twice a daily and she assist in mom's care also.  Per daughter, patient uses a walker to ambulate and is not on any Home O2.  Patient sees Dr. Posey Pronto (PCP- last visit 3 mths ago).  Patient uses [facility] and [facility] for medications .  Per daughter patient was at [facility] approximately 1 yr ago for rehab after a surgery.

## 2014-07-16 NOTE — ED Provider Notes (Addendum)
Patient is a 80 y.o. female presenting with numbness. The history is provided by the patient.   Numbness  This is a new problem. The current episode started 3 to 5 hours ago. The problem has not changed since onset.There was left upper extremity and left facial focality noted. Pertinent negatives include no focal weakness, no slurred speech, no speech difficulty, no movement disorder, no mental status change and no unresponsiveness. There has been no fever. Pertinent negatives include no shortness of breath, no chest pain, no vomiting, no altered mental status, no confusion, no headaches, no choking, no nausea and no bowel incontinence. Associated medical issues do not include trauma, seizures or dementia.        Past Medical History   Diagnosis Date   ??? Anemia    ??? Arthritis    ??? Cardiomegaly      d/t age;  asymptomatic   ??? HH (hiatus hernia)    ??? Osteoarthritis    ??? Gastritis    ??? Anemia, pernicious      since childhood   ??? Hiatal hernia    ??? Chronic obstructive pulmonary disease (HCC)      peer xray; asymptomatic; very mild/ no treatment   ??? GERD (gastroesophageal reflux disease)    ??? Thyroid disease      hypothyroid   ??? 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 Depression    ??? Dementia      with short term memory loss-needs assist with ADL's        Past Surgical History   Procedure Laterality Date   ??? Hx endoscopy     ??? Hx tonsillectomy     ??? Hx appendectomy     ??? Hx orthopaedic       right hip replacement   ??? Hx cholecystectomy           History reviewed. No pertinent family history.     History     Social History   ??? Marital Status: WIDOWED     Spouse Name: N/A     Number of Children: N/A   ??? Years of Education: N/A     Occupational History   ??? Not on file.     Social History Main Topics   ??? Smoking status: Never Smoker    ??? Smokeless tobacco: Not on file   ??? Alcohol Use: Yes      Comment: social   ??? Drug Use: No    ??? Sexual Activity: No     Other Topics Concern   ??? Not on file     Social History Narrative                  ALLERGIES: Codeine      Review of Systems   Constitutional: Negative for fever, diaphoresis and fatigue.   HENT: Negative for drooling, ear discharge, sore throat and voice change.    Eyes: Negative for photophobia, redness and visual disturbance.   Respiratory: Negative for choking, shortness of breath and stridor.    Cardiovascular: Negative for chest pain and leg swelling.   Gastrointestinal: Negative for nausea, vomiting, abdominal pain and bowel incontinence.   Endocrine: Negative for polydipsia, polyphagia and polyuria.   Genitourinary: Negative for dysuria, urgency and flank pain.   Musculoskeletal: Negative for back pain and gait problem.   Skin: Negative for pallor and rash.  Allergic/Immunologic: Negative for food allergies and immunocompromised state.   Neurological: Positive for numbness. Negative for tremors, focal weakness, speech difficulty and headaches.   Hematological: Negative for adenopathy. Does not bruise/bleed easily.   Psychiatric/Behavioral: Negative for confusion.   All other systems reviewed and are negative.      Filed Vitals:    07/16/14 1145   BP: 206/97   Pulse: 97   Temp: 97.9 ??F (36.6 ??C)   Resp: 16   Height: '5\' 7"'  (1.702 m)   Weight: 63.504 kg (140 lb)   SpO2: 96%            Physical Exam   Constitutional: She is oriented to person, place, and time. She appears well-developed and well-nourished. No distress.   HENT:   Head: Normocephalic and atraumatic.   Right Ear: External ear normal.   Left Ear: External ear normal.   Nose: Nose normal.   Mouth/Throat: Oropharynx is clear and moist. No oropharyngeal exudate.   Eyes: Conjunctivae and EOM are normal. Pupils are equal, round, and reactive to light. Right eye exhibits no discharge. Left eye exhibits no discharge. No scleral icterus.   Neck: Normal range of motion. Neck supple. No JVD present. No tracheal  deviation present. No thyromegaly present.   Cardiovascular: Normal rate, regular rhythm, normal heart sounds and intact distal pulses.  Exam reveals no gallop and no friction rub.    No murmur heard.  Pulmonary/Chest: Effort normal and breath sounds normal. No stridor. No respiratory distress. She has no wheezes. She has no rales. She exhibits no tenderness.   Abdominal: Soft. Bowel sounds are normal. She exhibits no distension and no mass. There is no tenderness. There is no rebound and no guarding.   Musculoskeletal: Normal range of motion. She exhibits no edema or tenderness.   Lymphadenopathy:     She has no cervical adenopathy.   Neurological: She is alert and oriented to person, place, and time. She has normal reflexes. No cranial nerve deficit. She exhibits normal muscle tone. Coordination normal.   Skin: Skin is warm and dry. No rash noted. She is not diaphoretic. No erythema. No pallor.   Psychiatric: She has a normal mood and affect. Her behavior is normal. Judgment and thought content normal.   Nursing note and vitals reviewed.       MDM  Number of Diagnoses or Management Options  Diagnosis management comments: Patient's neuro exam here essentially is normal  We'll obtain CT scan of the patient's head and monitor symptoms for possible improvement       Amount and/or Complexity of Data Reviewed  Clinical lab tests: ordered and reviewed  Tests in the radiology section of CPT??: ordered and reviewed    Risk of Complications, Morbidity, and/or Mortality  Presenting problems: moderate  Diagnostic procedures: moderate  Management options: moderate        Procedures    Results Include:    Recent Results (from the past 24 hour(s))   POC TROPONIN-I    Collection Time: 07/16/14 11:52 AM   Result Value Ref Range    Troponin-I (POC) 0 0.0 - 0.08 ng/ml   CBC WITH AUTOMATED DIFF    Collection Time: 07/16/14 12:00 PM   Result Value Ref Range    WBC 3.9 (L) 4.3 - 11.1 K/uL    RBC 4.76 4.05 - 5.25 M/uL     HGB 13.6 11.7 - 15.4 g/dL    HCT 40.0 35.8 - 46.3 %    MCV 84.0 79.6 -  97.8 FL    MCH 28.6 26.1 - 32.9 PG    MCHC 34.0 31.4 - 35.0 g/dL    RDW 12.7 11.9 - 14.6 %    PLATELET 205 150 - 450 K/uL    MPV 9.5 (L) 10.8 - 14.1 FL    DF AUTOMATED      NEUTROPHILS 67 43 - 78 %    LYMPHOCYTES 24 13 - 44 %    MONOCYTES 8 4.0 - 12.0 %    EOSINOPHILS 1 0.5 - 7.8 %    BASOPHILS 0 0.0 - 2.0 %    IMMATURE GRANULOCYTES 0.3 0.0 - 5.0 %    ABS. NEUTROPHILS 2.6 1.7 - 8.2 K/UL    ABS. LYMPHOCYTES 0.9 0.5 - 4.6 K/UL    ABS. MONOCYTES 0.3 0.1 - 1.3 K/UL    ABS. EOSINOPHILS 0.1 0.0 - 0.8 K/UL    ABS. BASOPHILS 0.0 0.0 - 0.2 K/UL    ABS. IMM. GRANS. 0.0 0.0 - 0.5 K/UL   METABOLIC PANEL, COMPREHENSIVE    Collection Time: 07/16/14 12:00 PM   Result Value Ref Range    Sodium 141 136 - 145 mmol/L    Potassium 3.8 3.5 - 5.1 mmol/L    Chloride 111 (H) 98 - 107 mmol/L    CO2 23 21 - 32 mmol/L    Anion gap 7 7 - 16 mmol/L    Glucose 104 (H) 65 - 100 mg/dL    BUN 13 8 - 23 MG/DL    Creatinine 0.88 0.6 - 1.0 MG/DL    GFR est AA >60 >60 ml/min/1.82m    GFR est non-AA >60 >60 ml/min/1.723m   Calcium 8.3 8.3 - 10.4 MG/DL    Bilirubin, total 0.7 0.2 - 1.1 MG/DL    ALT 25 12 - 65 U/L    AST 29 15 - 37 U/L    Alk. phosphatase 115 50 - 136 U/L    Protein, total 6.7 6.3 - 8.2 g/dL    Albumin 3.4 3.2 - 4.6 g/dL    Globulin 3.3 2.3 - 3.5 g/dL    A-G Ratio 1.0 (L) 1.2 - 3.5     PROTHROMBIN TIME + INR    Collection Time: 07/16/14 12:00 PM   Result Value Ref Range    Prothrombin time 10.7 9.6 - 12.0 sec    INR 1.0 0.9 - 1.2         3:05 PM  Patient reports left hand numbness and left leg numbness have resolved.  She does still report some perioral numbness.  Discussed in detail with the patient and daughter results of CT scan.  Discussed the possible necessity for further workup to include MRI and carotid imaging.  Patient and daughter are agreeable for outpatient workup.  They will call her  primary care physician to arrange further follow-up.  She is to return to emergency department immediately for any new or worsening symptoms.

## 2014-07-16 NOTE — ED Notes (Signed)
I have reviewed discharge instructions with the patient.  The patient verbalized understanding.      Pt given RX for following medications at discharge @MEDDISCHARGE@.  Pt discharged ambulatory from emergency department in no acute distress.

## 2014-07-16 NOTE — ED Notes (Signed)
Pt resting on stretcher and remains on monitors waiting for further orders.

## 2014-07-16 NOTE — ED Notes (Signed)
Patient states left sided facial numbness, hand numbness and leg numbness. Patient smile symmetrical in triage. Dr. Owens Shark to bedside. Time frame unknown when started.

## 2014-07-21 ENCOUNTER — Ambulatory Visit: Admit: 2014-07-21 | Discharge: 2014-07-21 | Payer: MEDICARE | Attending: Specialist | Primary: Family Medicine

## 2014-07-21 DIAGNOSIS — J449 Chronic obstructive pulmonary disease, unspecified: Secondary | ICD-10-CM

## 2014-07-21 MED ORDER — ZOSTER VACCINE LIVE (PF) 19,400 UNIT SUB-Q SOLN
19400 unit/0.65 mL | Freq: Once | SUBCUTANEOUS | Status: AC
Start: 2014-07-21 — End: 2014-07-21

## 2014-07-21 NOTE — Progress Notes (Signed)
[facility]  Kambria Grima C. Posey Pronto, M.D.  Campbell Riches, M.D.  [place], [place]  Ph No:  [phone]  Fax:  [phone]      CHIEF COMPLAINT: follow-up         HISTORY OF PRESENT ILLNESS: Jo Simmons is a 80 y.o. female with past medical history positive for cholecystectomy, history of obstructive jaundice, history of dementia, COPD, history of hypertension.  She is seen in office today accompanied by her daughter stating that mother had some numbness in her face.  She was evaluated in the emergency room about 3 weeks ago.  They did a CT scan that was unremarkable.  She also do blood studies completed.  She was advised to come here for follow-up.  She had been having more problems with numbness.  She denies any fever, chills.  Denies any coughing, congestion, no palpitation.  Denies any blood in the stool or black colored stool.  Her appetite has been fair.  She would like to get her flu vaccine today.      HISTORY:  Allergies   Allergen Reactions   ??? Codeine Nausea Only     Past Medical History   Diagnosis Date   ??? Anemia    ??? Arthritis    ??? Cardiomegaly      d/t age;  asymptomatic   ??? HH (hiatus hernia)    ??? Osteoarthritis    ??? Gastritis    ??? Anemia, pernicious      since childhood   ??? Hiatal hernia    ??? Chronic obstructive pulmonary disease (HCC)      peer xray; asymptomatic; very mild/ no treatment   ??? GERD (gastroesophageal reflux disease)    ??? Thyroid disease      hypothyroid   ??? 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 Depression    ??? Dementia      with short term memory loss-needs assist with ADL's     Past Surgical History   Procedure Laterality Date   ??? Hx endoscopy     ??? Hx tonsillectomy     ??? Hx appendectomy     ??? Hx orthopaedic       right hip replacement   ??? Hx cholecystectomy       History reviewed. No pertinent family history.  History     Social History    ??? Marital Status: WIDOWED     Spouse Name: N/A     Number of Children: N/A   ??? Years of Education: N/A     Occupational History   ??? Not on file.     Social History Main Topics   ??? Smoking status: Never Smoker    ??? Smokeless tobacco: Not on file   ??? Alcohol Use: Yes      Comment: social   ??? Drug Use: No   ??? Sexual Activity: No     Other Topics Concern   ??? Not on file     Social History Narrative     Current Outpatient Prescriptions   Medication Sig Dispense Refill   ??? docusate sodium (COLACE) 100 mg capsule Take 100 mg by mouth two (2) times a day.     ??? hydrOXYzine (ATARAX) 50 mg tablet Take 10 mg by mouth three (3) times daily as needed for Itching.     ???  budesonide-formoterol (SYMBICORT) 160-4.5 mcg/actuation HFA inhaler Take 2 Puffs by inhalation two (2) times a day.     ??? varicella zoster vacine live (VARICELLA-ZOSTER VACINE LIVE) 19,400 unit/0.65 mL susr injection 1 Vial by SubCUTAneous route once for 1 dose. 0.65 mL 0   ??? oxazepam (SERAX) 10 mg capsule TAKE ONE CAPSULE BY MOUTH NIGHTLY AS NEEDED FOR SLEEP FOR ANXIETY *MAX  1  CAPSULE  PER  DAY* 30 Cap 1   ??? pantoprazole (PROTONIX) 40 mg tablet Take 1 Tab by mouth daily. 90 Tab 4   ??? levothyroxine (SYNTHROID) 50 mcg tablet Take 1 Tab by mouth Daily (before breakfast). 90 Tab 3   ??? PARoxetine (PAXIL) 20 mg tablet Take 1 Tab by mouth daily. 90 Tab 3   ??? BD LUER-LOK SYRINGE 3 mL 25 x 1" syrg      ??? estradiol (ESTRACE) 0.01 % (0.1 mg/gram) vaginal cream 1 gram vaginally 3x/week 42.5 g 6   ??? cyanocobalamin (VITAMIN B12) 1,000 mcg/mL injection 1 mL by IntraMUSCular route every thirty (30) days. 3 Vial 5   ??? ergocalciferol (VITAMIN D2) 50,000 unit capsule Take 50,000 Units by mouth every thirty (30) days.     ??? vitamin E (AQUA GEMS) 400 unit capsule Take  by mouth daily. Last dose 10/10/13     ??? calcium carbonate (CALTREX) 600 mg (1,500 mg) tablet Take 600 mg by mouth daily.     ??? MULTIVITAMIN WITH MINERALS (ONE-A-DAY 50 PLUS PO) Take  by mouth. Last dose 10/10/13          MEDICATIONS:  Current outpatient prescriptions: docusate sodium (COLACE) 100 mg capsule, Take 100 mg by mouth two (2) times a day., Disp: , Rfl: ;  hydrOXYzine (ATARAX) 50 mg tablet, Take 10 mg by mouth three (3) times daily as needed for Itching., Disp: , Rfl: ;  budesonide-formoterol (SYMBICORT) 160-4.5 mcg/actuation HFA inhaler, Take 2 Puffs by inhalation two (2) times a day., Disp: , Rfl:   varicella zoster vacine live (VARICELLA-ZOSTER VACINE LIVE) 19,400 unit/0.65 mL susr injection, 1 Vial by SubCUTAneous route once for 1 dose., Disp: 0.65 mL, Rfl: 0;  oxazepam (SERAX) 10 mg capsule, TAKE ONE CAPSULE BY MOUTH NIGHTLY AS NEEDED FOR SLEEP FOR ANXIETY *MAX  1  CAPSULE  PER  DAY*, Disp: 30 Cap, Rfl: 1;  pantoprazole (PROTONIX) 40 mg tablet, Take 1 Tab by mouth daily., Disp: 90 Tab, Rfl: 4  levothyroxine (SYNTHROID) 50 mcg tablet, Take 1 Tab by mouth Daily (before breakfast)., Disp: 90 Tab, Rfl: 3;  PARoxetine (PAXIL) 20 mg tablet, Take 1 Tab by mouth daily., Disp: 90 Tab, Rfl: 3;  BD LUER-LOK SYRINGE 3 mL 25 x 1" syrg, , Disp: , Rfl: ;  estradiol (ESTRACE) 0.01 % (0.1 mg/gram) vaginal cream, 1 gram vaginally 3x/week, Disp: 42.5 g, Rfl: 6  cyanocobalamin (VITAMIN B12) 1,000 mcg/mL injection, 1 mL by IntraMUSCular route every thirty (30) days., Disp: 3 Vial, Rfl: 5;  ergocalciferol (VITAMIN D2) 50,000 unit capsule, Take 50,000 Units by mouth every thirty (30) days., Disp: , Rfl: ;  vitamin E (AQUA GEMS) 400 unit capsule, Take  by mouth daily. Last dose 10/10/13, Disp: , Rfl: ;  calcium carbonate (CALTREX) 600 mg (1,500 mg) tablet, Take 600 mg by mouth daily., Disp: , Rfl:   MULTIVITAMIN WITH MINERALS (ONE-A-DAY 50 PLUS PO), Take  by mouth. Last dose 10/10/13, Disp: , Rfl:       REVIEW OF SYSTEMS:  GENERAL/CONSTITUTIONAL: negative  HEAD, EYES, EARS, NOSE AND THROAT:  negative  CARDIOVASCULAR: negative  RESPIRATORY:  negative   GASTROINTESTINAL: negative  GENITOURINARY: negative  MUSCULOSKELETAL: negative   BREASTS: negative  SKIN:  negative  NEUROLOGIC: negative  PSYCHIATRIC: negative  ENDOCRINE:  negative  HEMATOLOGIC/LYMPHATIC:  negative  ALLERGIC/IMMUNOLOGIC:  negative        PHYSICAL EXAM  Vital Signs - BP 144/88 mmHg   Pulse 96   Ht '5\' 7"'  (1.702 m)   Wt 150 lb (68.04 kg)   BMI 23.49 kg/m2   Constitutional - alert, well appearing, and in no distress.  Eyes - pupils equal and reactive, extraocular eye movements intact.  Ear, Nose, Mouth, Throat - normal  Examination of oropharynx: oral mucosa moist  Neck - supple, no significant adenopathy.   Respiratory - clear to auscultation, no wheezes, rales or bronchi, symmetric air entry.  Cardiovascular - normal rate, regular rhythm, normal S1, S2, systolic murmur grade 2/6 mitral area     Gastrointestinal - Abdomen soft, nontender, nondistended, no masses.  Back exam - DJD changes noted.  Range of motion limited  Musculoskeletal -  DJD changes noted.  Uses walker for ambulation  Skin - normal coloration and turgor, no rashes, no suspicious skin lesions noted.  Neurological - Cranial nerves with notation of any deficits. Motor and sensory exam is intact   Extremities - peripheral pulses normal, no pedal edema, no clubbing or cyanosis  Psychiatric - alert and awake, confused at times.   Short-term memory is impaired.Marland Kitchen    LABS  Results for orders placed or performed during the hospital encounter of 07/16/14   CBC WITH AUTOMATED DIFF   Result Value Ref Range    WBC 3.9 (L) 4.3 - 11.1 K/uL    RBC 4.76 4.05 - 5.25 M/uL    HGB 13.6 11.7 - 15.4 g/dL    HCT 40.0 35.8 - 46.3 %    MCV 84.0 79.6 - 97.8 FL    MCH 28.6 26.1 - 32.9 PG    MCHC 34.0 31.4 - 35.0 g/dL    RDW 12.7 11.9 - 14.6 %    PLATELET 205 150 - 450 K/uL    MPV 9.5 (L) 10.8 - 14.1 FL    DF AUTOMATED      NEUTROPHILS 67 43 - 78 %    LYMPHOCYTES 24 13 - 44 %    MONOCYTES 8 4.0 - 12.0 %    EOSINOPHILS 1 0.5 - 7.8 %    BASOPHILS 0 0.0 - 2.0 %    IMMATURE GRANULOCYTES 0.3 0.0 - 5.0 %    ABS. NEUTROPHILS 2.6 1.7 - 8.2 K/UL     ABS. LYMPHOCYTES 0.9 0.5 - 4.6 K/UL    ABS. MONOCYTES 0.3 0.1 - 1.3 K/UL    ABS. EOSINOPHILS 0.1 0.0 - 0.8 K/UL    ABS. BASOPHILS 0.0 0.0 - 0.2 K/UL    ABS. IMM. GRANS. 0.0 0.0 - 0.5 K/UL   METABOLIC PANEL, COMPREHENSIVE   Result Value Ref Range    Sodium 141 136 - 145 mmol/L    Potassium 3.8 3.5 - 5.1 mmol/L    Chloride 111 (H) 98 - 107 mmol/L    CO2 23 21 - 32 mmol/L    Anion gap 7 7 - 16 mmol/L    Glucose 104 (H) 65 - 100 mg/dL    BUN 13 8 - 23 MG/DL    Creatinine 0.88 0.6 - 1.0 MG/DL    GFR est AA >60 >60 ml/min/1.65m    GFR est non-AA >60 >60  ml/min/1.84m    Calcium 8.3 8.3 - 10.4 MG/DL    Bilirubin, total 0.7 0.2 - 1.1 MG/DL    ALT 25 12 - 65 U/L    AST 29 15 - 37 U/L    Alk. phosphatase 115 50 - 136 U/L    Protein, total 6.7 6.3 - 8.2 g/dL    Albumin 3.4 3.2 - 4.6 g/dL    Globulin 3.3 2.3 - 3.5 g/dL    A-G Ratio 1.0 (L) 1.2 - 3.5     PROTHROMBIN TIME + INR   Result Value Ref Range    Prothrombin time 10.7 9.6 - 12.0 sec    INR 1.0 0.9 - 1.2     POC TROPONIN-I   Result Value Ref Range    Troponin-I (POC) 0 0.0 - 0.08 ng/ml             IMPRESSION:    ICD-10-CM ICD-9-CM    1. COPD (chronic obstructive pulmonary disease) ([place] J44.9 496    2. Numbness R20.0 782.0    3. Depression F32.9 311    4. Gastroesophageal reflux disease without esophagitis K21.9 530.81    5. Dementia, without behavioral disturbance F03.90 294.20    6. B12 deficiency E53.8 266.2    7. Encounter for immunization Z23 V03.89 ADMIN INFLUENZA VIRUS VAC      INFLUENZA VIRUS VACCINE,(SEASONAL),SPLIT, IN INDIVIDS. >=3 YRS OF AGE, IM          PLAN:  I discussed with patient and reviewed the finding of recent lab data from the hospital.  The CT scan was unremarkable.  She is given flu vaccine.  She is also advised to continue all medication, dietary regimen and exercise program.  Follow-up is recommended.  She is currently taking Paxil that has been keeping her anxiety under control as well as  depressive symptoms.  We would not change anything at this time.  Continue monitor her symptoms.  She is doing better.  She is feeling better.  Her liver panel has normalized now.  We reviewed the lab data given a copy of the lab work.  Follow-up recommended.  see orders and medication list.   SZenovia Jarred MD          Dictated using voice recognition software. Proofread, but unrecognized voice recognition errors may exist.

## 2014-07-21 NOTE — Progress Notes (Signed)
Are you allergic to eggs? NO    Have you ever had a serious reaction to any vaccine in the past? NO    Allergies on file:  Allergies   Allergen Reactions   ??? Codeine Nausea Only       Was the patient given a printed handout on the flu vaccine? YES

## 2014-08-10 MED ORDER — ERGOCALCIFEROL (VITAMIN D2) 50,000 UNIT CAP
1250 mcg (50,000 unit) | ORAL_CAPSULE | ORAL | Status: DC
Start: 2014-08-10 — End: 2015-02-05

## 2014-09-10 ENCOUNTER — Encounter: Payer: MEDICARE | Attending: Women's Health | Primary: Family Medicine

## 2014-09-14 MED ORDER — OXAZEPAM 10 MG CAP
10 mg | ORAL_CAPSULE | ORAL | Status: DC
Start: 2014-09-14 — End: 2014-10-22

## 2014-09-14 NOTE — Telephone Encounter (Signed)
Orders Placed This Encounter   ??? oxazepam (SERAX) 10 mg capsule     Sig: TAKE ONE CAPSULE BY MOUTH AT BEDTIME     Dispense:  30 Cap     Refill:  1     Per dr

## 2014-10-08 MED ORDER — L-METHYLFOLATE-VITAMIN B12-ACETYLCYSTEINE 5.6 MG-2 MG-600 MG TAB
600-2-6 mg | Freq: Every day | ORAL | Status: DC
Start: 2014-10-08 — End: 2014-10-22

## 2014-10-08 MED ORDER — MEMANTINE 28 MG SPRINKLE ER 24HR CAPSULE
28 mg | ORAL_CAPSULE | Freq: Every day | ORAL | Status: DC
Start: 2014-10-08 — End: 2014-10-22

## 2014-10-08 NOTE — Telephone Encounter (Signed)
From: Ioana  To: Zenovia Jarred, MD  Sent: 10/08/2014 11:10 AM EST  Subject:  Non-Urgent Medical Question    In  February 2015 we tried Gaylord Shih on Namenda and Cerefolin NAC for her moderate dementia. We took her off of them because she started vomiting and didn't try anything else. 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 Well, her memory is worse and I would like to try these meds or something (cheaper yet productive) again. I still have a Namenda starter pack (full) with expiration date of 12/2015 and about 16 days of Cerefolin NAC samples from before to get her started. Is it okay to start her on these today to see how it goes or do you have another med for [place] to try? Also, I have an appt with you on 10/22/14 also to further discuss this matter.  Thank  you for your help.  Newt Minion, [place]

## 2014-10-22 ENCOUNTER — Ambulatory Visit: Admit: 2014-10-22 | Discharge: 2014-10-22 | Payer: MEDICARE | Attending: Specialist | Primary: Family Medicine

## 2014-10-22 DIAGNOSIS — I10 Essential (primary) hypertension: Secondary | ICD-10-CM

## 2014-10-22 MED ORDER — CYANOCOBALAMIN 1,000 MCG/ML IJ SOLN
1000 mcg/mL | INTRAMUSCULAR | Status: DC
Start: 2014-10-22 — End: 2015-02-05

## 2014-10-22 MED ORDER — MEMANTINE ER 28 MG-DONEPEZIL 10 MG CAPSULE SPRINKLE,EXT.RELEASE 24 HR
28-10 mg | Freq: Every day | ORAL | Status: DC
Start: 2014-10-22 — End: 2014-10-26

## 2014-10-22 MED ORDER — OXAZEPAM 10 MG CAP
10 mg | ORAL_CAPSULE | Freq: Every evening | ORAL | Status: DC | PRN
Start: 2014-10-22 — End: 2015-01-28

## 2014-10-22 MED ORDER — ESTRADIOL 0.01% (0.1 MG/G) VAGINAL CREAM
0.01 % (0.1 mg/gram) | VAGINAL | Status: DC
Start: 2014-10-22 — End: 2014-12-31

## 2014-10-22 NOTE — Progress Notes (Signed)
[facility]  Rayen Dafoe C. Posey Pronto, M.D.  Campbell Riches, M.D.  [place], [place]  Ph No:  [phone]  Fax:  [phone]      CHIEF COMPLAINT: follow-up         HISTORY OF PRESENT ILLNESS: [name] is a 81 y.o. female with past medical history cardiomegaly, history of heart or hernia.  History of DJD, history of COPD, GERD, she seen office today accompanied by her daughter stating that she has had noted that recently her memory has been declining.  She would like to get started some sort of medication.  She had tried Aricept in the past, but that had made her too sleepy.  She would like to restart the therapy.  No fever, no chest pain, no nausea, no vomiting.  No diarrhea.  She is currently taking Namenda xr 28 mg daily.      HISTORY:  Allergies   Allergen Reactions   ??? Codeine Nausea Only     Past Medical History   Diagnosis Date   ??? Anemia    ??? Arthritis    ??? Cardiomegaly      d/t age;  asymptomatic   ??? HH (hiatus hernia)    ??? Osteoarthritis    ??? Gastritis    ??? Anemia, pernicious      since childhood   ??? Hiatal hernia    ??? Chronic obstructive pulmonary disease (HCC)      peer xray; asymptomatic; very mild/ no treatment   ??? GERD (gastroesophageal reflux disease)    ??? Thyroid disease      hypothyroid   ??? 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 Depression    ??? Dementia      with short term memory loss-needs assist with ADL's     Past Surgical History   Procedure Laterality Date   ??? Hx endoscopy     ??? Hx tonsillectomy     ??? Hx appendectomy     ??? Hx orthopaedic       right hip replacement   ??? Hx cholecystectomy       No family history on file.  History     Social History   ??? Marital Status: WIDOWED     Spouse Name: N/A     Number of Children: N/A   ??? Years of Education: N/A     Occupational History   ??? Not on file.     Social History Main Topics    ??? Smoking status: Never Smoker    ??? Smokeless tobacco: Not on file   ??? Alcohol Use: Yes      Comment: social   ??? Drug Use: No   ??? Sexual Activity: No     Other Topics Concern   ??? Not on file     Social History Narrative     Current Outpatient Prescriptions   Medication Sig Dispense Refill   ??? calcium-cholecalciferol, D3, (CALTRATE 600+D) tablet Take 1 Tab by mouth daily.     ??? oxazepam (SERAX) 10 mg capsule Take 1 Cap by mouth nightly as needed for Sleep or Anxiety. Max Daily Amount: 10 mg. 30 Cap 1   ??? cyanocobalamin (VITAMIN B12) 1,000 mcg/mL injection 1 mL by IntraMUSCular route every thirty (30) days. 3 Vial 5   ??? estradiol (ESTRACE) 0.01 % (0.1  mg/gram) vaginal cream 1 gram vaginally 3x/week 42.5 g 6   ??? memantine-donepezil (NAMZARIC) 28-10 mg CSpX Take 1 Cap by mouth daily. 30 Each 4   ??? ergocalciferol (ERGOCALCIFEROL) 50,000 unit capsule TAKE ONE CAPSULE BY MOUTH ONCE A WEEK (Patient taking differently: TAKE ONE CAPSULE BY MOUTH ONCE Bi weekly) 12 Cap 1   ??? docusate sodium (COLACE) 100 mg capsule Take 100 mg by mouth two (2) times a day.     ??? budesonide-formoterol (SYMBICORT) 160-4.5 mcg/actuation HFA inhaler Take 2 Puffs by inhalation two (2) times a day.     ??? pantoprazole (PROTONIX) 40 mg tablet Take 1 Tab by mouth daily. 90 Tab 4   ??? levothyroxine (SYNTHROID) 50 mcg tablet Take 1 Tab by mouth Daily (before breakfast). (Patient taking differently: Take 50 mcg by mouth Daily (before breakfast). Also take 1/2 extra tab every Wednesday) 90 Tab 3   ??? PARoxetine (PAXIL) 20 mg tablet Take 1 Tab by mouth daily. 90 Tab 3   ??? BD LUER-LOK SYRINGE 3 mL 25 x 1" syrg      ??? MULTIVITAMIN WITH MINERALS (ONE-A-DAY 50 PLUS PO) Take  by mouth. Last dose 10/10/13         MEDICATIONS:    Current outpatient prescriptions:   ???  calcium-cholecalciferol, D3, (CALTRATE 600+D) tablet, Take 1 Tab by mouth daily., Disp: , Rfl:   ???  oxazepam (SERAX) 10 mg capsule, Take 1 Cap by mouth nightly as needed  for Sleep or Anxiety. Max Daily Amount: 10 mg., Disp: 30 Cap, Rfl: 1  ???  cyanocobalamin (VITAMIN B12) 1,000 mcg/mL injection, 1 mL by IntraMUSCular route every thirty (30) days., Disp: 3 Vial, Rfl: 5  ???  estradiol (ESTRACE) 0.01 % (0.1 mg/gram) vaginal cream, 1 gram vaginally 3x/week, Disp: 42.5 g, Rfl: 6  ???  memantine-donepezil (NAMZARIC) 28-10 mg CSpX, Take 1 Cap by mouth daily., Disp: 30 Each, Rfl: 4  ???  ergocalciferol (ERGOCALCIFEROL) 50,000 unit capsule, TAKE ONE CAPSULE BY MOUTH ONCE A WEEK (Patient taking differently: TAKE ONE CAPSULE BY MOUTH ONCE Bi weekly), Disp: 12 Cap, Rfl: 1  ???  docusate sodium (COLACE) 100 mg capsule, Take 100 mg by mouth two (2) times a day., Disp: , Rfl:   ???  budesonide-formoterol (SYMBICORT) 160-4.5 mcg/actuation HFA inhaler, Take 2 Puffs by inhalation two (2) times a day., Disp: , Rfl:   ???  pantoprazole (PROTONIX) 40 mg tablet, Take 1 Tab by mouth daily., Disp: 90 Tab, Rfl: 4  ???  levothyroxine (SYNTHROID) 50 mcg tablet, Take 1 Tab by mouth Daily (before breakfast). (Patient taking differently: Take 50 mcg by mouth Daily (before breakfast). Also take 1/2 extra tab every Wednesday), Disp: 90 Tab, Rfl: 3  ???  PARoxetine (PAXIL) 20 mg tablet, Take 1 Tab by mouth daily., Disp: 90 Tab, Rfl: 3  ???  BD LUER-LOK SYRINGE 3 mL 25 x 1" syrg, , Disp: , Rfl:   ???  MULTIVITAMIN WITH MINERALS (ONE-A-DAY 50 PLUS PO), Take  by mouth. Last dose 10/10/13, Disp: , Rfl:       REVIEW OF SYSTEMS:  GENERAL/CONSTITUTIONAL: negative  HEAD, EYES, EARS, NOSE AND THROAT: negative  CARDIOVASCULAR: negative  RESPIRATORY:  negative   GASTROINTESTINAL: negative  GENITOURINARY: negative  MUSCULOSKELETAL: negative  BREASTS: negative  SKIN:  negative  NEUROLOGIC: negative  PSYCHIATRIC: negative  ENDOCRINE:  negative  HEMATOLOGIC/LYMPHATIC:  negative  ALLERGIC/IMMUNOLOGIC:  negative        PHYSICAL EXAM  Vital Signs - BP 140/82 mmHg  Pulse 72   Ht '5\' 7"'  (1.702 m)   Wt 154 lb (69.854 kg)   BMI 24.11 kg/m2    Constitutional - alert, well appearing, and in no distress.  Eyes - pupils equal and reactive, extraocular eye movements intact.  Ear, Nose, Mouth, Throat - normal  Examination of oropharynx: oral mucosa moist  Neck - supple, no significant adenopathy.   Respiratory - clear to auscultation, no wheezes, rales or bronchi, symmetric air entry.  Cardiovascular - normal rate, regular rhythm, normal S1, S2, systolic murmur     Gastrointestinal - Abdomen soft, nontender, nondistended, no masses.  Back exam - range of motion, limited  Musculoskeletal -   DJD changes noted.  Skin - normal coloration and turgor, no rashes, no suspicious skin lesions noted.  Neurological - Cranial nerves with notation of any deficits. Motor and sensory exam is intact   Extremities - peripheral pulses normal, no pedal edema, no clubbing or cyanosis  Psychiatric - awake, anxious at times, not oriented to place    LABS  Results for orders placed or performed during the hospital encounter of 07/16/14   CBC WITH AUTOMATED DIFF   Result Value Ref Range    WBC 3.9 (L) 4.3 - 11.1 K/uL    RBC 4.76 4.05 - 5.25 M/uL    HGB 13.6 11.7 - 15.4 g/dL    HCT 40.0 35.8 - 46.3 %    MCV 84.0 79.6 - 97.8 FL    MCH 28.6 26.1 - 32.9 PG    MCHC 34.0 31.4 - 35.0 g/dL    RDW 12.7 11.9 - 14.6 %    PLATELET 205 150 - 450 K/uL    MPV 9.5 (L) 10.8 - 14.1 FL    DF AUTOMATED      NEUTROPHILS 67 43 - 78 %    LYMPHOCYTES 24 13 - 44 %    MONOCYTES 8 4.0 - 12.0 %    EOSINOPHILS 1 0.5 - 7.8 %    BASOPHILS 0 0.0 - 2.0 %    IMMATURE GRANULOCYTES 0.3 0.0 - 5.0 %    ABS. NEUTROPHILS 2.6 1.7 - 8.2 K/UL    ABS. LYMPHOCYTES 0.9 0.5 - 4.6 K/UL    ABS. MONOCYTES 0.3 0.1 - 1.3 K/UL    ABS. EOSINOPHILS 0.1 0.0 - 0.8 K/UL    ABS. BASOPHILS 0.0 0.0 - 0.2 K/UL    ABS. IMM. GRANS. 0.0 0.0 - 0.5 K/UL   METABOLIC PANEL, COMPREHENSIVE   Result Value Ref Range    Sodium 141 136 - 145 mmol/L    Potassium 3.8 3.5 - 5.1 mmol/L    Chloride 111 (H) 98 - 107 mmol/L    CO2 23 21 - 32 mmol/L     Anion gap 7 7 - 16 mmol/L    Glucose 104 (H) 65 - 100 mg/dL    BUN 13 8 - 23 MG/DL    Creatinine 0.88 0.6 - 1.0 MG/DL    GFR est AA >60 >60 ml/min/1.26m    GFR est non-AA >60 >60 ml/min/1.739m   Calcium 8.3 8.3 - 10.4 MG/DL    Bilirubin, total 0.7 0.2 - 1.1 MG/DL    ALT 25 12 - 65 U/L    AST 29 15 - 37 U/L    Alk. phosphatase 115 50 - 136 U/L    Protein, total 6.7 6.3 - 8.2 g/dL    Albumin 3.4 3.2 - 4.6 g/dL    Globulin 3.3 2.3 - 3.5 g/dL  A-G Ratio 1.0 (L) 1.2 - 3.5     PROTHROMBIN TIME + INR   Result Value Ref Range    Prothrombin time 10.7 9.6 - 12.0 sec    INR 1.0 0.9 - 1.2     POC TROPONIN-I   Result Value Ref Range    Troponin-I (POC) 0 0.0 - 0.08 ng/ml             IMPRESSION:    ICD-10-CM ICD-9-CM    1. Benign essential HTN I10 401.1    2. Dementia, without behavioral disturbance F03.90 294.20    3. Depression F32.9 311    4. COPD (chronic obstructive pulmonary disease) ([place]) J44.9 496    5. Acquired hypothyroidism E03.9 244.9    6. Insomnia G47.00 780.52           PLAN:  I discussed with patient, reviewed the findings.  She does have dementia.  She is advised to try Aricept and Namenda combination.  She is given new tablets samples from the office.  She will continue current medication.  Continue diet.  Refill her prescription.  She is advised to cut down and stop her Celebrex.  She will take medication at bedtime.  She is advised to stop her Atarax.  Follow-up was recommended.  See orders.    Zenovia Jarred, MD          Dictated using voice recognition software. Proofread, but unrecognized voice recognition errors may exist.

## 2014-10-26 MED ORDER — L-METHYLFOLATE-VITAMIN B12-ACETYLCYSTEINE 5.6 MG-2 MG-600 MG TAB
600-2-6 mg | Freq: Every day | ORAL | Status: DC
Start: 2014-10-26 — End: 2015-02-05

## 2014-10-26 MED ORDER — MEMANTINE 28 MG SPRINKLE ER 24HR CAPSULE
28 mg | ORAL_CAPSULE | Freq: Every day | ORAL | Status: DC
Start: 2014-10-26 — End: 2014-11-24

## 2014-10-26 NOTE — Telephone Encounter (Signed)
From: Anicia  To: Zenovia Jarred, MD  Sent: 10/25/2014 5:05 PM EST  Subject:  Prescription Question    Jo Simmons was prescribed Namzaric for her dementia last Thursday. She has been showing physical and behavioral changes; such as vomiting, extreme anxiety, sleeplessness, lack of appetite, and constantly stating (more than usual) her confusion. I know this is to help the brain function, however, all it has done is made her more aware of her condition and more anxious. I took her off Namzaric yesterday and put her back on Cerefolin NAC and Namenda. Is there a lower dose of namenda to try? What is your recommendation?

## 2014-10-26 NOTE — Telephone Encounter (Signed)
From: Mashelle  To: Zenovia Jarred, MD  Sent: 10/26/2014 3:13 PM EST  Subject:  Prescription Question    Can  my Jo Simmons POA come by tomorrow to pickup samples of cerefolin NAC? She'll also need a written prescription for the cerefolin NAC. She'll return the namzaric samples when she comes. Thank you  -----  Message -----  From:  Norva Riffle  Sent:  10/26/2014 10:18 AM EST  To:  Jo Simmons  Subject:  RE: Prescription Question    Dr  Posey Pronto said to keep her on the Namenda and cerefolin and just to bring back samples on next visit. thanks    -----  Message -----    From: Jo Simmons    Sent: 10/25/2014 5:05 PM EST    To: Zenovia Jarred, MD  Subject:  Prescription Question    Jo Simmons was prescribed Namzaric for her dementia last Thursday. She has been showing physical and behavioral changes; such as vomiting, extreme anxiety, sleeplessness, lack of appetite, and constantly stating (more than usual) her confusion. I know this is to help the brain function, however, all it has done is made her more aware of her condition and more anxious. I took her off Namzaric yesterday and put her back on Cerefolin NAC and Namenda. Is there a lower dose of namenda to try? What is your recommendation?

## 2014-11-24 ENCOUNTER — Ambulatory Visit: Admit: 2014-11-24 | Discharge: 2014-11-24 | Payer: MEDICARE | Attending: Specialist | Primary: Family Medicine

## 2014-11-24 DIAGNOSIS — E039 Hypothyroidism, unspecified: Secondary | ICD-10-CM

## 2014-11-24 NOTE — Progress Notes (Signed)
[facility]  Yasmeen Manka C. Posey Pronto, M.D.  Campbell Riches, M.D.  [place], [place]  Ph No:  [phone]  Fax:  [phone]      CHIEF COMPLAINT: follow-up         HISTORY OF PRESENT ILLNESS: Jo Simmons is a 81 y.o. female with past medical history dementia, history of B12 deficiency, history of COPD, history of obstructive jaundice.  She is seen in office today come in by daughter stating that she is feeling better.  She is taking her medication regularly.  She had a reaction to Aricept as well as Namenda.  Since we took her off the medication.  She is better.  Her agitation is improved.  No nausea, no vomiting.  She is taking cerefolin 1   daily.      Allergies   Allergen Reactions   ??? Codeine Nausea Only       Past Medical History   Diagnosis Date   ??? Anemia    ??? Arthritis    ??? Cardiomegaly      d/t age;  asymptomatic   ??? HH (hiatus hernia)    ??? Osteoarthritis    ??? Gastritis    ??? Anemia, pernicious      since childhood   ??? Hiatal hernia    ??? Chronic obstructive pulmonary disease (HCC)      peer xray; asymptomatic; very mild/ no treatment   ??? GERD (gastroesophageal reflux disease)    ??? Thyroid disease      hypothyroid   ??? 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 Depression    ??? Dementia      with short term memory loss-needs assist with ADL's       Past Surgical History   Procedure Laterality Date   ??? Hx endoscopy     ??? Hx tonsillectomy     ??? Hx appendectomy     ??? Hx orthopaedic       right hip replacement   ??? Hx cholecystectomy         No family history on file.    History     Social History   ??? Marital Status: WIDOWED     Spouse Name: N/A   ??? Number of Children: N/A   ??? Years of Education: N/A     Occupational History   ??? Not on file.     Social History Main Topics   ??? Smoking status: Never Smoker    ??? Smokeless tobacco: Not on file   ??? Alcohol Use: Yes       Comment: social   ??? Drug Use: No   ??? Sexual Activity: No     Other Topics Concern   ??? Not on file     Social History Narrative           IMMUNIZATIONS:  Immunization History   Administered Date(s) Administered   ??? Influenza Vaccine 07/26/2012, 06/30/2013, 07/21/2014   ??? Pneumococcal Vaccine (Unspecified Type) 11/23/2012       MEDICATIONS:    Current outpatient prescriptions:   ???  L-Methylfolate-B12-Acetylcyst (CEREFOLIN NAC) tablet, Take 1 Tab by mouth daily., Disp: 90 Each, Rfl: 2  ???  calcium-cholecalciferol, D3, (CALTRATE 600+D) tablet, Take 1 Tab by mouth daily., Disp: , Rfl:   ???  oxazepam (SERAX) 10 mg capsule, Take 1  Cap by mouth nightly as needed for Sleep or Anxiety. Max Daily Amount: 10 mg., Disp: 30 Cap, Rfl: 1  ???  cyanocobalamin (VITAMIN B12) 1,000 mcg/mL injection, 1 mL by IntraMUSCular route every thirty (30) days., Disp: 3 Vial, Rfl: 5  ???  estradiol (ESTRACE) 0.01 % (0.1 mg/gram) vaginal cream, 1 gram vaginally 3x/week, Disp: 42.5 g, Rfl: 6  ???  ergocalciferol (ERGOCALCIFEROL) 50,000 unit capsule, TAKE ONE CAPSULE BY MOUTH ONCE A WEEK (Patient taking differently: TAKE ONE CAPSULE BY MOUTH ONCE Bi weekly), Disp: 12 Cap, Rfl: 1  ???  docusate sodium (COLACE) 100 mg capsule, Take 100 mg by mouth two (2) times a day., Disp: , Rfl:   ???  budesonide-formoterol (SYMBICORT) 160-4.5 mcg/actuation HFA inhaler, Take 2 Puffs by inhalation two (2) times a day., Disp: , Rfl:   ???  pantoprazole (PROTONIX) 40 mg tablet, Take 1 Tab by mouth daily., Disp: 90 Tab, Rfl: 4  ???  levothyroxine (SYNTHROID) 50 mcg tablet, Take 1 Tab by mouth Daily (before breakfast). (Patient taking differently: Take 50 mcg by mouth Daily (before breakfast). Also take 1/2 extra tab every Wednesday), Disp: 90 Tab, Rfl: 3  ???  PARoxetine (PAXIL) 20 mg tablet, Take 1 Tab by mouth daily., Disp: 90 Tab, Rfl: 3  ???  BD LUER-LOK SYRINGE 3 mL 25 x 1" syrg, , Disp: , Rfl:   ???  MULTIVITAMIN WITH MINERALS (ONE-A-DAY 50 PLUS PO), Take  by mouth. Last  dose 10/10/13, Disp: , Rfl:       REVIEW OF SYSTEMS:  GENERAL/CONSTITUTIONAL: negative  HEAD, EYES, EARS, NOSE AND THROAT: negative  CARDIOVASCULAR: negative  RESPIRATORY:  negative   GASTROINTESTINAL: negative  GENITOURINARY: negative  MUSCULOSKELETAL: negative  BREASTS: negative  SKIN:  negative  NEUROLOGIC: negative  PSYCHIATRIC: negative  ENDOCRINE:  negative  HEMATOLOGIC/LYMPHATIC:  negative  ALLERGIC/IMMUNOLOGIC:  negative        PHYSICAL EXAM  Vital Signs - BP 132/88 mmHg   Pulse 99   Ht '5\' 7"'  (1.702 m)   Wt 147 lb (66.679 kg)   BMI 23.02 kg/m2   Constitutional - alert, well appearing, and in no distress.  Eyes - pupils equal and reactive, extraocular eye movements intact.  Ear, Nose, Mouth, Throat - normal  Examination of oropharynx: oral mucosa moist  Neck - supple, no significant adenopathy.   Respiratory - clear to auscultation, no wheezes, rales or bronchi, symmetric air entry.  Cardiovascular - normal rate, regular rhythm, normal S1, S2, systolic     Gastrointestinal - Abdomen soft, nontender, nondistended, no masses.  Back exam - range of motion limited  Musculoskeletal -  DJD changes noted  Skin - normal coloration and turgor, no rashes, no suspicious skin lesions noted.  Neurological - Cranial nerves with notation of any deficits. Motor and sensory exam is intact   Extremities - peripheral pulses normal, no pedal edema, no clubbing or cyanosis  Psychiatric - aalert, awake, confused at times    LABS  Results for orders placed or performed during the hospital encounter of 07/16/14   CBC WITH AUTOMATED DIFF   Result Value Ref Range    WBC 3.9 (L) 4.3 - 11.1 K/uL    RBC 4.76 4.05 - 5.25 M/uL    HGB 13.6 11.7 - 15.4 g/dL    HCT 40.0 35.8 - 46.3 %    MCV 84.0 79.6 - 97.8 FL    MCH 28.6 26.1 - 32.9 PG    MCHC 34.0 31.4 - 35.0 g/dL  RDW 12.7 11.9 - 14.6 %    PLATELET 205 150 - 450 K/uL    MPV 9.5 (L) 10.8 - 14.1 FL    DF AUTOMATED      NEUTROPHILS 67 43 - 78 %    LYMPHOCYTES 24 13 - 44 %     MONOCYTES 8 4.0 - 12.0 %    EOSINOPHILS 1 0.5 - 7.8 %    BASOPHILS 0 0.0 - 2.0 %    IMMATURE GRANULOCYTES 0.3 0.0 - 5.0 %    ABS. NEUTROPHILS 2.6 1.7 - 8.2 K/UL    ABS. LYMPHOCYTES 0.9 0.5 - 4.6 K/UL    ABS. MONOCYTES 0.3 0.1 - 1.3 K/UL    ABS. EOSINOPHILS 0.1 0.0 - 0.8 K/UL    ABS. BASOPHILS 0.0 0.0 - 0.2 K/UL    ABS. IMM. GRANS. 0.0 0.0 - 0.5 K/UL   METABOLIC PANEL, COMPREHENSIVE   Result Value Ref Range    Sodium 141 136 - 145 mmol/L    Potassium 3.8 3.5 - 5.1 mmol/L    Chloride 111 (H) 98 - 107 mmol/L    CO2 23 21 - 32 mmol/L    Anion gap 7 7 - 16 mmol/L    Glucose 104 (H) 65 - 100 mg/dL    BUN 13 8 - 23 MG/DL    Creatinine 0.88 0.6 - 1.0 MG/DL    GFR est AA >60 >60 ml/min/1.34m    GFR est non-AA >60 >60 ml/min/1.737m   Calcium 8.3 8.3 - 10.4 MG/DL    Bilirubin, total 0.7 0.2 - 1.1 MG/DL    ALT 25 12 - 65 U/L    AST 29 15 - 37 U/L    Alk. phosphatase 115 50 - 136 U/L    Protein, total 6.7 6.3 - 8.2 g/dL    Albumin 3.4 3.2 - 4.6 g/dL    Globulin 3.3 2.3 - 3.5 g/dL    A-G Ratio 1.0 (L) 1.2 - 3.5     PROTHROMBIN TIME + INR   Result Value Ref Range    Prothrombin time 10.7 9.6 - 12.0 sec    INR 1.0 0.9 - 1.2     POC TROPONIN-I   Result Value Ref Range    Troponin-I (POC) 0 0.0 - 0.08 ng/ml             IMPRESSION:    ICD-10-CM ICD-9-CM    1. Acquired hypothyroidism E03.9 244.9    2. Chronic obstructive pulmonary disease, unspecified COPD type ([place]44.9 496    3. Gastroesophageal reflux disease without esophagitis K21.9 530.81    4. Alzheimer's dementia without behavioral disturbance, unspecified timing of dementia onset G30.9 331.0     F02.80 294.10    5. B12 deficiency E53.8 266.2           PLAN:  I discussed with patient and reviewed the findings.  Continue current medication at this time.  She is not able to tolerate any of the agent.  4.  Dementia.  I will avoid all the agent.  Continue supplement.  Continue diet.  Continue other medication.  Follow-up was recommended.  See orders.     SUZenovia JarredMD          Dictated using voice recognition software. Proofread, but unrecognized voice recognition errors may exist.

## 2014-12-31 ENCOUNTER — Ambulatory Visit: Admit: 2014-12-31 | Discharge: 2014-12-31 | Payer: MEDICARE | Attending: Women's Health | Primary: Family Medicine

## 2014-12-31 DIAGNOSIS — N368 Other specified disorders of urethra: Secondary | ICD-10-CM

## 2014-12-31 LAB — AMB POC URINALYSIS DIP STICK AUTO W/ MICRO (PGU)
Blood (UA POC): NEGATIVE
Glucose (UA POC): NEGATIVE mg/dL
Ketones (UA POC): NEGATIVE
Nitrites (UA POC): POSITIVE
Protein (UA POC): 30
Specific gravity (UA POC): 1.02 (ref 1.001–1.035)
Urobilinogen (POC): 1
pH (UA POC): 6 (ref 4.6–8.0)

## 2014-12-31 MED ORDER — CIPROFLOXACIN 250 MG TAB
250 mg | ORAL_TABLET | Freq: Two times a day (BID) | ORAL | Status: DC
Start: 2014-12-31 — End: 2015-02-05

## 2014-12-31 MED ORDER — ESTRADIOL 0.01% (0.1 MG/G) VAGINAL CREAM
0.01 % (0.1 mg/gram) | VAGINAL | Status: DC
Start: 2014-12-31 — End: 2015-02-05

## 2014-12-31 NOTE — Progress Notes (Signed)
[facility]  [address] [place] [address]  [phone]    Jo Simmons  DOB: [birth year]    Chief Complaint   Patient presents with   ??? Other     f/u for UTI or Hematuria          HPI     Jo Simmons is a 81 y.o. female who presents to office for yearly f/u.  Patient's daughter is present with her today as patient has some dementia issues.Patient is followed for urethral mucosal prolapse. Last seen on 11/2013. 03/2013 cysto by Dr Tamala Julian was normal other than urethral prolapse. Patient is using vaginal estrogen cream regularly 3x/week.  Patient denies any UTIs this past year however UA appears infected today. Denies any pain, dysuria, frequency, fever or gross hematuria.  Admits to urgency.     Patient had ERCP and stone extraction in 02/2014, but continued to feel poorly and have abdominal pain after the procedure. Taken for Lap chole 03/13/14.       Patient reports 2-3 UTIs in the past 65months. 07/01/13 urine culture grew >100,000 colonies and antibiotic Rxd by Dr Posey Pronto. With the 2 other UTIs, no UA was checked and antibiotics were just called in over the phone.     Patient initially presented to office on 02/10/13 for evaluation of urethral prolapse referred by Dr Laurann Montana (oncology). Patient saw a Dealer in [place] approximately 1 year ago for UTIs and urge incontinence. Was started on Estrace vaginal cream and Gelnique at that time however fell and has been in rehab for several months after Right THR. She had been off Estrace cream 3 months and noted to have urethral prolapse when she was catheterized in rehab. Patient was referred to Dr Laurann Montana and was told culture/biopsy was benign. 01/03/13 cytology was negative.  Patient had UTIs in rehab however no UTIs since returned home. ??  Patient denies any bladder or urethral pain. Denies any gross hematuria. Daytime frequency is q3hours and no nocturia. She admits to occasional urge incontinence which is not bothersome to her.      The patient's COPD is stable.The patient's hypothyroidism is stable           Past Medical History   Diagnosis Date   ??? Anemia    ??? Arthritis    ??? Cardiomegaly      d/t age;  asymptomatic   ??? HH (hiatus hernia)    ??? Osteoarthritis    ??? Gastritis    ??? Anemia, pernicious      since childhood   ??? Hiatal hernia    ??? Chronic obstructive pulmonary disease (HCC)      peer xray; asymptomatic; very mild/ no treatment   ??? GERD (gastroesophageal reflux disease)    ??? Thyroid disease      hypothyroid   ??? 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 Depression    ??? Dementia      with short term memory loss-needs assist with ADL's     Past Surgical History   Procedure Laterality Date   ??? Hx endoscopy     ??? Hx tonsillectomy     ??? Hx appendectomy     ??? Hx orthopaedic       right hip replacement   ??? Hx cholecystectomy       Current Outpatient Prescriptions   Medication Sig Dispense Refill   ???  ciprofloxacin HCl (CIPRO) 250 mg tablet Take 1 Tab by mouth two (2) times a day. 14 Tab 0   ??? estradiol (ESTRACE) 0.01 % (0.1 mg/gram) vaginal cream 1 gram vaginally 3x/week 42.5 g 6   ??? L-Methylfolate-B12-Acetylcyst (CEREFOLIN NAC) tablet Take 1 Tab by mouth daily. 90 Each 2   ??? calcium-cholecalciferol, D3, (CALTRATE 600+D) tablet Take 1 Tab by mouth daily.     ??? oxazepam (SERAX) 10 mg capsule Take 1 Cap by mouth nightly as needed for Sleep or Anxiety. Max Daily Amount: 10 mg. 30 Cap 1   ??? cyanocobalamin (VITAMIN B12) 1,000 mcg/mL injection 1 mL by IntraMUSCular route every thirty (30) days. 3 Vial 5   ??? ergocalciferol (ERGOCALCIFEROL) 50,000 unit capsule TAKE ONE CAPSULE BY MOUTH ONCE A WEEK (Patient taking differently: TAKE ONE CAPSULE BY MOUTH ONCE Bi weekly) 12 Cap 1   ??? docusate sodium (COLACE) 100 mg capsule Take 100 mg by mouth two (2) times a day.     ??? budesonide-formoterol (SYMBICORT) 160-4.5 mcg/actuation HFA inhaler Take  2 Puffs by inhalation two (2) times a day.     ??? pantoprazole (PROTONIX) 40 mg tablet Take 1 Tab by mouth daily. 90 Tab 4   ??? levothyroxine (SYNTHROID) 50 mcg tablet Take 1 Tab by mouth Daily (before breakfast). (Patient taking differently: Take 50 mcg by mouth Daily (before breakfast). Also take 1/2 extra tab every Wednesday) 90 Tab 3   ??? PARoxetine (PAXIL) 20 mg tablet Take 1 Tab by mouth daily. 90 Tab 3   ??? MULTIVITAMIN WITH MINERALS (ONE-A-DAY 50 PLUS PO) Take  by mouth. Last dose 10/10/13       Allergies   Allergen Reactions   ??? Codeine Nausea Only     History     Social History   ??? Marital Status: WIDOWED     Spouse Name: N/A   ??? Number of Children: N/A   ??? Years of Education: N/A     Occupational History   ??? Not on file.     Social History Main Topics   ??? Smoking status: Never Smoker    ??? Smokeless tobacco: Never Used   ??? Alcohol Use: Yes      Comment: social   ??? Drug Use: No   ??? Sexual Activity: No     Other Topics Concern   ??? Not on file     Social History Narrative     History reviewed. No pertinent family history.    Review of Systems  Constitutional:   Negative for fever and chills.  Genitourinary: Genitourinary negative  Musculoskeletal: Positive for arthralgias.      Urinalysis  UA - Dipstick  Results for orders placed or performed in visit on 12/31/14   AMB POC URINALYSIS DIP STICK AUTO W/ MICRO (PGU)     Status: None   Result Value Ref Range Status    Glucose (UA POC) Negative Negative mg/dL Final    Bilirubin (UA POC) Small Negative Final    Ketones (UA POC) Negative Negative Final    Specific gravity (UA POC) 1.020 1.001 - 1.035 Final    Blood (UA POC) Negative Negative Final    pH (UA POC) 6.0 4.6 - 8.0 Final    Protein (UA POC) 30  Negative Final    Urobilinogen (POC) 1 mg/dL  Final    Nitrites (UA POC) Positive Negative Final    Leukocyte esterase (UA POC) Small Negative Final       UA - Micro  WBC - 3-5  RBC - 0  Bacteria - 4+  Epith - 0    PHYSICAL EXAM    Filed Vitals:    12/31/14 0908    BP: 132/73   Pulse: 100   Temp: 98.4 ??F (36.9 ??C)   TempSrc: Tympanic   Height: 5\' 7"  (1.702 m)   Weight: 150 lb 9.6 oz (68.312 kg)       General appearance - alert, well appearing, and in no distress  Mental status - alert, oriented to person, place, and time  Eyes - Bilaterally normal & regular  Nose - normal and patent, no erythema, discharge   Neck - supple, no significant adenopathy  Chest/Lung-  Quiet, even and easy respiratory effort without use of accessory muscles  Abdomen - soft, nontender, nondistended, no masses or organomegaly  Back exam - full range of motion, no tenderness  Neurological - alert, oriented, normal speech, no focal findings or movement disorder noted on gross visual exam  Musculoskeletal - normal gait and station with walker  Extremities - normal full range of motion of all extremities  Skin - normal coloration and turgor, no rashes        PLAN  Continue Estrace vaginal cream 3x/week-Erxd  UA appears infected today  Sent urine culture  ERxd Cipro 250mg  po bid x 7 days  RTO in 1year          Assessment and Plan    ICD-10-CM ICD-9-CM    1. Prolapse urethral mucosa N36.8 599.5 AMB POC URINALYSIS DIP STICK AUTO W/ MICRO (PGU)      estradiol (ESTRACE) 0.01 % (0.1 mg/gram) vaginal cream   2. Acute cystitis without hematuria N30.00 595.0 URINE CULTURE,COMPREHENSIVE      ciprofloxacin HCl (CIPRO) 250 mg tablet         Lita Mains, WHNP-BC  Time Spent with patient:  15 Minutes (>50% of this time was spent on counseling this patient about UTI, prolapse mucosa)  Supervising Physician: Dr Queen Slough

## 2015-01-02 LAB — URINE CULTURE,COMPREHENSIVE

## 2015-01-15 NOTE — H&P (Signed)
Subjective/Chief Complaint Right hip pain after fall   History of Present Illness Patient was planning to move to [place] today and tripped over the cord of her vacuum cleaner.  Landed onto the right side.  Unable to bear weight. Severe pain.  No numbness or tingling.  Denies LOC.   Past History Short term memory loss   Past Med/Surgical Hx:  Osteoarthritis:   GERD - Esophageal Reflux:   Hypothyroidism:   Anemia:   Urinary Tract Infection:   left breast lumpectomy- beneign:   Carcinoids removed from stomach during endoscopy:   ALLERGIES:  No Known Allergies:   HOME MEDICATIONS: Medication Instructions Status  Vitamin D2 50,000 intl units (1.25 mg) oral capsule 1 cap(s) orally every 2 weeks on Wednesday Active  alendronate 70 mg oral tablet 1 tab(s) orally once a week on Wednesday Active  tramadol 50 mg oral tablet 1 tab(s) orally every 6 hours as needed for pain Active  Vitamin B-12 1000 mcg/mL injectable solution 1 milliliters (1022mg) subcutaneous once a month Active  pantoprazole 40 mg oral delayed release tablet 1 tab(s) orally once a day Active  levothyroxine 50 mcg (0.05 mg) oral tablet 1 tab(s) orally once a day Active  paroxetine 20 mg oral tablet 1 tab(s) orally once a day Active  multivitamin 1 tab(s) orally once a day Active  calcium carbonate 600 mg oral tablet 1 tab(s) orally once a day Active  donepezil 5 mg oral tablet 1 tab(s) orally once a day (at bedtime) Active  Senokot S 50 mg-8.6 mg oral tablet 2 tab(s) orally once a day (at bedtime), As Needed- for Constipation  Active   Family and Social History:  Family History Non-Contributory   Social History negative tobacco   Place of Living Home  Lives independently; uses cane on occasion.   Review of Systems:  Subjective/Chief Complaint Right hip pain.   Fever/Chills No   Cough No   Sputum No   Abdominal Pain No   Diarrhea No   Constipation No   Nausea/Vomiting No   SOB/DOE No   Chest  Pain No   Telemetry Reviewed NSR   Dysuria No   ROS Negative except as mentioned above   Medications/Allergies Reviewed Medications/Allergies reviewed   Physical Exam:  GEN well developed   HEENT pink conjunctivae, PERRL, hearing intact to voice, moist oral mucosa   NECK supple  No masses  trachea midline   RESP normal resp effort  clear BS  no use of accessory muscles   CARD regular rate  no murmur  No LE edema   ABD denies tenderness  denies Flank Tenderness   GU foley catheter in place  clear yellow urine draining   LYMPH negative neck   EXTR negative cyanosis/clubbing, negative edema, Right leg shortened and externally rotated. DP and PT 2+.  SPN, DPN, and tib nerve intact. No skin lesions about hip.   SKIN normal to palpation, No rashes, No ulcers   NEURO motor/sensory function intact   PSYCH alert, A+O to time, place, person, good insight, short term memory difficulty   Lab Results: Thyroid:  20-Feb-14 12:08   Thyroid Stimulating Hormone 3.08 (0.45-4.50 (International Unit)  ----------------------- Pregnant patients have  different reference  ranges for TSH:  - - - - - - - - - -  Pregnant, first trimetser:  0.36 - 2.50 uIU/mL)  Routine BB:  20-Feb-14 12:08   ABO Group + Rh Type B Positive  Antibody Screen NEGATIVE (Result(s) reported on 14 Nov 2012 at 02:39PM.)  Routine Chem:  20-Feb-14 12:08   Glucose, Serum  106  BUN 13  Creatinine (comp) 0.87  Sodium, Serum 144  Potassium, Serum 3.8  Chloride, Serum  112  CO2, Serum 21  Calcium (Total), Serum  8.1  Anion Gap 11  Osmolality (calc) 287  eGFR (African American) >60  eGFR (Non-African American) >60 (eGFR values <35m/min/1.73 m2 may be an indication of chronic kidney disease (CKD). Calculated eGFR is useful in patients with stable renal function. The eGFR calculation will not be reliable in acutely ill patients when serum creatinine is changing rapidly. It is not useful in  patients on  dialysis. The eGFR calculation may not be applicable to patients at the low and high extremes of body sizes, pregnant women, and vegetarians.)  Cardiac:  20-Feb-14 12:08   CK, Total 60  CPK-MB, Serum  < 0.5 (Result(s) reported on 14 Nov 2012 at 02:40PM.)  Troponin I < 0.02 (0.00-0.05 0.05 ng/mL or less: NEGATIVE  Repeat testing in 3-6 hrs  if clinically indicated. >0.05 ng/mL: POTENTIAL  MYOCARDIAL INJURY. Repeat  testing in 3-6 hrs if  clinically indicated. NOTE: An increase or decrease  of 30% or more on serial  testing suggests a  clinically important change)  Routine Coag:  20-Feb-14 12:08   Prothrombin 13.1  INR 1.0 (INR reference interval applies to patients on anticoagulant therapy. A single INR therapeutic range for coumarins is not optimal for all indications; however, the suggested range for most indications is 2.0 - 3.0. Exceptions to the INR Reference Range may include: Prosthetic heart valves, acute myocardial infarction, prevention of myocardial infarction, and combinations of aspirin and anticoagulant. The need for a higher or lower target INR must be assessed individually. Reference: The Pharmacology and Management of the Vitamin K  antagonists: the seventh ACCP Conference on Antithrombotic and Thrombolytic Therapy. CTDVVO.1607Sept:126 (3suppl): [id number] A HCT value >55% may artifactually increase the PT.  In one study,  the increase was an average of 25%. Reference:  "Effect on Routine and Special Coagulation Testing Values of Citrate Anticoagulant Adjustment in Patients with High HCT Values." American Journal of Clinical Pathology 2006;126:400-405.)  Routine Hem:  20-Feb-14 12:08   WBC (CBC) 8.8  WBC (CBC) -  RBC (CBC) 4.62  RBC (CBC) -  Hemoglobin (CBC) 13.9  Hemoglobin (CBC) -  Hematocrit (CBC) 39.8  Hematocrit (CBC) -  Platelet Count (CBC) 223  Platelet Count (CBC) - (Result(s) reported on 14 Nov 2012 at 12:39PM.)  MCV 86  MCV -  MCH 30.0   MCH -  MCHC 34.8  MCHC -  RDW 13.4  RDW -  Neutrophil % 83.7  Neutrophil % -  Lymphocyte % 9.2  Lymphocyte % -  Monocyte % 6.4  Monocyte % -  Eosinophil % 0.5  Eosinophil % -  Basophil % 0.2  Basophil % -  Neutrophil #  7.4  Neutrophil # -  Lymphocyte #  0.8  Lymphocyte # -  Monocyte # 0.6  Monocyte # -  Eosinophil # 0.0  Eosinophil # -  Basophil # 0.0 (Result(s) reported on 14 Nov 2012 at 12:53PM.)  Basophil # - (Result(s) reported on 14 Nov 2012 at 12:45PM.)  Bands -  Segmented Neutrophils -  Lymphocytes -  Variant Lymphocytes -  Monocytes -  Eosinophil -  Basophil -  Metamyelocyte -  Myelocyte -  Promyelocyte -  Blast-Like -  Other Cells -  NRBC -  Diff Comment 1 -  Diff  Comment 2 -  Diff Comment 3 -  Diff Comment 4 -  Diff Comment 5 -  Diff Comment 6 -  Diff Comment 7 -  Diff Comment 8 -  Diff Comment 9 -  Diff Comment 10 - (Result(s) reported on 14 Nov 2012 at 12:45PM.)   Radiology Results: XRay:    20-Feb-14 12:53, Femur Right  Femur Right  REASON FOR EXAM:    femur  COMMENTS:       PROCEDURE: DXR - DXR FEMUR RIGHT  - Nov 14 2012 12:53PM     RESULT: The patient has sustained a subcapital fracture of the right hip.   The intertrochanteric region and the femoral shaft appear normal. There  are degenerative changes associated with the knee.    IMPRESSION:  The patient has sustained an acute subcapital fracture of   the right hip with minimal displacement.     Dictation Site: 1      Verified By: DAVID A. Martinique, M.D., MD    20-Feb-14 12:53, Hip Right Complete  Hip Right Complete  REASON FOR EXAM:    fall  COMMENTS:       PROCEDURE: DXR - DXR HIP RIGHT COMPLETE  - Nov 14 2012 12:53PM     RESULT: AP and lateral views of the right hip reveal the patient to   sustained a subcapital fracture. The joint space itself exhibits mild   degenerative change. The observed portions of the right hemipelvis appear   normal.    IMPRESSION:  The  patient has sustained an acute subcapital fracture of   the right hip.     Dictation Site: 1    Verified By: DAVID A. Martinique, M.D., MD    20-Feb-14 12:53, Pelvis AP Only  Pelvis AP Only  REASON FOR EXAM:    fall  COMMENTS:       PROCEDURE: DXR - DXR PELVIS AP ONLY  - Nov 14 2012 12:53PM     RESULT: The bony pelvis is osteopenic. The patient has sustained an acute   subcapital fracture of the right hip. The right hemipelvis appears intact   as does the remainder the pelvis.    IMPRESSION:  There is no evidence of an acute pelvic fracture this   patient who has sustained an acute subcapital fracture of the right hip.     Dictation Site: 1      Verified By: DAVID A. Martinique, M.D., MD    20-Feb-14 13:00, Chest 1 View AP or PA  Chest 1 View AP or PA  REASON FOR EXAM:    fracture hip  COMMENTS:       PROCEDURE: DXR - DXR CHEST 1 VIEWAP OR PA  - Nov 14 2012  1:00PM     RESULT: Comparison is made to the study of February 28, 2012.    The lungs are hyperinflated. The cardiac silhouette is top normal in   size. The pulmonary vascularity is not engorged. There is no pleural   effusion or pneumothorax. The observed portions of the bony thorax appear   intact.    IMPRESSION:  The findings are consistent with COPD. There is no evidence   of CHF or pneumonia or atelectasis.   Dictation Site: 1        Verified By: DAVID A. Martinique, M.D., MD    Assessment/Admission Diagnosis Right hip subcapital femur fracture.   Plan Hemiarthroplasty of the right hip planned.  Risks and benefits discussed with patient and her daughter, Jenny Reichmann.  Pending medical clearance, srugery scheduled for 11:30am tomorrow.  NPO.  Bedrest.   Electronic Signatures: Dawayne Patricia (MD)  (Signed [phone] 16:29)  Authored: CHIEF COMPLAINT and HISTORY, PAST MEDICAL/SURGIAL HISTORY, ALLERGIES, HOME MEDICATIONS, FAMILY AND SOCIAL HISTORY, REVIEW OF SYSTEMS, PHYSICAL EXAM, LABS, Radiology, ASSESSMENT AND PLAN   Last  Updated: 20-Feb-14 16:29 by Dawayne Patricia (MD)

## 2015-01-15 NOTE — Op Note (Signed)
DATE OF BIRTH:  May 10, 1928  DATE OF PROCEDURE:  11/15/2012  PREOPERATIVE DIAGNOSIS:  Right hip femoral neck fracture, displaced.   POSTOPERATIVE DIAGNOSIS:  Right hip femoral neck fracture, displaced.   PROCEDURE PERFORMED:  Hemiarthroplasty, right hip.   SURGEON:  Murlean HarkShalini Theta Leaf, MD   ASSISTANT:  None.   ANESTHESIA:  Spinal anesthesia with sedation.   ESTIMATED BLOOD LOSS:  1 liter.  FLUID REPLENISHED:  One unit of packed red blood cells.  FINDINGS:  The patient was found to have a displaced femoral neck fracture, with a severely comminuted neck. The patient sustained significant blood loss from within the femoral canal, which stopped immediately on filling of the canal with a femoral broach.   COMPLICATIONS:  No immediate intraoperative or postoperative complications were noted.   DISPOSITION:  The patient will be weight-bearing as tolerated. She will be instructed on hip precautions. She will be given 24 hours of prophylactic antibiotics. She will be given Lovenox for DVT prophylaxis.   INDICATIONS FOR PROCEDURE: Miss Mandy [name] is an 81 year old female who presented to the Emergency Room after sustaining a fall. She was found to have a displaced femoral neck fracture on the right side. With the patient's daughter present, the risks and benefits of hemiarthroplasty were explained, and informed consent was obtained. Right lower extremity was marked as the operative site.     DESCRIPTION OF PROCEDURE:  Miss Mandy [name] was identified in the preoperative holding area. The right lower extremity was identified as the operative site, and site marking was confirmed. The patient was brought into the Operating Room, and spinal anesthesia was administered. The patient was placed on the Operating Room table in a lateral decubitus position with the right side up. She was secured on the table with appropriate padding of all prominences and an axillary roll placed. The right lower extremity was  prepared and draped in the usual sterile fashion.   A surgical timeout was performed, identifying the patient, procedure, operative site, confirming site marking, confirming operative consent, confirming images and confirming administration of preoperative antibiotics.   The landmarks were palpated, and a standard posterolateral incision was made. Sharp dissection was carried down through skin and subcutaneous tissue. The tensor fascia lata was identified in split using [place]. The fascia was split sharply distally. The gluteus was bluntly split proximally. The hip was then internally rotated, and a deep retractor was placed. There was extensive bursal tissue, which was removed. The short external rotators were identified and transected. The piriformis was tagged. The capsule was significantly torn. The salvageable edges were tagged. At this time, the femoral neck fracture was exposed. The femoral neck was found to be quite comminuted. The femoral neck fracture was revised to a flesh edge. The femoral head was removed using a corkscrew. The acetabulum was copiously irrigated for fragments of comminuted fracture bone. It was palpated, and no remaining fragments were noted.   At this time, attention was turned to the femoral canal preparation. A box cutter was used to start. A canal finder was used, followed by a lateralizer. Care was taken to significantly lateralize the canal. Sequential broaching was carried to a size 4. Multiple head and neck sizes were tried and reduced. A size 4 stem with a 28 mm inner diameter and 47 mm outer diameter, with an 8.5 12/14 taper was found to have the best fit, with excellent stability through a range of motion. All trial components were then removed. The surgical site was copiously irrigated. The cement restrictor  was sized and implanted. The canal was packed with Neo-Synephrine-soaked vaginal packing.   At this time, the packing was removed and the canal was cemented in a  pressurized fashion. A size 4 Summit femoral stem 12/14 taper basic cemented was inserted and held in appropriate anteversion. Excess cement was removed. Implant was held in position until cement was dry. The acetabulum was checked for loose cement. Copious irrigation was again undertaken.   At this time, trial components for the head were again applied. The same components were found to be adequate. A 28 mm inner diameter/47 mm outer diameter self-centering bipolar head was applied over an Articul-eze femoral head of 28 mm plus 8.5 12/14 taper. Components were secured, and hip was reduced. Hip was found to be quite stable.   The hip was copiously irrigated. All retractors were removed. The hip was externally rotated and the capsule was repaired. The  piriformis was then subsequently repaired to the posterior aspect of the greater trochanter. At this time, the fascia was closed using one Vicryl suture. Prior to fascial closure, a drain was placed. The subcutaneous tissue was closed using a 2-0 Vicryl suture, and skin was closed using staples. Sterile dressings were applied. An abduction pillow was placed under the patient's leg, and the patient was then placed in a supine position.   Hemoglobin will be checked in the postoperative care unit. The patient will be weight-bearing as tolerated. Physical therapy will  begin the day after surgery.     ____________________________ Murlean Hark, MD sr:mr D: 11/18/2012 16:13:18 ET T: 11/18/2012 20:31:52 ET JOB#: [id number]  cc: Murlean Hark, MD, <Dictator> Murlean Hark MD ELECTRONICALLY SIGNED 12/03/2012 11:46

## 2015-01-15 NOTE — Discharge Summary (Signed)
PATIENT NAME:  Mandy [name], Mandy [name] MR#:  [id number] DATE OF BIRTH:  Jul 25, 1928  DATE OF ADMISSION:  11/14/2012 DATE OF DISCHARGE:  11/19/2012  OPERATION: On 11/15/2012, she had a right hip hemiarthroplasty.   SURGEON:   Murlean Hark, MD  ANESTHESIA: Spinal.   ESTIMATED BLOOD LOSS: 1000 mL.   DRAINS: Hemovac.  IMPLANTS:  DePuy size 4 Summit femoral stem 12/14 taper basic, bone cement, 28 mm inner diameter/ 47 mm outer diameter self-centering bipolar head, femoral head 28 mm+, 8.5  12/14 taper.   COMPLICATIONS: None.   HISTORY:  The patient is an 81 year old female who presented to the ED after sustaining a fall. She was found to have displaced femoral neck fracture on the right.   PHYSICAL EXAMINATION: GENERAL: Well-developed.  HEENT: Pink conjunctiva, PERRL, hearing intact to voice, most moist oral mucosa.  NECK: Supple, no masses. Trachea midline.  RESPIRATORY: Normal respiratory effort, clear breath sounds. No use of accessory muscles.  CARDIOVASCULAR: No murmur. No lower extremity edema.  ABDOMEN: Denies tenderness. Denies flank tenderness.  GENITOURINARY: Foley catheter in place. Appeared clear urine draining.  EXTREMITIES: Negative cyanosis/clubbing, negative edema. Right leg shortened and actually rotated. DP and PT pulses +2. SPN, DPN, tibial nerve intact. No skin lesions about the hip.  SKIN: Normal to palpation, no rashes or ulcers.  NEUROLOGIC: Motor and sensory function intact.  PSYCHIATRIC: Alert and oriented to time, place and person. Good insight. Short-term memory is difficult.   HOSPITAL COURSE: After admission on 11/14/2012, the patient underwent right hip hemiarthroplasty on 11/15/2012. On postoperative day 1, on 11/16/2012, she had a hemoglobin of 9.9. Physical therapy was begun. She is on Lovenox for anticoagulation. On postoperative day two, 11/17/2012, hemoglobin was 7.6, and she was hypotensive. The decision was made to transfuse 1 unit of packed red blood  cells. Her dressing was changed this day. On postoperative day three, 11/18/2012, her hemoglobin was improved to 8.7. T-max was 100.2. She continued to have physical therapy. On postoperative day four, 11/19/2012, she was stable and ready to go to rehab.   CONDITION AT DISCHARGE: Stable.   DISPOSITION: The patient was sent to rehab.   DISCHARGE INSTRUCTIONS: The patient will follow up with [facility] orthopedics in 2 weeks for staple removal. The patient will do physical therapy and will be weight bearing as tolerated on the right lower extremity. She may be on a regular diet. TED hose knee-high bilaterally. Abduction pillow will used when in bed and in the chair. Dressing can be changed once daily on an as-needed basis.   DISCHARGE MEDICATIONS: 1.  Acetaminophen 325, 1 to 2 tabs p.o. q.4 hours p.r.n. temperature greater than 100.5.  2.  Acetaminophen/hydrocodone 325/5, 1 to 2 tabs p.o. q.4-6 hours p.r.n. pain.  3.  Bisacodyl 2 mg rectal at bedtime p.r.n. constipation.  4.  Calcium carbonate 500 mg/vitamin D 200 international units 1 tab p.o. b.i.d. with meals.  5.  Calcium carbonate chew 500 mg 1 tab p.o. b.i.d.  6.  Ciprofloxacin 500 mg 1 tab p.o. q.12 hours. 7.  Docusate 240 p.o. at bedtime.  8.  Donepezil (Aricept) 5 mg 1 tab p.o. at bedtime.  9.  Enoxaparin 40 mg subcutaneous one injection, one injection daily x 14 days.  10.  Ferrous sulfate 325 mg 1 tab p.o. p.o. b.i.d. with meals.  11.  Levothyroxine 0.05 mg q. 6:00 a.m.  12. Magnesium hydroxide suspension milk of magnesia 30 mL p.o. at bedtime p.r.n. constipation.  13.  Multivitamin 1 tab  p.o. daily.  14.  Pantoprazole 40 mg p.o. each 6:00 a.m.  15.  Paroxetine 20 mg p.o. once daily.  16/  Albuterol/ipratropium SVN 3 mL SVN q.4 hours p.r.n. shortness of breath.      ____________________________ Nakiah Osgood M. Haskel KhanBerndt, NP amb:ct D: 11/19/2012 11:15:00 ET T: 11/19/2012 11:32:24 ET JOB#: [id number]  cc: Murlean HarkShalini Ramasunder,  MD Janica Eldred M. Haskel KhanBerndt, NP, <Dictator>  Burt EkAPRIL M Kaedence Connelly FNP ELECTRONICALLY SIGNED 11/27/2012 14:32

## 2015-01-15 NOTE — Consult Note (Signed)
PATIENT NAME:  Mandy [name], Mandy [name] MR#:  [id number] DATE OF BIRTH:  04/09/1928  DATE OF CONSULTATION:  11/14/2012  REFERRING PHYSICIAN:  Rockne Menghini, MD CONSULTING PHYSICIAN:  Shaune Pollack, MD  PRIMARY CARE PHYSICIAN:  Stann Mainland. Sampson Goon, MD  REASON FOR CONSULTATION:  Preop clearance.   HISTORY OF PRESENT ILLNESS:  The patient is an 81 year old Caucasian female with of osteoarthritis, GERD, hypothyroidism, anemia, was sent to ED due to fall and hip injury.  The patient was diagnosed with right hip fracture.  The surgeon requested medical consult for preop clearance.   The patient is alert, awake, oriented, in no acute distress. According to the patient and the patient's daughter, the patient is moving out of apartment and she was cleaning the apartment and fell by accident. The patient denies any headache or dizziness, loss of consciousness, syncope or seizure. No incontinence. The patient denies any fever or chills, weight loss, weakness. No chest pain, palpitation, orthopnea or nocturnal dyspnea. The patient denies any other injuries.   PAST MEDICAL HISTORY: 1.  Osteoarthritis.  2.  GERD. 3.  Hypothyroidism.  4.  Pernicious anemia.  5.  Dementia.  6.  Carcinoid. 7.  History of UTI.   PAST SURGICAL HISTORY: Carcinoid tumor resection, appendectomy.   FAMILY HISTORY: Mother lived more than 52 years old. Father died early due to alcohol abuse.   SOCIAL HISTORY: No smoking or drinking or illicit drugs.   REVIEW OF SYSTEMS:  CONSTITUTIONAL: The patient denies any fever or chills. No headache or dizziness. No weakness.  EYES: No double vision, blurry vision.  ENT: No postnasal drip, slurred speech or dysphagia.  SKIN: No rash or jaundice.  CARDIOVASCULAR: No chest pain, palpitation, orthopnea or nocturnal dyspnea. No leg edema.  PULMONARY: No cough, sputum, shortness of breath or hemoptysis.  GASTROINTESTINAL: No abdominal pain, nausea, vomiting or diarrhea. No melena or bloody  stool.  GENITOURINARY: No dysuria, hematuria or incontinence.  ENDOCRINE: No polyuria, polydipsia, heat or cold intolerance.  HEMATOLOGY: No easy bruising or bleeding.  NEUROLOGY: No syncope, loss of consciousness or seizure.   PHYSICAL EXAMINATION: VITAL SIGNS:  Blood pressure 161/88, pulse 67, O2 saturation 94% on room air.  GENERAL: The patient is alert, awake, oriented, in no acute distress.  HEENT: Pupils round, equal, reactive to light and accommodation.  NECK: Supple. No JVD or carotid bruits. No lymphadenopathy. No thyromegaly.  CARDIOVASCULAR: S1, S2 regular rate and rhythm. No murmurs, gallops.  PULMONARY: Bilateral air entry. No wheezing or rales. No use of accessory muscles to breathe.  ABDOMEN: Soft. No distention. No tenderness. No organomegaly. Bowel sounds present.  EXTREMITIES: No edema, clubbing or cyanosis. No calf tenderness. Strong bilateral pedal pulses. Right side hip tenderness. Unable to move right lower extremity and minimal movement of the left lower extremity due to pain.  SKIN: No rash or jaundice.  NEUROLOGY: A and O x 3. No focal deficit.   LABORATORY, DIAGNOSTIC, AND RADIOLOGICAL DATA: Chest x-ray showed findings are consistent with chronic obstructive pulmonary disease.   Right hip x-ray showed right hip fracture with acute subcapital fracture of right hip.   Pelvis x-ray showed no fracture.   CBC normal. INR 1.0, PT 13.1, CK-MB less than 0.5, CK 60, troponin less than 0.02, glucose 106, BUN 13, creatinine 0.87. Electrolytes normal.   EKG showed normal sinus rhythm at 62 beats per minute.   IMPRESSION: 1.  Right hip fracture.  2.  Osteoarthritis.  3.  Gastroesophageal reflux disease.  4.  Hypothyroidism.  5.  Dementia.   RECOMMENDATIONS:  1. The patient has a low to moderate risk for hip surgery. The patient can get hip surgery either today or tomorrow, depending on orthopedic surgeon's plan.  2.  The patient will need deep vein thrombosis  prophylaxis after surgery.  3.  We will continue the patient's home medication.  4. Gastrointestinal prophylaxis. I discussed the patient's condition and recommendations with the patient's daughter and the patient.   TIME SPENT: About 60 minutes.  ALLERGIES: None.    MEDICATIONS: 1.  Vitamin D2 50,000 international units 1 cap every 2 weeks.  2.  Vitamin B12 1000 mcg subcutaneous once a month.  3.  Tramadol 50 mg every 6 hours p.r.n.  4.  Senokot 50 mg/8.6 mg 2 tablets once a day at bedtime and p.r.n. for constipation.  5.  Paroxetine 20 mg p.o. once a day.  6.  Pantoprazole 40 mg p.o. daily.  7.  Multivitamin 1 tablet p.o. daily.  8.  Levothyroxine 50 mcg p.o. daily.  9.  Donepezil 5 mg p.o. once a day at bedtime.  10.  Calcium carbonate 600 mg p.o. tablets once a day.  11.  Alendronate 70 mg p.o. once a week.      ____________________________ Shaune PollackQing Angel Hobdy, MD qc:cc D: 11/14/2012 16:02:15 ET T: 11/14/2012 19:21:36 ET JOB#: [id number]  cc: Shaune PollackQing Taylyn Brame, MD, <Dictator> Shaune PollackQING Tudor Chandley MD ELECTRONICALLY SIGNED 11/15/2012 14:28

## 2015-01-17 NOTE — Consult Note (Signed)
Brief Consult Note: Diagnosis: Tachycardia, ? SVT, no ducumentation.   Patient was seen by consultant.   Consult note dictated.   Comments: REC  Agree with metoprolol, review echo, may dc home for further out-patient eval and management.  Electronic Signatures: Marcina MillardParaschos, Jonia Oakey (MD)  (Signed 06-Jun-13 09:33)  Authored: Brief Consult Note   Last Updated: 06-Jun-13 09:33 by Marcina MillardParaschos, [place] (MD)

## 2015-01-17 NOTE — Consult Note (Signed)
PATIENT NAME:  Mandy [name], Mandy [name] MR#:  [id number] DATE OF BIRTH:  [birth year]  DATE OF CONSULTATION:  02/29/2012  REFERRING PHYSICIAN:  Dr. Allena KatzPatel CONSULTING PHYSICIAN:  Marcina MillardAlexander Ilda Laskin, MD PRIMARY CARE PHYSICIAN: Jimmie MollyDon C. Candelaria Stagershaplin, MD    HISTORY OF PRESENT ILLNESS: The patient is an 81 year old female referred for evaluation of tachycardia. The patient reports she has had [name] history of palpitations and tachycardia. On 02/28/2012, the patient was at home when she experienced tachycardia. Her daughter checked her pulse and thought it was in the 150s. [facility], but by the time she arrived her rate was back in the 60s and 70s. The patient was admitted to telemetry where she has remained in sinus rhythm. There was one episode where she felt she was told that she was tachycardic, however, there were no recordings or strips to view.  The patient currently is in sinus rhythm, and she denies chest pain or shortness of breath.   PAST MEDICAL HISTORY:  1. Gastroesophageal reflux disease.  2. Hypothyroidism.  3. Anemia. 4. Status post removal of carcinoid tumor four years ago.   MEDICATIONS:  1. Vitamin D 50,000 international units once every 2 weeks.  2. Vitamin B12 injections monthly.  3. Tramadol 50 mg every 6 hours p.r.n.  4. Synthroid 125 mcg daily.  5. Paxil 10 mg daily.  6. Multi Vite 1 daily.  7. Caltrate 1/2 tab daily.  8. Calcium 1 tab daily.  9. Alendronate weekly.   SOCIAL HISTORY: The patient is married and resides with her husband. [place] in [place]. She denies tobacco abuse.   FAMILY HISTORY: No immediate family history of coronary artery disease or myocardial infarction.   REVIEW OF SYSTEMS: CONSTITUTIONAL: No fever or chills. EYES: No blurry vision. EARS: No hearing loss. RESPIRATORY: No shortness of breath. CARDIOVASCULAR: No chest pain. GASTROINTESTINAL: No nausea, vomiting, or diarrhea. GU: No dysuria or hematuria.  ENDOCRINE: No polyuria or polydipsia. HEMATOLOGICAL: No easy bruising or bleeding. MUSCULOSKELETAL: No arthralgias or myalgias. NEUROLOGICAL: No focal muscle weakness or numbness. PSYCHOLOGICAL: No depression or anxiety.   PHYSICAL EXAMINATION:  VITAL SIGNS: Blood pressure 118/67, pulse 70, respirations 18, temperature 98.1, and pulse oximetry 96%.   HEENT: Pupils are equal, reactive to light and accommodation.   NECK: Supple without thyromegaly.   LUNGS: Clear.   CARDIOVASCULAR: Normal jugular venous pressure. Normal point of maximal impulse. Regular rate and rhythm. Normal S1, S2. No appreciable gallop, murmur, or rub.   ABDOMEN: Soft and nontender. Pulses were intact bilaterally.   MUSCULOSKELETAL: Normal muscle tone.   NEUROLOGICAL: The patient is alert and oriented x3. Motor and sensory are both grossly intact.   IMPRESSION: An 81 year old female with history of tachycardia and palpitations who presents after an episode of tachycardia with apparently [name] heart rate in the 150s. However, there is no documented telemetry strip or EKG. The patient currently is in sinus rhythm. This may represent some form of atrial tachyarrhythmia, but without documentation at this time no specific recommendations can be made. I would agree low-dose metoprolol succinate 25 mg daily may be of benefit.   RECOMMENDATIONS:  1. Discontinue metoprolol  tartrate. 2. Start metoprolol succinate 25 mg daily.  3. Review 2-D echocardiogram.   4. If the patient is clinically stable, may discharge home and continue work-up as an outpatient.  ____________________________ Marcina MillardAlexander Antwain Caliendo, MD ap:cbb D: 02/29/2012 09:31:56 ET T: 02/29/2012 10:02:35 ET JOB#: [id number]  cc: Marcina MillardAlexander Magaret Justo, MD, <Dictator> [name]  MD ELECTRONICALLY SIGNED 03/07/2012 17:41

## 2015-01-17 NOTE — H&P (Signed)
PATIENT NAME:  Mandy [name], Mandy [name] MR#:  [id number] DATE OF BIRTH:  [birth year]  DATE OF ADMISSION:  02/28/2012  PRIMARY CARE PHYSICIAN: Dr. Conchita Paris  ED REFERRING PHYSICIAN: Dr. Olivia Mackie  CHIEF COMPLAINT: Palpitation.   HISTORY OF PRESENT ILLNESS: Patient is an 81 year old white female with history of hypothyroidism, osteoarthritis, gastroesophageal reflux disease, anemia who reports that she has been having palpitation for about a year now. She initially thought that these were related to her husband passing away and may be related to anxiety so didn't think too much of it. However, reports that the frequency of palpitations increased. Today she was at home just sitting at home when all of a sudden she noticed that her heart was beating fast and her daughter was there so she checked her pulse and it was in the 150s. Then patient came to the ED and when evaluated in the waiting area heart rate again was in the 130s. By the time they brought her back to the ER the heart rate normalized into the 60s-70s. Patient reports that there is no relationship of activity to her symptoms. They can occur at rest, can occur with activity. She also gets short of breath when she has these. Today she felt like she was going to faint. She has not had any left-sided chest pain. She does get some shortness of breath when she has these episodes. She has not had any fevers, chills. Denies any cough. No dyspnea on exertion. She does have feeling of near syncope when she has these but has not passed out. Denies any abdominal pain, nausea, vomiting, or diarrhea. Denies any urinary symptoms.   PAST MEDICAL HISTORY:  1. Osteoarthritis.  2. Gastroesophageal reflux disease.  3. History of hypothyroidism, currently on supplements.  4. Anemia.  5. History of carcinoid removal from the stomach four years ago. At that time was diagnosed due to weight loss and GI symptoms.  6. History of urinary tract infection.   PAST SURGICAL  HISTORY:  1. Carcinoid tumor resection. 2. Appendectomy.   ALLERGIES: None.   CURRENT MEDICATIONS:  1. Vitamin D 50,000 international units once every two weeks.  2. Vitamin B12 injections monthly.  3. Tramadol 50 q.6 p.r.n.  4. Synthroid 125 mcg daily.  5. Paxil 10 daily.  6. Multivitamin daily.  7. Caltrate 1/2 tab daily. 8. Calcium 1 tab p.o. daily.  9. Alendronate weekly.   SOCIAL HISTORY: Does not smoke. Does not drink. No drugs.   FAMILY HISTORY: Positive for hypertension.    REVIEW OF SYSTEMS: CONSTITUTIONAL: Denies fevers, fatigue. No weakness. No pain. No weight loss. No weight gain. EYES: No blurred or double vision. No pain. No redness. No inflammation. No glaucoma. ENT: No tinnitus. No ear pain. No hearing loss. No seasonal or year-round allergies. No epistaxis. No nasal discharge. No snoring. RESPIRATORY: No cough. No wheezing. No hemoptysis. No chronic obstructive pulmonary disease. No tuberculosis. CARDIOVASCULAR: Denies any chest pain. No orthopnea. No edema. Does complain of palpitations. No syncope. GASTROINTESTINAL: No nausea, vomiting, diarrhea. No abdominal pain. No hematemesis. No melena. GENITOURINARY: Denies any dysuria, hematuria, renal calculus, frequency. ENDO: Denies any polydipsia, nocturia, or thyroid problems. HEME/LYMPH: Denies anemia, easy bruisability, or bleeding. SKIN: No acne. No rash. No changes mole, hair or skin. MUSCULOSKELETAL: No pain in neck, back, or shoulder. NEUROLOGICAL: No numbness. No cerebrovascular accident. No transient ischemic attack. No seizures. PSYCHIATRIC: No insomnia. No ADD. No OCD. Does have history of anxiety.    PHYSICAL EXAMINATION:  VITAL  SIGNS: Temperature 95.9, pulse 68, respirations 20, blood pressure 131/76, O2 98%.   GENERAL: Patient is a well developed, well nourished female in no acute distress.   HEENT: Head atraumatic, normocephalic. Pupils equally round, reactive to light and accommodation. There is no  conjunctival pallor. No scleral icterus. Pupils equally round, reactive to light and accommodation. Extraocular movements intact. Oropharynx is clear without any exudates.   NECK: There is no thyromegaly. No carotid bruits.   CARDIOVASCULAR: Regular rate and rhythm. No murmurs, rubs, clicks, or gallops. PMI not displaced.   ABDOMEN: Soft, nontender, nondistended. Positive bowel sounds x4.   EXTREMITIES: No clubbing, cyanosis, edema.   SKIN: No rash.   LYMPHATICS: No lymph nodes palpable.   VASCULAR: Good DP, PT pulses.   PSYCHIATRIC: Not anxious or depressed.   NEUROLOGIC: Cranial nerves II through XII grossly intact. No focal deficits.   LABORATORY, DIAGNOSTIC AND RADIOLOGICAL DATA: Evaluation in the ED: Glucose 99, BUN 21, creatinine 1.21, sodium 143, potassium 4.2, chloride 113, CO2 20, anion gap 10. Troponin less than 0.02. CPK 37. TSH 0.55. WBC 4.5, hemoglobin 14.1, platelet count 230. Chest x-ray shows no acute cardiopulmonary processes. EKG showed normal sinus rhythm without any ST-T wave changes.   ASSESSMENT AND PLAN: Patient is an 81 year old presents with palpitations with heart rates in the 140s. Currently heart rate is normal.  1. Palpitations. Differential could be related to arrhythmia such as atrial fibrillation, atrial flutter, supraventricular tachycardia, other arrhythmias. At this time will place patient on telemetry under observation. Her TSH is significantly lower hyperparathyroidism can cause this; in her case it would be over supplementation with her Synthroid. Will hold TSH. Check free T3, free T4. Also to rule out cardiac causes will get echocardiogram of the heart. Check serial cardiac enzymes overnight. Will have cardiology evaluate. The patient may need Holter monitor. Also patient has history of carcinoid tumor so if her symptoms persist this needs to be pursued further. She does have an EGD scheduled for Monday.  2. Hypothyroidism with TSH being low. Will hold  her supplements. Check a free T3 and free T4. Will need lower dose on discharge.  3. Gastroesophageal reflux disease. Currently not on any treatment.  4. Anxiety disorder. Will continue Paxil as taking at home.   TIME SPENT: 35 minutes.   ____________________________ Lacie ScottsShreyang H. Allena KatzPatel, MD shp:cms D: 02/28/2012 16:33:12 ET T: 02/28/2012 17:06:54 ET JOB#: [id number]  cc: Tasha Jindra H. Allena KatzPatel, MD, <Dictator> Jimmie Mollyon C. Candelaria Stagershaplin, MD Charise CarwinSHREYANG H Ayriel Texidor MD ELECTRONICALLY SIGNED 03/04/2012 8:02

## 2015-01-17 NOTE — Discharge Summary (Signed)
PATIENT NAME:  Mandy [name], Mandy [name] MR#:  [id number] DATE OF BIRTH:  1928/03/08  DATE OF ADMISSION:  02/28/2012 DATE OF DISCHARGE:  02/29/2012  HOSPITAL COURSE: [name] is an 81 year old female with known history of hypothyroid, osteoarthritis, gastroesophageal reflux syndrome, and chronic anemia who had been experiencing on and off some palpitations. She had a heart rate that was checked at home in the 130 range. She talked with me in the office, was referred to the Emergency Room and the nurse at the time of checking her in found her pulse at 150 but by the time she was evaluated by the physician her heart rate was in the normal sinus mechanism in the 60's and 70's. She was admitted for further evaluation. She was found in the preliminary evaluation that her TSH was quite low compatible with hyperthyroid. In the office a few months earlier her TSH and T4 were normal balanced. Her electrolytes and enzymes and cardiac studies were all normal including an echocardiogram. She was seen in consultation by Dr. Saralyn Pilar, cardiologist, who felt further evaluation as outpatient would be done but in the interim he wanted to start her on metoprolol succinate 25 mg daily which was started in the hospital.   Blood pressure was running in the 124/78 range. Pulses were in the 70's and 60's and telemetry was sinus mechanism.   Laboratory studies found her free thyroxine elevated at 2.14, her thyroid stimulating hormone depressed at 0.055. Her electrolytes at the time of discharge sugar was 82, BUN 17, creatinine 0.83, sodium 143, potassium 4.3, bicarb 20, eGFR greater than 60 though it was 42 at the time of admission suggesting some dehydration. Her CPK iso and troponins were negative. Her white count was 3900 and hemoglobin was 13.2. She had a free T3 serum that was 2.9 within the normal range.   Echocardiogram left ventricular systolic function was normal at 50% and moderate mitral regurgitation. There was some moderate  tricuspid regurgitation.   CLINICAL IMPRESSIONS:  1. Tachycardia secondary to hyperthyroid. 2. Some dehydration, corrected with hydration. 3. Depression syndrome, stable.  4. Degenerative arthritis, stable.  5. Reflux syndrome, stable.   DISCHARGE INSTRUCTIONS: 1. The patient will be seen in the office in follow-up next week.  2. She is to stay off her thyroid replacement medication and she is being started on metoprolol ER 25 mg succinate daily.   PROGNOSIS: Overall prognosis is guarded.   She has an upper endoscopy scheduled with Dr. Gustavo Lah for further assessment of her previous carcinoid tumor syndrome. They informed Dr. Gustavo Lah they wish to delay that and will be rescheduled later.   Dr. Saralyn Pilar said he would do some further evaluation as an outpatient in the office.   ____________________________ Dianah Field. Mable Fill, MD dcc:drc D: 02/29/2012 15:24:03 ET T: 03/01/2012 10:44:57 ET JOB#: [id number]  cc: Don C. Mable Fill, MD, <Dictator> Tawni Millers MD ELECTRONICALLY SIGNED 03/04/2012 7:34

## 2015-01-25 MED ORDER — SYRINGE WITH NEEDLE 3 ML 25 GAUGE X 1"
3 mL 25 gauge x 1" | INJECTION | Status: DC
Start: 2015-01-25 — End: 2015-11-05

## 2015-01-28 MED ORDER — OXAZEPAM 10 MG CAP
10 mg | ORAL_CAPSULE | ORAL | Status: DC
Start: 2015-01-28 — End: 2015-02-05

## 2015-01-28 MED ORDER — LEVOTHYROXINE 50 MCG TAB
50 mcg | ORAL_TABLET | ORAL | Status: DC
Start: 2015-01-28 — End: 2015-02-05

## 2015-02-03 ENCOUNTER — Ambulatory Visit: Admit: 2015-02-03 | Discharge: 2015-02-03 | Payer: MEDICARE | Primary: Family Medicine

## 2015-02-03 DIAGNOSIS — Z111 Encounter for screening for respiratory tuberculosis: Secondary | ICD-10-CM

## 2015-02-03 NOTE — Progress Notes (Signed)
PPD

## 2015-02-05 ENCOUNTER — Ambulatory Visit: Admit: 2015-02-05 | Discharge: 2015-02-05 | Payer: MEDICARE | Attending: Specialist | Primary: Family Medicine

## 2015-02-05 DIAGNOSIS — E039 Hypothyroidism, unspecified: Secondary | ICD-10-CM

## 2015-02-05 LAB — AMB POC TUBERCULOSIS, INTRADERMAL (SKIN TEST)
PPD: NEGATIVE Negative
mm Induration: 0 mm

## 2015-02-05 MED ORDER — PAROXETINE 20 MG TAB
20 mg | ORAL_TABLET | Freq: Every day | ORAL | Status: AC
Start: 2015-02-05 — End: ?

## 2015-02-05 MED ORDER — L-METHYLFOLATE-VITAMIN B12-ACETYLCYSTEINE 5.6 MG-2 MG-600 MG TAB
600-2-6 mg | Freq: Every day | ORAL | Status: DC
Start: 2015-02-05 — End: 2015-11-05

## 2015-02-05 MED ORDER — PANTOPRAZOLE 40 MG TAB, DELAYED RELEASE
40 mg | ORAL_TABLET | Freq: Every day | ORAL | Status: AC
Start: 2015-02-05 — End: ?

## 2015-02-05 MED ORDER — ESTRADIOL 0.01% (0.1 MG/G) VAGINAL CREAM
0.01 % (0.1 mg/gram) | VAGINAL | Status: AC
Start: 2015-02-05 — End: ?

## 2015-02-05 MED ORDER — CYANOCOBALAMIN 1,000 MCG/ML IJ SOLN
1000 mcg/mL | INTRAMUSCULAR | Status: AC
Start: 2015-02-05 — End: ?

## 2015-02-05 MED ORDER — OXAZEPAM 10 MG CAP
10 mg | ORAL_CAPSULE | ORAL | Status: DC
Start: 2015-02-05 — End: 2015-11-05

## 2015-02-05 MED ORDER — ERGOCALCIFEROL (VITAMIN D2) 50,000 UNIT CAP
1250 mcg (50,000 unit) | ORAL_CAPSULE | ORAL | Status: AC
Start: 2015-02-05 — End: ?

## 2015-02-05 MED ORDER — BUDESONIDE-FORMOTEROL HFA 160 MCG-4.5 MCG/ACTUATION AEROSOL INHALER
Freq: Two times a day (BID) | RESPIRATORY_TRACT | Status: DC
Start: 2015-02-05 — End: 2015-11-05

## 2015-02-05 MED ORDER — LEVOTHYROXINE 50 MCG TAB
50 mcg | ORAL_TABLET | ORAL | Status: AC
Start: 2015-02-05 — End: ?

## 2015-02-05 NOTE — Progress Notes (Signed)
[facility]  Jontavius Rabalais C. Posey Pronto, M.D.  Campbell Riches, M.D.  [place], [place]  Ph No:  [phone]  Fax:  [phone]      CHIEF COMPLAINT: follow-up/nursing home papers         HISTORY OF PRESENT ILLNESS: Ms. Spengler is a 81 y.o. female with past medical history COPD, dementia, DJD, cardiomegaly.  She seen office today accompanied by her daughter stating that mother has not been doing very well.  She has some behavioral issues at home.  Sometimes she'll wander.  They have discussed with nursing home.  They will admit her today.  She would like to get paperwork completed.  She had PPD testing that was reportedly negative.      Allergies   Allergen Reactions   ??? Codeine Nausea Only       Past Medical History   Diagnosis Date   ??? Anemia    ??? Arthritis    ??? Cardiomegaly      d/t age;  asymptomatic   ??? HH (hiatus hernia)    ??? Osteoarthritis    ??? Gastritis    ??? Anemia, pernicious      since childhood   ??? Hiatal hernia    ??? Chronic obstructive pulmonary disease (HCC)      peer xray; asymptomatic; very mild/ no treatment   ??? GERD (gastroesophageal reflux disease)    ??? Thyroid disease      hypothyroid   ??? 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 Depression    ??? Dementia      with short term memory loss-needs assist with ADL's       Past Surgical History   Procedure Laterality Date   ??? Hx endoscopy     ??? Hx tonsillectomy     ??? Hx appendectomy     ??? Hx orthopaedic       right hip replacement   ??? Hx cholecystectomy         History reviewed. No pertinent family history.    History     Social History   ??? Marital Status: WIDOWED     Spouse Name: N/A   ??? Number of Children: N/A   ??? Years of Education: N/A     Occupational History   ??? Not on file.     Social History Main Topics   ??? Smoking status: Never Smoker    ??? Smokeless tobacco: Never Used   ??? Alcohol Use: Yes      Comment: social    ??? Drug Use: No   ??? Sexual Activity: No     Other Topics Concern   ??? Not on file     Social History Narrative           IMMUNIZATIONS:  Immunization History   Administered Date(s) Administered   ??? Influenza Vaccine 07/26/2012, 06/30/2013, 07/21/2014   ??? Pneumococcal Vaccine (Unspecified Type) 11/23/2012   ??? TB Skin Test (PPD) Intradermal 02/03/2015       MEDICATIONS:    Current outpatient prescriptions:   ???  levothyroxine (SYNTHROID) 50 mcg tablet, TAKE ONE TABLET BY MOUTH ONCE DAILY BEFORE  BREAKFAST, Disp: 90 Tab, Rfl: 0  ???  oxazepam (SERAX) 10 mg capsule, TAKE ONE CAPSULE BY MOUTH NIGHTLY AS NEEDED FOR SLEEP FOR ANXIETY, Disp: 30 Cap, Rfl: 1  ???  estradiol (ESTRACE) 0.01 % (0.1 mg/gram) vaginal cream, 1 gram vaginally 3x/week, Disp: 42.5 g, Rfl: 6  ???  L-Methylfolate-B12-Acetylcyst (CEREFOLIN NAC) tablet, Take 1 Tab by mouth daily., Disp: 90 Each, Rfl: 2  ???  pantoprazole (PROTONIX) 40 mg tablet, Take 1 Tab by mouth daily., Disp: 90 Tab, Rfl: 4  ???  PARoxetine (PAXIL) 20 mg tablet, Take 1 Tab by mouth daily., Disp: 90 Tab, Rfl: 3  ???  budesonide-formoterol (SYMBICORT) 160-4.5 mcg/actuation HFA inhaler, Take 2 Puffs by inhalation two (2) times a day., Disp: 1 Inhaler, Rfl: 4  ???  cyanocobalamin (VITAMIN B12) 1,000 mcg/mL injection, 1 mL by IntraMUSCular route every thirty (30) days., Disp: 3 Vial, Rfl: 5  ???  ergocalciferol (ERGOCALCIFEROL) 50,000 unit capsule, TAKE ONE CAPSULE BY MOUTH ONCE A WEEK, Disp: 12 Cap, Rfl: 1  ???  Syringe with Needle, Disp, (BD LUER-LOK SYRINGE) 3 mL 25 gauge x 1" syrg, Inject B12 monthly, Disp: 50 Syringe, Rfl: 0  ???  calcium-cholecalciferol, D3, (CALTRATE 600+D) tablet, Take 1 Tab by mouth daily., Disp: , Rfl:   ???  docusate sodium (COLACE) 100 mg capsule, Take 100 mg by mouth two (2) times a day., Disp: , Rfl:   ???  MULTIVITAMIN WITH MINERALS (ONE-A-DAY 50 PLUS PO), Take  by mouth. Last dose 10/10/13, Disp: , Rfl:       REVIEW OF SYSTEMS:  GENERAL/CONSTITUTIONAL: negative   HEAD, EYES, EARS, NOSE AND THROAT: negative  CARDIOVASCULAR: negative  RESPIRATORY:  negative   GASTROINTESTINAL: negative  GENITOURINARY: negative  MUSCULOSKELETAL: negative  BREASTS: negative  SKIN:  negative  NEUROLOGIC: negative  PSYCHIATRIC: negative  ENDOCRINE:  negative  HEMATOLOGIC/LYMPHATIC:  negative  ALLERGIC/IMMUNOLOGIC:  negative        PHYSICAL EXAM  Vital Signs - BP 138/86 mmHg   Pulse 77   Ht 5\' 7"  (1.702 m)   Wt 151 lb (68.493 kg)   BMI 23.64 kg/m2   Constitutional - alert, well appearing, and in no distress.  Eyes - pupils equal and reactive, extraocular eye movements intact.  Ear, Nose, Mouth, Throat - normal  Examination of oropharynx: oral mucosa moist  Neck - supple, no significant adenopathy.   Respiratory - clear to auscultation, no wheezes, rales or bronchi, symmetric air entry.  Cardiovascular - normal rate, regular rhythm, normal S1, S2, systolic murmur     Gastrointestinal - Abdomen soft, nontender, nondistended, no masses.  Back exam - range of motion, limited  Musculoskeletal -  DJD changes noted.  Skin - normal coloration and turgor, no rashes, no suspicious skin lesions noted.  Neurological - Cranial nerves with notation of any deficits. Motor and sensory exam is intact   Extremities - peripheral pulses normal, no pedal edema, no clubbing or cyanosis  Psychiatric - alert, oriented to person, place, and time. anxious    LABS  Results for orders placed or performed in visit on 02/03/15   AMB POC TUBERCULOSIS, INTRADERMAL (SKIN TEST)   Result Value Ref Range    PPD negative Negative    mm Induration 0 mm             IMPRESSION:    ICD-10-CM ICD-9-CM    1. Acquired hypothyroidism E03.9 244.9    2. Chronic obstructive pulmonary disease with acute exacerbation (HCC) J44.1 491.21    3. Gastroesophageal reflux disease without esophagitis K21.9 530.81    4. B12 deficiency E53.8 266.2    5. Major depressive disorder with single episode, remission status unspecified ([place]) F32.9 296.20  6. Dementia with behavioral disturbance, unspecified dementia type F03.91 294.21    7. Prolapse urethral mucosa N36.8 599.5 estradiol (ESTRACE) 0.01 % (0.1 mg/gram) vaginal cream          PLAN:  I discussed with patient, reviewed the findings refilled her prescription.  Stable with diet.  Continue exercise program.  She will be seen by nursing home doctors.  We will not schedule any appointments here.  Prescriptions given to her.    Zenovia Jarred, MD          Dictated using voice recognition software. Proofread, but unrecognized voice recognition errors may exist.

## 2015-02-25 ENCOUNTER — Encounter: Attending: Specialist | Primary: Family Medicine

## 2015-03-01 ENCOUNTER — Encounter: Attending: Specialist | Primary: Family Medicine

## 2015-11-05 NOTE — Other (Signed)
Spoke with daughter Newt Minion and nurse Latanya Presser at [facility]- requested needed info.  Nurse said she would try to get this.  Said she had 24 pts and did not give assurrance she would do this today.

## 2015-11-05 NOTE — Other (Signed)
Phone assessment done.  Spoke with daughter Newt Minion and faxed instructions to Latanya Presser- nurse with confirmation    All written preoperative instructions faxed to [phone] (with confirmation), including complete NPO after midnight the night before surgery (to include ice chips, hard candy, gum), medications to take morning of surgery with a sip of water: aricept, protonix, thyroid, namenda, place to arrive.    Pt is to be here at time given by GI office (0730). Pt will be transported via auto- daughter Malachy Mood is bringing her .    Staff to bathe pt with an antibacterial soap and then Hibiclens x at least 30 seconds to surgery area if accessible..  Pt is to have all jewelry off.      Staff to call [phone] to cancel prn.    Type: 1A  Labs needed: none

## 2015-11-06 ENCOUNTER — Inpatient Hospital Stay: Primary: Family Medicine

## 2015-11-08 ENCOUNTER — Ambulatory Visit

## 2015-11-08 ENCOUNTER — Encounter

## 2015-11-08 ENCOUNTER — Inpatient Hospital Stay: Payer: MEDICARE

## 2015-11-08 MED ORDER — ONDANSETRON (PF) 4 MG/2 ML INJECTION
4 mg/2 mL | Freq: Once | INTRAMUSCULAR | Status: AC
Start: 2015-11-08 — End: 2015-11-08
  Administered 2015-11-08: 13:00:00 via INTRAVENOUS

## 2015-11-08 MED ORDER — FAMOTIDINE (PF) 20 MG/2 ML IV
20 mg/2 mL | Freq: Once | INTRAVENOUS | Status: AC
Start: 2015-11-08 — End: 2015-11-08
  Administered 2015-11-08: 13:00:00 via INTRAVENOUS

## 2015-11-08 MED ORDER — PROPOFOL 10 MG/ML IV EMUL
10 mg/mL | INTRAVENOUS | Status: DC | PRN
Start: 2015-11-08 — End: 2015-11-08
  Administered 2015-11-08: 16:00:00 via INTRAVENOUS

## 2015-11-08 MED ORDER — LACTATED RINGERS IV
INTRAVENOUS | Status: DC
Start: 2015-11-08 — End: 2015-11-08
  Administered 2015-11-08 (×2): via INTRAVENOUS

## 2015-11-08 MED ORDER — SODIUM CITRATE-CITRIC ACID 500 MG-334 MG/5 ML ORAL SOLN
500-334 mg/5 mL | ORAL | Status: AC
Start: 2015-11-08 — End: 2015-11-08
  Administered 2015-11-08: 14:00:00 via ORAL

## 2015-11-08 MED FILL — FAMOTIDINE (PF) 20 MG/2 ML IV: 20 mg/2 mL | INTRAVENOUS | Qty: 2

## 2015-11-08 MED FILL — SODIUM CITRATE-CITRIC ACID 500 MG-334 MG/5 ML ORAL SOLN: 500-334 mg/5 mL | ORAL | Qty: 30

## 2015-11-08 MED FILL — ONDANSETRON (PF) 4 MG/2 ML INJECTION: 4 mg/2 mL | INTRAMUSCULAR | Qty: 2

## 2015-11-08 NOTE — Progress Notes (Signed)
Zofran 4 mg IV and Pepcid 20 mg IV given in ENDO pre-op.

## 2015-11-08 NOTE — Procedures (Signed)
Esophagogastroduodenoscopy    DATE of PROCEDURE: 11/08/2015    MEDICATIONS ADMINISTERED: MAC    INSTRUMENT: GIF    PROCEDURE:  After obtaining informed consent, the patient was placed in the left lateral position and sedated.  The endoscope was advanced under direct vision without difficulty.  The esophagus, stomach (including retroflexed views) and duodenum were evaluated.  The patient was taken to the recovery area in stable condition.    FINDINGS:  ESOPHAGUS:  There is a 3cm hiatal hernia without associated esophageal stricture or esophagitis.    STOMACH:  Large amount of yellow liquid noted immediately upon entry into stomach.  Aggressively suctioned and then stomach examined.  There were 2 small <94mm polyps noted resembling fundic gland polyps.  No evidence of ulcer, tumor, or masses.  Biopsies taken for h pylori  DUODENUM:  Normal - no evidence of distal obstruction    Estimated blood loss: 0-minimal     PLAN:  1.  F/up path  2. Consider GES and more aggressive correction of constipation  3. F/up with Dr Imelda Pillow in 2-4 weeks.    Grant Ruts, MD  [facility] [place]

## 2015-11-08 NOTE — Anesthesia Pre-Procedure Evaluation (Signed)
Anesthetic History   No history of anesthetic complications            Review of Systems / Medical History  Patient summary reviewed and pertinent labs reviewed    Pulmonary              Pertinent negatives: COPD: Family denies, was incidental finding on Xray, but they have since seen a pulmonologist and pt does not have COPD.  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 Pt has been able realign in PACU by herself.           Physical Exam    Airway  Mallampati: I  TM Distance: 4 - 6 cm  Neck ROM: normal range of motion   Mouth opening: Normal     Cardiovascular    Rhythm: regular  Rate: normal         Dental  No notable dental hx       Pulmonary  Breath sounds clear to auscultation               Abdominal         Other Findings            Anesthetic Plan    ASA: 2  Anesthesia type: total IV anesthesia            Anesthetic plan and risks discussed with: Patient and Son / Daughter      Daughter is [employer]

## 2015-11-08 NOTE — Progress Notes (Signed)
Notified Dr. Thurmond Butts that pt is vomiting at this time

## 2015-11-08 NOTE — Progress Notes (Signed)
bicitra given per anesthesia order

## 2015-11-08 NOTE — Anesthesia Post-Procedure Evaluation (Signed)
Post-Anesthesia Evaluation and Assessment    Patient: Nyemiah Joy MRN: [id number]  SSN: [id number]    Date of Birth: [birth year]  Age: 82 y.o.  Sex: female       Cardiovascular Function/Vital Signs  Visit Vitals   ??? BP 163/89   ??? Pulse 66   ??? Temp 36 ??C (96.8 ??F)   ??? Resp 16   ??? Ht 5\' 7"  (1.702 m)   ??? Wt 54 kg (119 lb)   ??? SpO2 98%   ??? Breastfeeding No   ??? BMI 18.64 kg/m2       Patient is status post total IV anesthesia anesthesia for Procedure(s):  ESOPHAGOGASTRODUODENOSCOPY (EGD)/ BMI=20  ESOPHAGOGASTRODUODENAL (EGD) BIOPSY.    Nausea/Vomiting: None    Postoperative hydration reviewed and adequate.    Pain:  Pain Scale 1: Numeric (0 - 10) (11/08/15 1120)  Pain Intensity 1: 0 (11/08/15 1133)   Managed    Neurological Status:       At baseline    Mental Status and Level of Consciousness: Arousable    Pulmonary Status:   O2 Device: Room air (11/08/15 1120)   Adequate oxygenation and airway patent    Complications related to anesthesia: None    Post-anesthesia assessment completed. No concerns    Signed By: Bobbye [name], MD     November 08, 2015

## 2015-11-08 NOTE — Progress Notes (Signed)
Pt to dc via wc by lisa rn. Pt stable at time of dc. Daughter driving

## 2015-11-08 NOTE — H&P (Signed)
Gastroenterology Outpatient History and Physical    Patient: Jo Simmons    Physician: Derrill Kay, MD    Vital Signs: Blood pressure 168/85, pulse 78, temperature 97.4 ??F (36.3 ??C), resp. rate 14, height 5\' 7"  (1.702 m), weight 54 kg (119 lb), SpO2 95 %, not currently breastfeeding.    Allergies:   Allergies   Allergen Reactions   ??? Codeine Nausea Only       Chief Complaint: weight loss, N/V, dysphagia    History of Present Illness:   Jo Simmons is an 82 year old female, who presents for follow up of choledocholithiasis, nausea, vomiting, dysphagia, and weight loss. She was doing much better after initiation of Zofran; therefore, we elected to defer an EGD. Marland Kitchen However, her family states she has lost approximately 9.1 pounds in the last week. Her appetite has diminished again, and she has had a recurrence of nausea and  vomiting, even taking Zofran.  Her family is concerned to schedule an EGD because her jaw dislocates every time she has an  EGD. She is unable to complete the prep for a colonoscopy; therefore, they decline that option. She has seen a Pulmonologist for the lung nodules, and her daughters relate that these were attributed to  scar tissue. They are unsure about  her  bowel pattern  because of nursing home records. They are unaware of  any melena or hematochezia. She has complained of occasional LLQ pain, "like menstrual pain". When the patient is asked, she denies having any of these symptoms. She currently takes Colace and Miralax. Her labs from 08/2015, revealed a normal CBC and lipase. Her bilirubin was 1.6, and glucose was 115. Bilirubin fractionation revealed it to be mostly indirect type. A subsequent retic. count was elevated, 2.7. Her cortisol level, which was drawn in the afternoon instead of early AM, was 20.6. Her pelvic U/S was technically limited, but  no pertinent abnormalities were seen. Her family remains concerned about her anxiety, feeling that it may be the cause for her  symptoms; they question whether increasing her Paxil dose might be beneficial.      Justification for Procedure: eval for PUD as well as gastric mass    History:  Past Medical History   Diagnosis Date   ??? Anemia    ??? Anemia, pernicious      since childhood   ??? Anxiety    ??? Arthritis    ??? Cardiomegaly      d/t age;  asymptomatic   ??? Chronic obstructive pulmonary disease (HCC)      peer xray; asymptomatic; very mild/ no treatment   ??? Dementia      with short term memory loss-needs assist with ADL's   ??? Depression    ??? Gastritis    ??? GERD (gastroesophageal reflux disease)    ??? HH (hiatus hernia)    ??? Hiatal hernia    ??? Hypothyroidism    ??? 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 Insomnia    ??? Osteoarthritis    ??? 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 Thyroid disease      hypothyroid   ??? Weight loss, unintentional 11/05/2015     loss of 34 lbs since 01/2015 (153lbs to 119lbs)- N&V X several weeks- lost 9 lbs in past week-per daughter      Past Surgical History   Procedure Laterality Date   ??? Hx endoscopy     ???  Hx tonsillectomy     ??? Hx appendectomy     ??? Hx orthopaedic       right hip replacement   ??? Hx cholecystectomy        Social History     Social History   ??? Marital status: WIDOWED     Spouse name: N/A   ??? Number of children: N/A   ??? Years of education: N/A     Social History Main Topics   ??? Smoking status: Never Smoker   ??? Smokeless tobacco: Never Used   ??? Alcohol use Yes      Comment: social   ??? Drug use: No   ??? Sexual activity: No     Other Topics Concern   ??? None     Social History Narrative    History reviewed. No pertinent family history.    Medications:   Prior to Admission medications    Medication Sig Start Date End Date Taking? Authorizing Provider   ondansetron hcl (ZOFRAN, AS HYDROCHLORIDE,) 4 mg tablet Take 4 mg by mouth See Admin Instructions. Give 1 30 min AC lunch and dinner   Yes Historical Provider    donepezil (ARICEPT) 5 mg tablet Take  by mouth daily. Takes at 0900   Yes Historical Provider   memantine ER (NAMENDA XR) 14 mg capsule Take 14 mg by mouth daily. Takes at 0900   Yes Historical Provider   polyethylene glycol (MIRALAX) 17 gram packet Take 17 g by mouth daily as needed.   Yes Historical Provider   levothyroxine (SYNTHROID) 50 mcg tablet TAKE ONE TABLET BY MOUTH ONCE DAILY BEFORE  BREAKFAST 02/05/15  Yes Zenovia Jarred, MD   pantoprazole (PROTONIX) 40 mg tablet Take 1 Tab by mouth daily.  Patient taking differently: Take 40 mg by mouth Daily (before breakfast). 02/05/15  Yes Zenovia Jarred, MD   PARoxetine (PAXIL) 20 mg tablet Take 1 Tab by mouth daily.  Patient taking differently: Take 20 mg by mouth every evening. 02/05/15  Yes Zenovia Jarred, MD   cyanocobalamin (VITAMIN B12) 1,000 mcg/mL injection 1 mL by IntraMUSCular route every thirty (30) days. 02/05/15  Yes Zenovia Jarred, MD   ergocalciferol (ERGOCALCIFEROL) 50,000 unit capsule TAKE ONE CAPSULE BY MOUTH ONCE A WEEK  Patient taking differently: every Wednesday. TAKE ONE CAPSULE BY MOUTH ONCE A WEEK 02/05/15  Yes Zenovia Jarred, MD   calcium-cholecalciferol, D3, (CALTRATE 600+D) tablet Take 1 Tab by mouth daily.   Yes Historical Provider   MULTIVITAMIN WITH MINERALS (ONE-A-DAY 50 PLUS PO) Take  by mouth daily. Last dose 11/05/15   Yes Historical Provider   estradiol (ESTRACE) 0.01 % (0.1 mg/gram) vaginal cream 1 gram vaginally 3x/week 02/05/15   Zenovia Jarred, MD       Physical Exam:   General: no distress   HEENT: Head: Normocephalic, no lesions, without obvious abnormality.   Heart: regular rate and rhythm, S1, S2 normal, no murmur, click, rub or gallop   Lungs: chest clear, no wheezing, rales, normal symmetric air entry   Abdominal: Bowel sounds are normal, liver is not enlarged, spleen is not enlarged   Neurological: Grossly normal   Extremities: extremities normal, atraumatic, no cyanosis or edema      Findings/Diagnosis: eval for PUD, masses, stricture    Plan of Care/Planned Procedure: EGD    Signed By: Derrill Kay, MD     November 08, 2015

## 2015-11-08 NOTE — Procedures (Signed)
Esophagogastroduodenoscopy    DATE of PROCEDURE: 11/08/2015    MEDICATIONS ADMINISTERED: MAC    INSTRUMENT: GIF    PROCEDURE:  After obtaining informed consent, the patient was placed in the left lateral position and sedated.  The endoscope was advanced under direct vision without difficulty.  The esophagus, stomach (including retroflexed views) and duodenum were evaluated.  The patient was taken to the recovery area in stable condition.    FINDINGS:  ESOPHAGUS:  There is a 3cm hiatal hernia without associated esophageal stricture or esophagitis.    STOMACH:  Large amount of yellow liquid noted immediately upon entry into stomach.  Aggressively suctioned and then stomach examined.  There were 2 small <64mm polyps noted resembling fundic gland polyps.  No evidence of ulcer, tumor, or masses.  Biopsies taken for h pylori  DUODENUM:  Normal - no evidence of distal obstruction    Estimated blood loss: 0-minimal     PLAN:  1.  F/up path  2. Consider GES and more aggressive correction of constipation  3. F/up with Dr Imelda Pillow in 2-4 weeks.    Grant Ruts, MD  [facility] [place]

## 2015-11-09 MED FILL — PROPOFOL 10 MG/ML IV EMUL: 10 mg/mL | INTRAVENOUS | Qty: 44.3

## 2015-11-11 ENCOUNTER — Inpatient Hospital Stay: Admit: 2015-11-11 | Payer: MEDICARE | Attending: Gastroenterology | Primary: Family Medicine

## 2015-11-11 DIAGNOSIS — R112 Nausea with vomiting, unspecified: Secondary | ICD-10-CM

## 2015-11-11 MED ORDER — NUCLEAR MEDICINE ISOTOPE
Freq: Once | Status: AC
Start: 2015-11-11 — End: 2015-11-11
  Administered 2015-11-11: 14:00:00

## 2015-12-31 ENCOUNTER — Encounter: Attending: Women's Health | Primary: Family Medicine

## 2018-09-25 DEATH — deceased
